# Patient Record
Sex: Female | Born: 1960 | Race: Black or African American | Hispanic: No | Marital: Single | Smoking: Former smoker
Health system: Southern US, Community
[De-identification: ages and names within clinical notes are randomized; demographics above are authoritative.]

## PROBLEM LIST (undated history)

## (undated) DIAGNOSIS — I10 Essential (primary) hypertension: Secondary | ICD-10-CM

## (undated) DIAGNOSIS — J42 Unspecified chronic bronchitis: Secondary | ICD-10-CM

## (undated) DIAGNOSIS — F32A Depression, unspecified: Secondary | ICD-10-CM

## (undated) DIAGNOSIS — E049 Nontoxic goiter, unspecified: Secondary | ICD-10-CM

## (undated) DIAGNOSIS — E039 Hypothyroidism, unspecified: Secondary | ICD-10-CM

## (undated) DIAGNOSIS — D251 Intramural leiomyoma of uterus: Secondary | ICD-10-CM

## (undated) DIAGNOSIS — Z8679 Personal history of other diseases of the circulatory system: Secondary | ICD-10-CM

## (undated) DIAGNOSIS — M8580 Other specified disorders of bone density and structure, unspecified site: Secondary | ICD-10-CM

## (undated) DIAGNOSIS — J189 Pneumonia, unspecified organism: Secondary | ICD-10-CM

## (undated) DIAGNOSIS — F419 Anxiety disorder, unspecified: Secondary | ICD-10-CM

## (undated) DIAGNOSIS — R0600 Dyspnea, unspecified: Secondary | ICD-10-CM

## (undated) DIAGNOSIS — E785 Hyperlipidemia, unspecified: Secondary | ICD-10-CM

## (undated) DIAGNOSIS — E079 Disorder of thyroid, unspecified: Secondary | ICD-10-CM

## (undated) HISTORY — DX: Unspecified chronic bronchitis: J42

## (undated) HISTORY — PX: ABDOMINAL HYSTERECTOMY: SHX81

## (undated) HISTORY — DX: Disorder of thyroid, unspecified: E07.9

## (undated) HISTORY — PX: REDUCTION MAMMAPLASTY: SUR839

## (undated) HISTORY — DX: Hyperlipidemia, unspecified: E78.5

## (undated) HISTORY — DX: Intramural leiomyoma of uterus: D25.1

## (undated) HISTORY — DX: Other specified disorders of bone density and structure, unspecified site: M85.80

## (undated) HISTORY — PX: TUBAL LIGATION: SHX77

## (undated) HISTORY — DX: Personal history of other diseases of the circulatory system: Z86.79

## (undated) HISTORY — DX: Nontoxic goiter, unspecified: E04.9

## (undated) HISTORY — PX: BUNIONECTOMY: SHX129

---
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
## 2008-10-21 HISTORY — PX: CARDIAC ELECTROPHYSIOLOGY STUDY AND ABLATION: SHX1294

## 2017-01-20 HISTORY — PX: LAPAROSCOPIC TOTAL HYSTERECTOMY: SUR800

## 2017-04-24 ENCOUNTER — Ambulatory Visit (INDEPENDENT_AMBULATORY_CARE_PROVIDER_SITE_OTHER): Payer: BLUE CROSS/BLUE SHIELD | Admitting: Endocrinology

## 2017-04-24 ENCOUNTER — Encounter: Payer: Self-pay | Admitting: Endocrinology

## 2017-04-24 DIAGNOSIS — Z0000 Encounter for general adult medical examination without abnormal findings: Secondary | ICD-10-CM | POA: Insufficient documentation

## 2017-04-24 DIAGNOSIS — E049 Nontoxic goiter, unspecified: Secondary | ICD-10-CM | POA: Insufficient documentation

## 2017-04-24 DIAGNOSIS — R1900 Intra-abdominal and pelvic swelling, mass and lump, unspecified site: Secondary | ICD-10-CM

## 2017-04-24 DIAGNOSIS — N390 Urinary tract infection, site not specified: Secondary | ICD-10-CM

## 2017-04-24 LAB — HEPATIC FUNCTION PANEL
ALBUMIN: 3.8 g/dL (ref 3.5–5.2)
ALT: 10 U/L (ref 0–35)
AST: 17 U/L (ref 0–37)
Alkaline Phosphatase: 92 U/L (ref 39–117)
BILIRUBIN TOTAL: 0.4 mg/dL (ref 0.2–1.2)
Bilirubin, Direct: 0 mg/dL (ref 0.0–0.3)
TOTAL PROTEIN: 6.9 g/dL (ref 6.0–8.3)

## 2017-04-24 LAB — CBC WITH DIFFERENTIAL/PLATELET
BASOS ABS: 0 10*3/uL (ref 0.0–0.1)
BASOS PCT: 0.7 % (ref 0.0–3.0)
EOS ABS: 0.2 10*3/uL (ref 0.0–0.7)
Eosinophils Relative: 4.5 % (ref 0.0–5.0)
HCT: 42 % (ref 36.0–46.0)
HEMOGLOBIN: 14.5 g/dL (ref 12.0–15.0)
LYMPHS PCT: 38.2 % (ref 12.0–46.0)
Lymphs Abs: 1.8 10*3/uL (ref 0.7–4.0)
MCHC: 34.5 g/dL (ref 30.0–36.0)
MCV: 96.9 fl (ref 78.0–100.0)
MONO ABS: 0.3 10*3/uL (ref 0.1–1.0)
Monocytes Relative: 7.2 % (ref 3.0–12.0)
Neutro Abs: 2.3 10*3/uL (ref 1.4–7.7)
Neutrophils Relative %: 49.4 % (ref 43.0–77.0)
Platelets: 177 10*3/uL (ref 150.0–400.0)
RBC: 4.33 Mil/uL (ref 3.87–5.11)
RDW: 12.8 % (ref 11.5–15.5)
WBC: 4.8 10*3/uL (ref 4.0–10.5)

## 2017-04-24 LAB — URINALYSIS, ROUTINE W REFLEX MICROSCOPIC
Nitrite: NEGATIVE
URINE GLUCOSE: NEGATIVE
UROBILINOGEN UA: 1 (ref 0.0–1.0)
pH: 6 (ref 5.0–8.0)

## 2017-04-24 LAB — BASIC METABOLIC PANEL
BUN: 16 mg/dL (ref 6–23)
CALCIUM: 9.6 mg/dL (ref 8.4–10.5)
CO2: 30 meq/L (ref 19–32)
CREATININE: 0.89 mg/dL (ref 0.40–1.20)
Chloride: 107 mEq/L (ref 96–112)
GFR: 84.91 mL/min (ref >60.00)
Glucose, Bld: 107 mg/dL — ABNORMAL HIGH (ref 70–99)
Potassium: 4.2 mEq/L (ref 3.5–5.1)
Sodium: 143 mEq/L (ref 135–145)

## 2017-04-24 LAB — LIPID PANEL
CHOL/HDL RATIO: 4
Cholesterol: 202 mg/dL — ABNORMAL HIGH (ref 0–200)
HDL: 54.2 mg/dL (ref >39.00)
LDL Cholesterol: 129 mg/dL — ABNORMAL HIGH (ref 0–99)
NONHDL: 147.84
Triglycerides: 93 mg/dL (ref 0.0–149.0)
VLDL: 18.6 mg/dL (ref 0.0–40.0)

## 2017-04-24 LAB — TSH: TSH: 0.82 u[IU]/mL (ref 0.35–4.50)

## 2017-04-24 NOTE — Patient Instructions (Addendum)
Let's check a CT scan of the abdomen, and an ultrasound of the thyroid.  you will receive a phone call, about a day and time for an appointment.   blood tests are requested for you today.  We'll let you know about the results.   please call [phone] [place] Comment [place] Ropes), to get an appointment with a new primary doctor.   Please sign a release of information, for the past treatment at [facility].

## 2017-04-24 NOTE — Progress Notes (Signed)
Subjective:    Patient ID: Ariel [name], female    DOB: 1960/12/08, 56 y.o.   MRN: [id number]  HPI Pt is self-referred, for goiter.  Pt says this was dx'ed in 2007, when pt lived in [place].  she has no h/o XRT or surgery to the neck.  She was rx'ed synthroid, but has not recently taken.  She has slight swelling at the ant neck, and assoc fatigue.   No past medical history on file.  No past surgical history on file.  Social History   Social History  . Marital status: Single    Spouse name: N/A  . Number of children: N/A  . Years of education: N/A   Occupational History  . Not on file.   Social History Main Topics  . Smoking status: Current Some Day Smoker  . Smokeless tobacco: Never Used  . Alcohol use Yes  . Drug use: Unknown  . Sexual activity: Not on file   Other Topics Concern  . Not on file   Social History Narrative  . No narrative on file    No current outpatient prescriptions on file prior to visit.   No current facility-administered medications on file prior to visit.     No Known Allergies  Family History  Problem Relation Age of Onset  . Thyroid disease Maternal Aunt     BP 132/86   Pulse 94   Ht 5' 3.75" (1.619 m)   Wt 147 lb (66.7 kg)   SpO2 96%   BMI 25.43 kg/m    Review of Systems Denies weight change, neck pain, diplopia, chest pain, sob, cough, diarrhea, itching, flushing, edepression, cold intolerance, numbness, and rhinorrhea.   She has intermitt headache, (solid=liquid) dysphagia, easy bruising, hoarseness, weight gain, and mild depression    Objective:   Physical Exam VS: see vs page GEN: no distress HEAD: head: no deformity eyes: no periorbital swelling, no proptosis external nose and ears are normal mouth: no lesion seen NECK: thyroid is 5-10 times normal size (L>R) CHEST WALL: no deformity LUNGS: clear to auscultation CV: reg rate and rhythm, no murmur ABD: large mass, nontender, occupying most of the abdomen.     MUSCULOSKELETAL: muscle bulk and strength are grossly normal.  no obvious joint swelling.  gait is normal and steady EXTEMITIES: no deformity.  no ulcer on the feet.  feet are of normal color and temp.  no edema PULSES: dorsalis pedis intact bilat.  no carotid bruit NEURO:  cn 2-12 grossly intact.   readily moves all 4's.  sensation is intact to touch on the feet SKIN:  Normal texture and temperature.  No rash or suspicious lesion is visible.   NODES:  None palpable at the neck PSYCH: alert, well-oriented.  Does not appear anxious nor depressed.   Lab Results  Component Value Date   TSH 0.82 04/24/2017   UA pos for UTI.  Lab Results  Component Value Date   CHOL 202 (H) 04/24/2017   HDL 54.20 04/24/2017   LDLCALC 129 (H) 04/24/2017   TRIG 93.0 04/24/2017   CHOLHDL 4 04/24/2017      Assessment & Plan:  abd mass, new, uncertain etiology.  severe risk of malignancy.   Goiter, new to me: euthyroid.  I told pt that she does not need to resume synthroid.   UTI: ? relationship to abd mass.   Dyslipidemia: diet is advised.   Patient Instructions  Let's check a CT scan of the abdomen, and an ultrasound  of the thyroid.  you will receive a phone call, about a day and time for an appointment.   blood tests are requested for you today.  We'll let you know about the results.   please call [phone] [place] Comment [place] Ropes), to get an appointment with a new primary doctor.   Please sign a release of information, for the past treatment at [facility].

## 2017-05-08 ENCOUNTER — Other Ambulatory Visit: Payer: Self-pay | Admitting: Endocrinology

## 2017-05-08 ENCOUNTER — Telehealth: Payer: Self-pay

## 2017-05-08 ENCOUNTER — Telehealth: Payer: Self-pay | Admitting: Endocrinology

## 2017-05-08 ENCOUNTER — Ambulatory Visit
Admission: RE | Admit: 2017-05-08 | Discharge: 2017-05-08 | Disposition: A | Discharge status: Home / Self Care | Payer: BLUE CROSS/BLUE SHIELD | Source: Ambulatory Visit | Attending: Endocrinology | Admitting: Endocrinology

## 2017-05-08 DIAGNOSIS — R1900 Intra-abdominal and pelvic swelling, mass and lump, unspecified site: Secondary | ICD-10-CM

## 2017-05-08 DIAGNOSIS — E049 Nontoxic goiter, unspecified: Secondary | ICD-10-CM

## 2017-05-08 MED ORDER — IOPAMIDOL (ISOVUE-300) INJECTION 61%
100.0000 mL | Freq: Once | INTRAVENOUS | Status: AC | PRN
  Administered 2017-05-08: 100 mL via INTRAVENOUS

## 2017-05-08 NOTE — Telephone Encounter (Signed)
please call patient: We got CT results.  The uterus is enlarged, but I can't tell for sure.  Please see a gynecology specialist.  you will receive a phone call, about a day and time for an appointment

## 2017-05-08 NOTE — Telephone Encounter (Signed)
Rad Management consultant reached and call report from CT abdomen and pelvis submitted to Dr. Loanne Drilling to review.

## 2017-05-08 NOTE — Telephone Encounter (Signed)
Judson Roch called in a report about the CT abdomen and pelvis. Results: Markedly enlarged uterus along superior and anterior aspects of the uterus. There is a mix attenuation mass measuring 12.2 x 13.8 x 9.5 cm. There is a more uniformly solid mass in the mid- uterus measuring 5.4 x 6.3 x 6.8 cm. The lesions most likely represent leiomyomas. All the more complex superior mass could have a degree of sarcomatous degeneration.

## 2017-05-08 NOTE — Telephone Encounter (Signed)
[place], rad Environmental consultant, needs a call back to go over imaging results.  Thank you,  -LL

## 2017-05-08 NOTE — Telephone Encounter (Signed)
Patient notified of message and voiced understanding.  

## 2017-05-28 NOTE — Progress Notes (Signed)
HPI:  Ariel [name] is here to establish care. Reports she has not had a primary care doctor in several years as has not had insurance.   Has the following chronic problems that require follow up and concerns today:  Hypertension and lower extremity edema: -Chronic -Reports was on hydrochlorothiazide for years, then ran out of insurance and has not taken this medicine in several years -Denies chest pain, shortness of breath, headaches or vision changes  Tobacco use: -Has smoked since she was 56 years old -4 cigarettes per day for many years -Is interested in quitting -Feels can quit -Likes to smoke when she is stressed -Denies shortness of breath, cough hemoptysis or recent lung issues  Stress/Cognitive fog: -for several months to a year or more -mild depressed mood, irritability, emotional lability, cog fog -enjoys her job but stressful -denies: SI, hallucinations, panic, manic symptoms  Thyroid goiter: -seeing Dr. Loanne Drilling for this  Mild Hyperlipidemia/mild hyperglycemia: -on labs with Dr. Loanne Drilling -Wants to work on lifestyle changes - admits a very poor diet and no regular exercise currently  Enlarged Uterus: -on CT done with Dr. 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 Chronic bronchitis ([place])   . Goiter   . History of hypertension   . Hyperlipidemia   . Thyroid disease     Past Surgical History:  Procedure Laterality Date  .  CARDIAC ELECTROPHYSIOLOGY STUDY AND ABLATION    . TONSILLECTOMY  1967  . TUBAL LIGATION     X2, REVERSAL     Family History  Problem Relation Age of Onset  . Thyroid disease Maternal Aunt   . Hypertension Mother   . Renal Disease Mother   . Diabetes Father   . Hypertension Father   . Renal Disease Father   . Stroke Maternal Grandmother   . Cancer Paternal Grandmother        ?    Social History   Social History  . Marital status: Single    Spouse name: N/A  . Number of children: N/A  . Years of education: N/A   Social History Main Topics  . Smoking status: Current Every Day Smoker  . Smokeless tobacco: Never Used     Comment: 4 CIGS A DAY  . Alcohol use Yes     Comment: OCC  . Drug use: Unknown  . Sexual activity: Yes    Partners: Male     Comment: 1ST INTERCOURSE- 27, PARTNERS- 52, CURRENT PARTNER- 3.5 YRS    Other Topics Concern  . 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 Christian, no church currenty      Lifestyle: "poor diet"; no exercise        Current Outpatient Prescriptions:  .  hydrochlorothiazide (HYDRODIURIL) 25 MG tablet, Take 1 tablet (25 mg total) by mouth daily., Disp: 90 tablet, Rfl: 3  EXAM:  Vitals:   05/29/17 1320  BP: (!) 142/92  Pulse: 85  Temp: 98.4 F (36.9 C)    Body mass index is 26 kg/m.  GENERAL: vitals reviewed and listed above, alert, oriented, appears well hydrated and in no acute distress  HEENT: atraumatic, conjunttiva clear, no obvious abnormalities on inspection of external nose and ears  NECK: no obvious masses on inspection  LUNGS: clear to auscultation bilaterally, no wheezes, rales or rhonchi, good air movement  CV: HRRR, no peripheral edema  MS: moves all extremities without noticeable abnormality  PSYCH: pleasant and cooperative, no obvious depression or anxiety  ASSESSMENT AND PLAN:  Discussed the  following assessment and plan:  Hypertension, unspecified type  Mood disorder ([place])  Hyperlipidemia, unspecified hyperlipidemia type  Hyperglycemia - Plan: Hemoglobin A1c  Tobacco use  Abdominal mass, unspecified abdominal location  Goiter  Aortic atherosclerosis ([facility]) -We reviewed the PMH, PSH, FH, SH, Meds and Allergies. -We provided refills for any medications we will prescribe as needed. -We addressed current concerns per orders and patient instructions.Opted to try cognitive behavioral therapy for the mood issues, restart hydrochlorothiazide for the blood pressure, work on smoking cessation, healthy diet and regular exercise. Follow-up in 2-4 weeks. -We have asked for records for pertinent exams, studies, vaccines and notes from previous providers. -We have advised patient to follow up per instructions below. -We will plan to check a hemoglobin A1c and BMP at her follow-up. Also will need to discuss a statin and aspirin given her history of aortic atherosclerosis - noted as incidental finding on recent CT scan. -We discussed that we will need to do a preventive visit and address her preventive care measures once we taking care of her acute issues. -Patient advised to return or notify a doctor immediately if symptoms worsen or persist or new concerns arise.  Patient Instructions  Before you leave: -follow up: 2-3 weeks   Call today to set up therapy for the emotional and cognitive concerns.  Start the Hydrochlorothiazide 25 mg every morning.  We will plan to recheck kidney function and diabetes lab and discuss other treatment options for cholesterol at your next visit.  Please quit smoking, eat a healthy diet and get regular exercise.   We recommend the following healthy lifestyle for LIFE: 1) Small portions.   Tip: eat off of a salad plate instead of a dinner plate.  Tip: It is ok to feel hungry after a meal - that likely means you ate an appropriate portion.  Tip: if  you need more or a snack choose fruits, veggies and/or a handful of nuts or seeds.  2) Eat a healthy clean diet.   TRY TO EAT: -at least 5-7 servings of low sugar vegetables per day (not corn, potatoes or bananas.) -berries are the best choice if you wish to eat fruit.   -lean meets (fish, chicken or [place] breasts) -vegan proteins for some meals - beans or tofu, whole grains, nuts and seeds -Replace bad fats with good fats - good fats include: fish, nuts and seeds, canola oil, olive oil -small amounts of low fat or non fat dairy -small amounts of100 % whole grains - check the lables  AVOID: -SUGAR, sweets, anything with added sugar, corn syrup or sweeteners -if you must have a sweetener, small amounts of stevia may be best -sweetened beverages -simple starches (rice, bread, potatoes, pasta, chips, etc - small amounts of 100% whole grains are ok) -red meat, pork, butter -fried foods, fast food, processed food, excessive dairy, eggs and coconut.  3)Get  at least 150 minutes of sweaty aerobic exercise per week.  4)Reduce stress - consider counseling, meditation and relaxation to balance other aspects of your life.      Colin Benton R.

## 2017-05-29 ENCOUNTER — Ambulatory Visit (INDEPENDENT_AMBULATORY_CARE_PROVIDER_SITE_OTHER): Payer: BLUE CROSS/BLUE SHIELD | Admitting: Family Medicine

## 2017-05-29 ENCOUNTER — Encounter: Payer: Self-pay | Admitting: Obstetrics & Gynecology

## 2017-05-29 ENCOUNTER — Encounter: Payer: Self-pay | Admitting: Family Medicine

## 2017-05-29 ENCOUNTER — Ambulatory Visit (INDEPENDENT_AMBULATORY_CARE_PROVIDER_SITE_OTHER): Payer: BLUE CROSS/BLUE SHIELD | Admitting: Obstetrics & Gynecology

## 2017-05-29 VITALS — Ht 63.25 in | Wt 148.0 lb

## 2017-05-29 VITALS — BP 142/92 | HR 85 | Temp 98.4°F | Ht 63.5 in | Wt 149.1 lb

## 2017-05-29 DIAGNOSIS — E049 Nontoxic goiter, unspecified: Secondary | ICD-10-CM | POA: Diagnosis not present

## 2017-05-29 DIAGNOSIS — Z113 Encounter for screening for infections with a predominantly sexual mode of transmission: Secondary | ICD-10-CM

## 2017-05-29 DIAGNOSIS — N898 Other specified noninflammatory disorders of vagina: Secondary | ICD-10-CM

## 2017-05-29 DIAGNOSIS — R1900 Intra-abdominal and pelvic swelling, mass and lump, unspecified site: Secondary | ICD-10-CM | POA: Diagnosis not present

## 2017-05-29 DIAGNOSIS — R1909 Other intra-abdominal and pelvic swelling, mass and lump: Secondary | ICD-10-CM | POA: Diagnosis not present

## 2017-05-29 DIAGNOSIS — Z720 Tobacco use: Secondary | ICD-10-CM

## 2017-05-29 DIAGNOSIS — I10 Essential (primary) hypertension: Secondary | ICD-10-CM | POA: Insufficient documentation

## 2017-05-29 DIAGNOSIS — Z780 Asymptomatic menopausal state: Secondary | ICD-10-CM

## 2017-05-29 DIAGNOSIS — R739 Hyperglycemia, unspecified: Secondary | ICD-10-CM | POA: Insufficient documentation

## 2017-05-29 DIAGNOSIS — N852 Hypertrophy of uterus: Secondary | ICD-10-CM | POA: Diagnosis not present

## 2017-05-29 DIAGNOSIS — E785 Hyperlipidemia, unspecified: Secondary | ICD-10-CM | POA: Insufficient documentation

## 2017-05-29 DIAGNOSIS — Z01411 Encounter for gynecological examination (general) (routine) with abnormal findings: Secondary | ICD-10-CM | POA: Diagnosis not present

## 2017-05-29 DIAGNOSIS — F39 Unspecified mood [affective] disorder: Secondary | ICD-10-CM | POA: Diagnosis not present

## 2017-05-29 DIAGNOSIS — N858 Other specified noninflammatory disorders of uterus: Secondary | ICD-10-CM

## 2017-05-29 DIAGNOSIS — Z1151 Encounter for screening for human papillomavirus (HPV): Secondary | ICD-10-CM

## 2017-05-29 DIAGNOSIS — I700 Atherosclerosis of aorta: Secondary | ICD-10-CM | POA: Diagnosis not present

## 2017-05-29 HISTORY — DX: Tobacco use: Z72.0

## 2017-05-29 MED ORDER — HYDROCHLOROTHIAZIDE 25 MG PO TABS: 25.0000 mg | ORAL_TABLET | Freq: Every day | ORAL | 3 refills | Status: DC

## 2017-05-29 MED ORDER — TINIDAZOLE 500 MG PO TABS: 2.0000 g | ORAL_TABLET | Freq: Every day | ORAL | 0 refills | Status: DC

## 2017-05-29 NOTE — Patient Instructions (Signed)
1. Encounter for gynecological examination with abnormal finding Gyn exam with very enlarged, irregular Uterus.  Pap/HPV, Gono-Chlam done.  Breasts wnl.  Will schedule screening Mammo.  2. Menopause present Symptomatic with severe hot flashes/night sweats.  No HRT.  No PMB.  Will address the large Uterine masses first, then consider treatment if no CI.  3. Uterine mass Large complex Uterine masses on CT scan.  The largest mass is more complex, max diameter 13.8 cm.  Degenerated necrotizing Myoma?  Very possibly a Sarcoma given the presentation, rapid growth per patient and CT scan findings.  Will complete with a Pelvic/Abdo [place] evaluation ASAP and decide on proceeding with TAH/BSO myself or referring to Gyneco-Oncology.  - US Transvaginal Non-OB; Future  4. Vaginal discharge Increased vaginal discharge with odor c/w Bacterial Vaginosis.  Tindamax treatment prescribed, usage discussed.  Ariel [name], it was a pleasure to meet you today!  I will inform you of your results as soon as available and see you back as soon as possible for the Pelvic US.

## 2017-05-29 NOTE — Progress Notes (Addendum)
Ariel [name] 1961-08-22 [id number]   History:    56 y.o. G2P2  Children in 20's and 30's.  S/P BT/S.  Boyfriend x 3 1/2 years.  In [place] x about 4 years.  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  RP:  New patient presenting for annual gyn exam and large uterine masses  HPI:  Abdominal mass felt on exam by Dr Loanne Drilling who ordered an Abdominopelvic CT done on 05/08/2017 showing 2 large complex Uterine masses.  In retrospective, patient says that she felt her abdomen grew bigger rapidly over the past year.  She felt some discomfort, but not complaining of severe abdominopelvic pain.  No pain with IC.  No PMB.  She had her last menstrual period 01/2015.  No HRT.  Severe hot flashes/night sweats currently.  Mictions normal, but patient feels that she needs to drink more water, urinating smaller amounts of darker urine at times.  Recent labs Creat normal.  BMs wnl.  Patient has a large Goiter, [place] 05/08/2017, for which a Bx is planned.  Recent TSH wnl.  Labs done 04/24/2017.  CBC wnl.  CMP wnl.  Glucose 107.  FLP high LDL and Triglycerides.  Visit with Dr Maudie Mercury scheduled soon.  Past medical history,surgical history, family history and social history were all reviewed and documented in the EPIC chart.  Gynecologic History Patient's last menstrual period was 05/30/2015. Contraception: post menopausal status and tubal ligation Last Pap: Overdue. Results were: normal in the past per patient Last mammogram: 2002. Results were: normal per patient No Colonoscopy yet  Obstetric History OB History  Gravida Para Term Preterm AB Living  2 2       2   SAB TAB Ectopic Multiple Live Births               # Outcome Date GA Lbr Len/2nd Weight Sex Delivery Anes PTL Lv  2 Para           1 Para                ROS: A ROS was performed and pertinent positives and negatives are included in the history.  GENERAL: No fevers or chills. HEENT: No change in vision, no earache, sore throat or sinus congestion.  NECK: No pain or stiffness. CARDIOVASCULAR: No chest pain or pressure. No palpitations. PULMONARY: No shortness of breath, cough or wheeze. GASTROINTESTINAL: No abdominal pain, nausea, vomiting or diarrhea, melena or bright red blood per rectum. GENITOURINARY: No urinary frequency, urgency, hesitancy or dysuria. MUSCULOSKELETAL: No joint or muscle pain, no back pain, no recent trauma. DERMATOLOGIC: No rash, no itching, no lesions. ENDOCRINE: No polyuria, polydipsia, no heat or cold intolerance. No recent change in weight. HEMATOLOGICAL: No anemia or easy bruising or bleeding. NEUROLOGIC: No headache, seizures, numbness, tingling or weakness. PSYCHIATRIC: No depression, no loss of interest in normal activity or change in sleep pattern.     Exam:   Ht 5' 3.25" (1.607 m)   Wt 148 lb (67.1 kg)   LMP 05/30/2015   BMI 26.01 kg/m   Body mass index is 26.01 kg/m.  General appearance : Well developed well nourished female. No acute distress HEENT: Eyes: no retinal hemorrhage or exudates,  Neck supple, trachea midline, no carotid bruits, no thyroidmegaly Lungs: Clear to auscultation, no rhonchi or wheezes, or rib retractions  Heart: Regular rate and rhythm, no murmurs or gallops Breast:Examined in sitting and supine position were symmetrical in appearance, no palpable masses or tenderness,  no  skin retraction, no nipple inversion, no nipple discharge, no skin discoloration, no axillary or supraclavicular lymphadenopathy Abdomen: Large palpable mass originating from pelvis.  Mild tenderness, no rebound or guarding Extremities: no edema or skin discoloration or tenderness  Pelvic: Vulva normal  Bartholin, Urethra, Skene Glands: Within normal limits             Vagina: No gross lesions.  Increased vaginal discharge with odor typical of BV.  Cervix: No gross lesions.  Pap/HPV, Gono-Chlam done.  Uterus  Markedly enlarged and irregular, well above the umbilicus, a few inches below the Rt lower rib cage.   Very little mobility.  Mildly tender.  Adnexa  Without masses or tenderness  Anus and perineum  normal  Abdominopelvic CT 05/08/2017:   Reproductive: Uterus is anteverted. The uterus is markedly enlarged. There is a dominant complex mass arising from the anterior superior uterus measuring 12.2 x 13.8 x 9.5 cm. More inferiorly in the uterus, there is a second mass measuring 5.4 x 6.3 x 6.8 cm. These masses are felt to represent dominant leiomyomas within the uterus. No pelvic masses are seen apart from the uterus.  Assessment/Plan:  56 y.o. female for annual exam   1. Encounter for gynecological examination with abnormal finding Gyn exam with very enlarged, irregular Uterus.  Pap/HPV, Gono-Chlam done.  Breasts wnl.  Will schedule screening Mammo.  2. Menopause present Symptomatic with severe hot flashes/night sweats.  No HRT.  No PMB.  Will address the large Uterine masses first, then consider treatment if no CI.  3. Uterine mass Large complex Uterine masses on CT scan.  The largest mass is more complex, max diameter 13.8 cm.  Degenerated necrotizing Myoma?  Very possibly a Sarcoma given the presentation, rapid growth per patient and CT scan findings.  Will complete with a Pelvic/Abdo [place] evaluation ASAP and decide on proceeding with TAH/BSO myself or referring to Gyneco-Oncology.  - US Transvaginal Non-OB; Future  4. Vaginal discharge Increased vaginal discharge with odor c/w Bacterial Vaginosis.  Tindamax treatment prescribed, usage discussed.  Counseling on above issues >50% x 30 minutes.  Princess Bruins MD, 9:19 AM 05/29/2017

## 2017-05-29 NOTE — Patient Instructions (Signed)
Before you leave: -follow up: 2-3 weeks   Call today to set up therapy for the emotional and cognitive concerns.  Start the Hydrochlorothiazide 25 mg every morning.  We will plan to recheck kidney function and diabetes lab and discuss other treatment options for cholesterol at your next visit.  Please quit smoking, eat a healthy diet and get regular exercise.   We recommend the following healthy lifestyle for LIFE: 1) Small portions.   Tip: eat off of a salad plate instead of a dinner plate.  Tip: It is ok to feel hungry after a meal - that likely means you ate an appropriate portion.  Tip: if you need more or a snack choose fruits, veggies and/or a handful of nuts or seeds.  2) Eat a healthy clean diet.   TRY TO EAT: -at least 5-7 servings of low sugar vegetables per day (not corn, potatoes or bananas.) -berries are the best choice if you wish to eat fruit.   -lean meets (fish, chicken or [place] breasts) -vegan proteins for some meals - beans or tofu, whole grains, nuts and seeds -Replace bad fats with good fats - good fats include: fish, nuts and seeds, canola oil, olive oil -small amounts of low fat or non fat dairy -small amounts of100 % whole grains - check the lables  AVOID: -SUGAR, sweets, anything with added sugar, corn syrup or sweeteners -if you must have a sweetener, small amounts of stevia may be best -sweetened beverages -simple starches (rice, bread, potatoes, pasta, chips, etc - small amounts of 100% whole grains are ok) -red meat, pork, butter -fried foods, fast food, processed food, excessive dairy, eggs and coconut.  3)Get at least 150 minutes of sweaty aerobic exercise per week.  4)Reduce stress - consider counseling, meditation and relaxation to balance other aspects of your life.

## 2017-05-29 NOTE — Addendum Note (Signed)
Addended by: Thurnell Garbe A on: 05/29/2017 11:52 AM   Modules accepted: Orders

## 2017-06-03 ENCOUNTER — Other Ambulatory Visit: Payer: Self-pay | Admitting: Obstetrics & Gynecology

## 2017-06-03 DIAGNOSIS — R1900 Intra-abdominal and pelvic swelling, mass and lump, unspecified site: Secondary | ICD-10-CM

## 2017-06-04 ENCOUNTER — Ambulatory Visit (INDEPENDENT_AMBULATORY_CARE_PROVIDER_SITE_OTHER): Payer: BLUE CROSS/BLUE SHIELD | Admitting: Obstetrics & Gynecology

## 2017-06-04 ENCOUNTER — Telehealth: Payer: Self-pay | Admitting: *Deleted

## 2017-06-04 ENCOUNTER — Ambulatory Visit (INDEPENDENT_AMBULATORY_CARE_PROVIDER_SITE_OTHER): Payer: BLUE CROSS/BLUE SHIELD

## 2017-06-04 ENCOUNTER — Encounter: Payer: Self-pay | Admitting: Obstetrics & Gynecology

## 2017-06-04 ENCOUNTER — Other Ambulatory Visit: Payer: Self-pay | Admitting: Obstetrics & Gynecology

## 2017-06-04 VITALS — BP 134/90

## 2017-06-04 DIAGNOSIS — N859 Noninflammatory disorder of uterus, unspecified: Secondary | ICD-10-CM

## 2017-06-04 DIAGNOSIS — D252 Subserosal leiomyoma of uterus: Secondary | ICD-10-CM

## 2017-06-04 DIAGNOSIS — R1900 Intra-abdominal and pelvic swelling, mass and lump, unspecified site: Secondary | ICD-10-CM | POA: Diagnosis not present

## 2017-06-04 DIAGNOSIS — D250 Submucous leiomyoma of uterus: Secondary | ICD-10-CM | POA: Diagnosis not present

## 2017-06-04 DIAGNOSIS — D251 Intramural leiomyoma of uterus: Secondary | ICD-10-CM | POA: Diagnosis not present

## 2017-06-04 DIAGNOSIS — N858 Other specified noninflammatory disorders of uterus: Secondary | ICD-10-CM

## 2017-06-04 LAB — PAP IG, CT-NG NAA, HPV HIGH-RISK
CHLAMYDIA PROBE AMP: NOT DETECTED
GC Probe Amp: NOT DETECTED
HPV DNA High Risk: NOT DETECTED

## 2017-06-04 NOTE — Telephone Encounter (Signed)
-----   Message from Princess Bruins, MD sent at 06/04/2017 10:48 AM EDT ----- Regarding: Refer to Gyneco-Onco Refer to [facility] as soon as possible.  Very large rapidly growing uterine mass.  High risk of Sarcoma.  Best time to reach her is between 4 and 5 pm.  Can go to [facility] if faster.

## 2017-06-04 NOTE — Progress Notes (Signed)
    Margaret Cockerill Mar 11, 1961 [id number]        56 y.o.  G2P2   RP:  Pelvic US  HPI:   No change x last visit.  As per last visit 05/29/2017:  Abdominal mass felt on exam by Dr Loanne Drilling who ordered an Abdominopelvic CT done on 05/08/2017 showing 2 large complex Uterine masses.  In retrospective, patient says that she felt her abdomen grew bigger rapidly over the past year.  She felt some discomfort, but not complaining of severe abdominopelvic pain.  No pain with IC.  No PMB.  She had her last menstrual period 01/2015.  No HRT.  Severe hot flashes/night sweats currently.    Past medical history,surgical history, problem list, medications, allergies, family history and social history were all reviewed and documented in the EPIC chart.  Directed ROS with pertinent positives and negatives documented in the history of present illness/assessment and plan.  Exam:  Vitals:   06/04/17 1029  BP: 134/90   General appearance:  Normal  On Abdo/Gyn exam last visit: Uterus  Markedly enlarged and irregular, well above the umbilicus, a few inches below the Rt lower rib cage.  Very little mobility.  Mildly tender.  Adnexa  Without masses or tenderness.  CT scan 05/08/2017:  Reproductive: Uterus is anteverted. The uterus is markedly enlarged.  There is a dominant complex mass arising from the anterior superior uterus measuring 12.2 x 13.8 x 9.5 cm. More inferiorly in the uterus, there is a second mass measuring 5.4 x 6.3 x 6.8 cm. These masses are felt to represent dominant leiomyomas within the uterus.  No pelvic masses are seen apart from the uterus.  No adenopathy is appreciable abdomen or pelvis.  Pelvic US today:  T/V and T/A Enlarged Anteverted Uterus 14.3 x 10.9 x 8.75 cm with Intramural Fibroids 1.4 x 0.9 cm and 8.1 x 4.8 x 7.5 cm with calcification and partly Submucosal 3.2 x 1.9 cm.  Large solid mass arising from the fundus of the Uterus measuring 16 x 10 x 15.5 cm with areas of positive CFD, arterial  blood flow doppler.  Right Ovary not seen.  Left Ovary normal.  No FF in CDS.   Assessment/Plan:  56 y.o. G2P2   1. Uterine mass Probably rapidly growing heterogeneous large vascular uterine mass in a menopausal woman (No HRT).  Suspicious for Sarcoma.  All findings reviewed with patient.  Decision to refer to Gyneco-Oncology because of high risk of malignancy.  Patient explained that if the Uterine mass is not just a Fibroid, but is a cancer, a surgery with Gyneco-Onco would be preferable as LN sampling could be done.  Patient voiced understanding of plan and agreed.  2. Intramural, submucous, and subserous leiomyoma of uterus Fibroid Uterus.   Princess Bruins MD, 10:46 AM 06/04/2017

## 2017-06-04 NOTE — Telephone Encounter (Signed)
Left message for Gyn-oncology to call regarding scheduling new patient appointment.

## 2017-06-04 NOTE — Patient Instructions (Signed)
1. Uterine mass Probably rapidly growing heterogeneous large vascular uterine mass in a menopausal woman (No HRT).  Suspicious for Sarcoma.  All findings reviewed with patient.  Decision to refer to Gyneco-Oncology because of high risk of malignancy.  Patient explained that if the Uterine mass is not just a Fibroid, but is a cancer, a surgery with Gyneco-Onco would be preferable as LN sampling could be done.  Patient voiced understanding of plan and agreed.  2. Intramural, submucous, and subserous leiomyoma of uterus Fibroid Uterus.  Ariel [name], it was good to see you today!  I sent the message already to set the appointment to Gyneco-Oncology as soon as possible.  You will receive a phone call shortly.

## 2017-06-05 NOTE — Telephone Encounter (Signed)
Pt scheduled 06/11/17 @ 2:00pm with Dr.Gehrig at [facility] center, pt informed.

## 2017-06-10 NOTE — Progress Notes (Signed)
New Patient Consult Note: Gyn-Onc  Ariel [name] 56 y.o. female  CC:  Chief Complaint  Patient presents with  . Uterine mass    HPI: Patient is seen today in consultation at the request of Dr. Lavoie.  Patient is a very pleasant 56-year-old gravida 2 para 2 who thought that she was gaining weight over the past year but felt that was upper portion, she was eating. She was without insurance her. Time and did not seek medical care or attention. Ultimately went she received insurance she went to get her goiter checked. She had previously been on thyroid medication but was off of it when she lost her insurance. She was doing home iodine therapy which helped with her TSH but it did not help the size of the goiter. During that process her primary care physician Dr. Hannah Kim performed and abdominal examination mass was noted. She will return to CT scan that revealed:  CT scan 05/08/2017:   IMPRESSION: 1. Markedly enlarged uterus. Along the superior anterior aspect of the uterus, there is a mixed attenuation mass measuring 12.2 x 13.8x 9.5 cm. There is a more uniformly solid mass in the mid uterus measuring 5.4 x 6.3 x 6.8 cm. These lesions most likely represent leiomyomas, although the more complex superior mass could have a degree of sarcomatous degeneration. No other pelvic mass evident.  2. Occasional sigmoid diverticula without diverticulitis. No bowel wall thickening or bowel obstruction. No abscess. Appendix appears normal.  3.  No renal or ureteral calculus.  No hydronephrosis.  4.  Aortoiliac atherosclerosis.  5.  Small ventral hernia containing only fat.  6.  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  Large solid mass arising from the fundus of the Uterus measuring 16 x  10 x 15.5 cm with areas of positive CFD, arterial blood flow doppler.  Right Ovary not seen.  Left Ovary normal.  No FF in CDS.   She was subsequently referred to Dr. Lavoie who referred her to us. Other than now being more cognizant of her abdomen getting larger she relieves remained asymptomatic. She's been menopausal for about 2 years and never took any hormone replacement therapy. Occasionally she'll have some burning in her abdomen which she thought was a ulcer but it is resolved spontaneously. Occasionally she'll have some spasms on her right side that similarly resolved spontaneously. She states that her bowel habits are not regular. She blames this on poor eating. Her weight is up and she weighs about 147 pounds. She states her baseline was usually 136. Her last mammogram was in 2002. She's never had a colonoscopy. Her last Pap smear was Dr. Lavoie about 2 weeks ago. The only cancer in her family. Paternal grandmother with postmenopausal breast cancer. She is scheduled for a biopsy of her thyroid next week.  Review of Systems: Constitutional: Denies fever. No nausea but very sensitive to odors and smells. She thinks that she's gained about 11 pounds. Skin: No rash Cardiovascular: No chest pain, shortness of breath, + trace edema bilateral ankles, better now that she is taking her diuretics again Pulmonary: No cough Gastro Intestinal:  No nausea, vomiting, constipation, or diarrhea reported. Her bowels aren't as regular as she would like them to be and she attributes this to   not eating very well. Genitourinary: No frequency, urgency, or dysuria.  Denies vaginal bleeding and discharge.  Psychology: No changes  Current Meds:  Outpatient Encounter Prescriptions as of 06/11/2017  Medication Sig  . hydrochlorothiazide (HYDRODIURIL) 25 MG tablet Take 1 tablet (25 mg total) by mouth daily.   No facility-administered encounter medications on file as of 06/11/2017.     Allergy: No Known  Allergies  Social Hx:   Social History   Social History  . Marital status: Single    Spouse name: N/A  . Number of children: N/A  . Years of education: N/A   Occupational History  . Not on file.   Social History Main Topics  . Smoking status: Current Every Day Smoker  . Smokeless tobacco: Never Used     Comment: 4 CIGS A DAY  . Alcohol use Yes     Comment: OCC  . Drug use: Unknown  . Sexual activity: Yes    Partners: Male     Comment: 1ST INTERCOURSE- 18, PARTNERS- 22, CURRENT PARTNER- 3.5 YRS    Other Topics Concern  . 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 Christian, no church currenty      Lifestyle: "poor diet"; no exercise       Past Surgical Hx:  Past Surgical History:  Procedure Laterality Date  . CARDIAC ELECTROPHYSIOLOGY STUDY AND ABLATION    . TONSILLECTOMY  1967  . TUBAL LIGATION     X2, REVERSAL     Past Medical Hx:  Past Medical History:  Diagnosis Date  . Chronic bronchitis (HCC)   . Goiter   . History of hypertension   . Hyperlipidemia   . Thyroid disease     Oncology Hx:   No history exists.    Family Hx:  Family History  Problem Relation Age of Onset  . Thyroid disease Maternal Aunt   . Hypertension Mother   . Renal Disease Mother   . Diabetes Father   . Hypertension Father   . Renal Disease Father   . Stroke Maternal Grandmother   . Cancer Paternal Grandmother        ?    Vitals:  Blood pressure 137/86, pulse 90, temperature 98.3 F (36.8 C), temperature source Oral, resp. rate 16, height 5' 3.5" (1.613 m), weight 141 lb 14.4 oz (64.4 kg), last menstrual period 05/30/2015.  Physical Exam: Well-nourished well-developed female in no acute distress.  Neck: Supple, prominent goiter on her left side. Nontender.  Lungs: Clear to auscultation laterally.  Cardiac: Regular rate rhythm.  Abdomen: Soft, nontender,  nondistended. Palpable mass just above the level of the umbilicus which is firm and fills the midline. The patient was allowed to fill the mass today. There is no hepatomegaly. There is no fluid wave. There is no rebound or guarding.  Groins: No lymphadenopathy.  Extremities: No edema.  Pelvic: Actual genitalia within normal limits. The cervix is without lesions or discharge. There is no bleeding. Bimanual examination reveals a 16-18 week size abdominal pelvic mass that appears to be arising from the uterus. There is no nodularity. Rectal confirms. I cannot appreciate any adnexal masses.  Assessment/Plan: 56-year-old with an abdominal pelvic mass that appears to be arising from the uterus. She's had no bleeding. Therefore, in the absence of bleeding this could either be sarcoma or an otherwise degenerating myoma.   I discussed proceeding with total abdominal hysterectomy, bilateral salpingo-oophorectomy and frozen section with the patient. If this appears to be an epithelial type of malignancy that we would proceed with lymphadenectomy and appropriate staging. If is consistent with a sarcoma and there is no lymphadenopathy palpated, we would not necessarily proceed with lymphadenectomy as it is not indicated in uterine sarcoma.  She understands that any adjuvant therapy will depend on what the pathology reveals.   She was shown her CT images and found this to be very helpful. She wishes to proceed with surgery. Risks and benefits of surgery including but not limited to bleeding, infection, injury to surrounding organs, and thromboembolic disease were discussed with her. Her questions were elicited and answered to her satisfaction. She is tentatively scheduled for surgery for 06/26/2017 with Dr. Emma Rossi. She is aware that she'll have the opportunity to meet Dr. Rossi the morning of surgery. She knows that she can contact any of us with any questions prior to surgery.  She will contact her human  resources at work and will notify us if we need to complete any FMLA paperwork for her. She will most likely be going to [place] to recuperate after his surgery. That is where her children live.  We appreciate the opportunity to partner in the care of this very pleasant patient.  Dalena Plantz A., MD 06/11/2017, 2:48 PM 

## 2017-06-11 ENCOUNTER — Ambulatory Visit: Payer: BLUE CROSS/BLUE SHIELD | Attending: Gynecologic Oncology | Admitting: Gynecologic Oncology

## 2017-06-11 ENCOUNTER — Encounter: Payer: Self-pay | Admitting: Gynecologic Oncology

## 2017-06-11 VITALS — BP 137/86 | HR 90 | Temp 98.3°F | Resp 16 | Ht 63.5 in | Wt 141.9 lb

## 2017-06-11 DIAGNOSIS — E079 Disorder of thyroid, unspecified: Secondary | ICD-10-CM | POA: Insufficient documentation

## 2017-06-11 DIAGNOSIS — E785 Hyperlipidemia, unspecified: Secondary | ICD-10-CM | POA: Insufficient documentation

## 2017-06-11 DIAGNOSIS — Z841 Family history of disorders of kidney and ureter: Secondary | ICD-10-CM | POA: Diagnosis not present

## 2017-06-11 DIAGNOSIS — Z8249 Family history of ischemic heart disease and other diseases of the circulatory system: Secondary | ICD-10-CM | POA: Insufficient documentation

## 2017-06-11 DIAGNOSIS — R1900 Intra-abdominal and pelvic swelling, mass and lump, unspecified site: Secondary | ICD-10-CM | POA: Insufficient documentation

## 2017-06-11 DIAGNOSIS — N859 Noninflammatory disorder of uterus, unspecified: Secondary | ICD-10-CM | POA: Diagnosis present

## 2017-06-11 DIAGNOSIS — I10 Essential (primary) hypertension: Secondary | ICD-10-CM | POA: Diagnosis not present

## 2017-06-11 DIAGNOSIS — F17210 Nicotine dependence, cigarettes, uncomplicated: Secondary | ICD-10-CM | POA: Diagnosis not present

## 2017-06-11 DIAGNOSIS — Z833 Family history of diabetes mellitus: Secondary | ICD-10-CM | POA: Diagnosis not present

## 2017-06-11 DIAGNOSIS — N858 Other specified noninflammatory disorders of uterus: Secondary | ICD-10-CM

## 2017-06-11 NOTE — Patient Instructions (Addendum)
Preparing for your Surgery  Plan for surgery on June 26, 2017 with Dr. Everitt Amber at [facility] will be scheduled for an exploratory laparotomy, total abdominal hysterectomy, bilateral salpingo-oophorectomy, possible staging if cancer identified.  Pre-operative Testing -You will receive a phone call from presurgical testing at [facility] to arrange for a pre-operative testing appointment before your surgery.  This appointment normally occurs one to two weeks before your scheduled surgery.   -Bring your insurance card, copy of an advanced directive if applicable, medication list  -At that visit, you will be asked to sign a consent for a possible blood transfusion in case a transfusion becomes necessary during surgery.  The need for a blood transfusion is rare but having consent is a necessary part of your care.     -You should not be taking blood thinners or aspirin at least ten days prior to surgery unless instructed by your surgeon.  Day Before Surgery at [place] will be asked to take in a light diet the day before surgery.  Avoid carbonated beverages.  You will be advised to have nothing to eat or drink after midnight the evening before.     Eat a light diet the day before surgery.  Examples including soups, broths, toast, yogurt, mashed potatoes.  Things to avoid include carbonated beverages (fizzy beverages), raw fruits and raw vegetables, or beans.    If your bowels are filled with gas, your surgeon will have difficulty visualizing your pelvic organs which increases your surgical risks.  Your role in recovery Your role is to become active as soon as directed by your doctor, while still giving yourself time to heal.  Rest when you feel tired. You will be asked to do the following in order to speed your recovery:  - Cough and breathe deeply. This helps toclear and expand your lungs and can prevent pneumonia. You may be given a spirometer to  practice deep breathing. A staff member will show you how to use the spirometer. - Do mild physical activity. Walking or moving your legs help your circulation and body functions return to normal. A staff member will help you when you try to walk and will provide you with simple exercises. Do not try to get up or walk alone the first time. - Actively manage your pain. Managing your pain lets you move in comfort. We will ask you to rate your pain on a scale of zero to 10. It is your responsibility to tell your doctor or nurse where and how much you hurt so your pain can be treated.  Special Considerations -If you are diabetic, you may be placed on insulin after surgery to have closer control over your blood sugars to promote healing and recovery.  This does not mean that you will be discharged on insulin.  If applicable, your oral antidiabetics will be resumed when you are tolerating a solid diet.  -Your final pathology results from surgery should be available by the Friday after surgery and the results will be relayed to you when available.   Blood Transfusion Information WHAT IS A BLOOD TRANSFUSION? A transfusion is the replacement of blood or some of its parts. Blood is made up of multiple cells which provide different functions.  Red blood cells carry oxygen and are used for blood loss replacement.  White blood cells fight against infection.  Platelets control bleeding.  Plasma helps clot blood.  Other blood products are available for specialized needs, such  as hemophilia or other clotting disorders. BEFORE THE TRANSFUSION  Who gives blood for transfusions?   You may be able to donate blood to be used at a later date on yourself (autologous donation).  Relatives can be asked to donate blood. This is generally not any safer than if you have received blood from a stranger. The same precautions are taken to ensure safety when a relative's blood is donated.  Healthy volunteers who are  fully evaluated to make sure their blood is safe. This is blood bank blood. Transfusion therapy is the safest it has ever been in the practice of medicine. Before blood is taken from a donor, a complete history is taken to make sure that person has no history of diseases nor engages in risky social behavior (examples are intravenous drug use or sexual activity with multiple partners). The donor's travel history is screened to minimize risk of transmitting infections, such as malaria. The donated blood is tested for signs of infectious diseases, such as HIV and hepatitis. The blood is then tested to be sure it is compatible with you in order to minimize the chance of a transfusion reaction. If you or a relative donates blood, this is often done in anticipation of surgery and is not appropriate for emergency situations. It takes many days to process the donated blood. RISKS AND COMPLICATIONS Although transfusion therapy is very safe and saves many lives, the main dangers of transfusion include:   Getting an infectious disease.  Developing a transfusion reaction. This is an allergic reaction to something in the blood you were given. Every precaution is taken to prevent this. The decision to have a blood transfusion has been considered carefully by your caregiver before blood is given. Blood is not given unless the benefits outweigh the risks.   Abdominal Hysterectomy Abdominal hysterectomy is a surgery to remove your womb (uterus). The womb is the part of your body that holds a growing baby. You may need this procedure if:  You have cancer.  You have growths (tumors or fibroids) in your uterus.  You have long-term (chronic) pain.  You are bleeding.  Your womb has slipped down into your vagina.  You have a condition in which the tissue that lines the womb grows outside of its normal place.  You have an infection in your womb.  You have problems with your period.  You may also need other  reproductive parts removed. This will depend on why you need to have the surgery. What happens before the procedure? Staying hydrated Follow instructions from your doctor about hydration. This may include:  Up to 2 hours before the procedure - you may continue to drink clear liquids, such as: ? Water. ? Fruit juice. ? Black coffee. ? Plain tea.  Eating and drinking restrictions Follow instructions from your doctor about eating and drinking. These may include:  8 hours before the procedure - stop eating heavy meals or foods, such as: ? Meat. ? Fried foods. ? Fatty foods.  6 hours before the procedure - stop eating light meals or foods, such as: ? Toast. ? Cereal.  6 hours before the procedure - stop drinking: ? Milk ? Drinks that have milk in them.  2 hours before the procedure - stop drinking clear liquids.  Medicines  Ask your doctor about: ? Changing or stopping your normal medicines. This is important if you take diabetes medicines or blood thinners. ? Taking medicines such as aspirin and ibuprofen. These medicines can thin your  blood. Do not take these medicines before your procedure if your doctor tells you not to.  You may be given antibiotic medicine. This can help prevent infection.  You may be asked to take medicines that help you poop (laxatives). General instructions  Ask your doctor how your surgical site will be marked or identified.  You may be asked to shower with a germ-killing soap.  Plan to have someone take you home from the hospital.  Do not use any products that contain nicotine or tobacco, such as cigarettes and e-cigarettes. If you need help quitting, ask your doctor.  You may have an exam or tests done.  You may have a blood or urine sample taken.  You may need to have an enema to clean out your rectum and lower colon.  Talk to your doctor about the changes this procedure may cause. These can be physical or emotional. What happens during  the procedure?  To lower your risk of infection: ? Your health care team will wash or clean their hands. ? Your skin will be washed with soap. ? Hair may be removed from the surgical area.  An IV tube will be put into one of your veins.  You will be given one or more of the following: ? A medicine to help you relax (sedative). ? A medicine to make you fall asleep (general anesthetic).  Tight-fitting (compression) stockings will be placed on your legs to help with circulation.  A thin, flexible tube (catheter) will be inserted to help drain your urine.  The doctor will make a cut (incision) through the skin in your lower belly. It may go side-to-side or up-and-down.  The doctor will move the body tissues that cover your womb.  The doctor will remove your womb.  The doctor may take out any other parts that need to be removed.  The doctor will control the bleeding.  The doctor will close your cut with stitches (sutures), skin glue, or adhesive strips.  A bandage (dressing) will be placed over the cut. The procedure may vary among doctors and hospitals. What happens after the procedure?  You will be given pain medicine if you need it.  Your blood pressure, heart rate, breathing rate, and blood oxygen level will be watched until the medicines you were given have worn off.  You will need to stay in the hospital to recover. Ask your doctor how long you will need to stay in the hospital after your procedure.  You may have a liquid diet at first. You will most likely return to your usual diet the day after surgery.  You will still have the urinary catheter in place. It will likely be removed the day after surgery.  You may have to wear compression stockings. These stockings help to prevent blood clots and reduce swelling in your legs.  You will be encouraged to walk as soon as possible. You will also use a device or do breathing exercises to keep your lungs clear.  You may need  to use a sanitary pad for vaginal discharge. Summary  Abdominal hysterectomy is a surgery to remove your womb (uterus). The womb is the part of your body that holds a growing baby.  Talk to your doctor about the changes this procedure may cause. These can be physical or emotional.  You will be given pain medicine if you need it.  You will need to stay in the hospital to recover for one to two days. Ask your doctor  how long you will need to stay in the hospital after your procedure. This information is not intended to replace advice given to you by your health care provider. Make sure you discuss any questions you have with your health care provider. Document Released: 10/12/2013 Document Revised: 09/25/2016 Document Reviewed: 09/25/2016 Elsevier Interactive Patient Education  2017 [employer].

## 2017-06-11 NOTE — Progress Notes (Signed)
HPI:  Follow up Hypertension: -started hctc 05/29/17 -reports is doing great -Swelling resolved in her legs and blood pressure doing great -No chest pain, shortness of breath, headache  She wants me to check her left ear, feels full, can't hear well out of this ear. Did over-the-counter ear wax drops,but this has not helped.no pain, redness, drainage or swelling.  Once to quit smoking. She has tried to do this on her own but has not been able. Smokes about 4-8 cigarettes per day. She has used patches in the past which really helped. Once to consider this again.  -due for labs, CPE - agrees to schedule after her hysterectomy, hep c with labsat physical, tetanus - once to get today, mammo - plans to do with her gynecologist, colonosocpy - once the check on [place], flu shot  ROS: See pertinent positives and negatives per HPI.  Past Medical History:  Diagnosis Date  . Chronic bronchitis ([place])   . Goiter   . History of hypertension   . Hyperlipidemia   . Thyroid disease     Past Surgical History:  Procedure Laterality Date  . CARDIAC ELECTROPHYSIOLOGY STUDY AND ABLATION    . TONSILLECTOMY  1967  . TUBAL LIGATION     X2, REVERSAL     Family History  Problem Relation Age of Onset  . Thyroid disease Maternal Aunt   . Hypertension Mother   . Renal Disease Mother   . Diabetes Father   . Hypertension Father   . Renal Disease Father   . Stroke Maternal Grandmother   . Cancer Paternal Grandmother        ?    Social History   Social History  . Marital status: Single    Spouse name: N/A  . Number of children: N/A  . Years of education: N/A   Social History Main Topics  . Smoking status: Current Every Day Smoker  . Smokeless tobacco: Never Used     Comment: 4 CIGS A DAY  . Alcohol use Yes     Comment: OCC  . Drug use: Unknown  . Sexual activity: Yes    Partners: Male     Comment: 1ST INTERCOURSE- 44, PARTNERS- 61, CURRENT PARTNER- 3.5 YRS    Other Topics  Concern  . 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 Christian, no church currenty      Lifestyle: "poor diet"; no exercise        Current Outpatient Prescriptions:  .  hydrochlorothiazide (HYDRODIURIL) 25 MG tablet, Take 1 tablet (25 mg total) by mouth daily., Disp: 90 tablet, Rfl: 3 .  nicotine ([employer] NICOTINE TRANSDERMAL SYS) 14 mg/24hr patch, Place 1 patch (14 mg total) onto the skin daily., Disp: 56 patch, Rfl: 0 .  nicotine ([employer] NICOTINE) 7 mg/24hr patch, Place 1 patch (7 mg total) onto the skin daily., Disp: 14 patch, Rfl: 0  EXAM:  Vitals:   06/12/17 0904  BP: 124/88  Pulse: 75  Temp: (!) 97.5 F (36.4 C)    Body mass index is 25.06 kg/m.  GENERAL: vitals reviewed and listed above, alert, oriented, appears well hydrated and in no acute distress  HEENT: atraumatic, conjunttiva clear, no obvious abnormalities on inspection of external nose and ears, CV has cerumen impaction of the left ear canal - after removal of the wax she has normal left ear  canal and TM  NECK: supple  LUNGS: clear to auscultation bilaterally, no wheezes, rales or rhonchi, good air movement  CV: HRRR, no peripheral edema  MS: moves all extremities without noticeable abnormality  PSYCH: pleasant and cooperative, no obvious depression or anxiety  ASSESSMENT AND PLAN:  Discussed the following assessment and plan:  Essential hypertension - Plan: Basic metabolic panel  Tobacco use  Hyperlipidemia, unspecified hyperlipidemia type  Hyperglycemia - Plan: Hemoglobin A1c  Aortic atherosclerosis (HCC)  Impacted cerumen of left ear  Need for diphtheria-tetanus-pertussis (Tdap) vaccine - Plan: Tdap vaccine greater than or equal to 7yo IM  -blood pressures improved and she decided to continue the current dose of the hydrochlorothiazide and quit smoking -for the ear issues she opted for removal of  the wax from the left ear canalwith a curet, she tolerated this well and felt much better after the wax was removed, reported she could hear again -discussed her tobacco use and smoking cessation at length for about 5 minutes, she opted to try nicotine patches to assist her in quitting as these worked in the past, prescriptions were sent, the also advised that she have a plan in place for physical cravings -Advised of preventive measures that are needed and she agrees to set up a mammogram with her gynecologist and check on the cost of Cologuard with her company -We will get her labs today -She wants to do a tetanus booster today -Physical exam in 3 months to follow up on preventative measures -Advised a healthy lifestyle smoking cessation and cardiovascular risk reduction, also want to consider a statin -Patient advised to return or notify a doctor immediately if symptoms worsen or persist or new concerns arise.  Patient Instructions  BEFORE YOU LEAVE: -tetanus vaccine -Flu shot if she wishes -follow up: 3 months for physical  Quit smoking. I sent the patches to your pharmacy.Good luck! You can do this! -use the 14 mg patches for 6 weeks, then use the 7 mg patches for 2 weeks -Quit smoking as soon as she apply the patch and have a plan for the physical cravings (carrots sticks, etc) -Discussed the use/discontinuation of your patches with your surgeon prior to your surgery  Call your insurance today to check on the cost of Cologuard for colon cancer screening. Please let my assistant know what form of colon cancer screening you would like to do.  Please get your mammogram.  Please put a few drops of the ear wax cleaning drops in your left ear daily for 1 week.      Colin Benton R., DO

## 2017-06-12 ENCOUNTER — Ambulatory Visit (INDEPENDENT_AMBULATORY_CARE_PROVIDER_SITE_OTHER): Payer: BLUE CROSS/BLUE SHIELD | Admitting: Family Medicine

## 2017-06-12 ENCOUNTER — Encounter: Payer: Self-pay | Admitting: Family Medicine

## 2017-06-12 VITALS — BP 124/88 | HR 75 | Temp 97.5°F | Ht 63.5 in | Wt 143.7 lb

## 2017-06-12 DIAGNOSIS — F17210 Nicotine dependence, cigarettes, uncomplicated: Secondary | ICD-10-CM

## 2017-06-12 DIAGNOSIS — E785 Hyperlipidemia, unspecified: Secondary | ICD-10-CM | POA: Diagnosis not present

## 2017-06-12 DIAGNOSIS — R739 Hyperglycemia, unspecified: Secondary | ICD-10-CM

## 2017-06-12 DIAGNOSIS — I10 Essential (primary) hypertension: Secondary | ICD-10-CM

## 2017-06-12 DIAGNOSIS — Z23 Encounter for immunization: Secondary | ICD-10-CM

## 2017-06-12 DIAGNOSIS — Z720 Tobacco use: Secondary | ICD-10-CM | POA: Diagnosis not present

## 2017-06-12 DIAGNOSIS — I700 Atherosclerosis of aorta: Secondary | ICD-10-CM

## 2017-06-12 DIAGNOSIS — H6122 Impacted cerumen, left ear: Secondary | ICD-10-CM | POA: Diagnosis not present

## 2017-06-12 LAB — HEMOGLOBIN A1C: Hgb A1c MFr Bld: 5.6 % (ref 4.6–6.5)

## 2017-06-12 LAB — BASIC METABOLIC PANEL
BUN: 19 mg/dL (ref 6–23)
CHLORIDE: 100 meq/L (ref 96–112)
CO2: 33 meq/L — AB (ref 19–32)
Calcium: 10 mg/dL (ref 8.4–10.5)
Creatinine, Ser: 0.81 mg/dL (ref 0.40–1.20)
GFR: 94.62 mL/min (ref >60.00)
Glucose, Bld: 90 mg/dL (ref 70–99)
Potassium: 3.8 mEq/L (ref 3.5–5.1)
SODIUM: 139 meq/L (ref 135–145)

## 2017-06-12 MED ORDER — NICOTINE 14 MG/24HR TD PT24: 14.0000 mg | MEDICATED_PATCH | Freq: Every day | TRANSDERMAL | 0 refills | Status: DC

## 2017-06-12 MED ORDER — NICOTINE 7 MG/24HR TD PT24: 7.0000 mg | MEDICATED_PATCH | Freq: Every day | TRANSDERMAL | 0 refills | Status: DC

## 2017-06-12 NOTE — Patient Instructions (Addendum)
BEFORE YOU LEAVE: -tetanus vaccine -Flu shot if she wishes -follow up: 3 months for physical  Quit smoking. I sent the patches to your pharmacy.Good luck! You can do this! -use the 14 mg patches for 6 weeks, then use the 7 mg patches for 2 weeks -Quit smoking as soon as she apply the patch and have a plan for the physical cravings (carrots sticks, etc) -Discussed the use/discontinuation of your patches with your surgeon prior to your surgery  Call your insurance today to check on the cost of Cologuard for colon cancer screening. Please let my assistant know what form of colon cancer screening you would like to do.  Please get your mammogram.  Please put a few drops of the ear wax cleaning drops in your left ear daily for 1 week.

## 2017-06-17 NOTE — Patient Instructions (Addendum)
Ariel [name]  06/17/2017   Your procedure is scheduled on: 06-26-17  Report to [facility] Main  Entrance Take [place]  elevators to 3rd floor to  [place] at 530AM.   Call this number if you have problems the morning of surgery [phone]    Remember: ONLY 1 PERSON MAY GO WITH YOU TO SHORT STAY TO GET  READY MORNING OF [place].    Eat a light diet the day before surgery on Wednesday 06-25-17.  Examples including soups, broths, toast, yogurt, mashed potatoes.  Things to avoid include  carbonated beverages (fizzy  beverages), raw fruits and raw vegetables, or beans.    If your bowels are filled with gas, your surgeon will have difficulty visualizing your pelvic organs which increases your surgical risks.   Do not eat food or drink liquids :After Midnight.     Take these medicines the morning of surgery with A SIP OF WATER: NONE                                You may not have any metal on your body including hair pins and              piercings  Do not wear jewelry, make-up, lotions, powders or perfumes, deodorant             Do not wear nail polish.  Do not shave  48 hours prior to surgery.     Do not bring valuables to the hospital. [place].  Contacts, dentures or bridgework may not be worn into surgery.  Leave suitcase in the car. After surgery it may be brought to your room.               Please read over the following fact sheets you were given: _____________________________________________________________________             [facility] - Preparing for Surgery Before surgery, you can play an important role.  Because skin is not sterile, your skin needs to be as free of germs as possible.  You can reduce the number of germs on your skin by washing with CHG (chlorahexidine gluconate) soap before surgery.  CHG is an antiseptic cleaner which kills germs and bonds with the skin to continue  killing germs even after washing. Please DO NOT use if you have an allergy to CHG or antibacterial soaps.  If your skin becomes reddened/irritated stop using the CHG and inform your nurse when you arrive at Short Stay. Do not shave (including legs and underarms) for at least 48 hours prior to the first CHG shower.  You may shave your face/neck. Please follow these instructions carefully:  1.  Shower with CHG Soap the night before surgery and the  morning of Surgery.  2.  If you choose to wash your hair, wash your hair first as usual with your  normal  shampoo.  3.  After you shampoo, rinse your hair and body thoroughly to remove the  shampoo.                           4.  Use CHG as you would any other liquid soap.  You  can apply chg directly  to the skin and wash                       Gently with a scrungie or clean washcloth.  5.  Apply the CHG Soap to your body ONLY FROM THE NECK DOWN.   Do not use on face/ open                           Wound or open sores. Avoid contact with eyes, ears mouth and genitals (private parts).                       Wash face,  Genitals (private parts) with your normal soap.             6.  Wash thoroughly, paying special attention to the area where your surgery  will be performed.  7.  Thoroughly rinse your body with warm water from the neck down.  8.  DO NOT shower/wash with your normal soap after using and rinsing off  the CHG Soap.                9.  Pat yourself dry with a clean towel.            10.  Wear clean pajamas.            11.  Place clean sheets on your bed the night of your first shower and do not  sleep with pets. Day of Surgery : Do not apply any lotions/deodorants the morning of surgery.  Please wear clean clothes to the hospital/surgery center.  FAILURE TO FOLLOW THESE INSTRUCTIONS MAY RESULT IN THE CANCELLATION OF YOUR SURGERY PATIENT SIGNATURE_________________________________  NURSE  SIGNATURE__________________________________  ________________________________________________________________________   Adam Phenix  An incentive spirometer is a tool that can help keep your lungs clear and active. This tool measures how well you are filling your lungs with each breath. Taking long deep breaths may help reverse or decrease the chance of developing breathing (pulmonary) problems (especially infection) following:  A long period of time when you are unable to move or be active. BEFORE THE PROCEDURE   If the spirometer includes an indicator to show your best effort, your nurse or respiratory therapist will set it to a desired goal.  If possible, sit up straight or lean slightly forward. Try not to slouch.  Hold the incentive spirometer in an upright position. INSTRUCTIONS FOR USE  1. Sit on the edge of your bed if possible, or sit up as far as you can in bed or on a chair. 2. Hold the incentive spirometer in an upright position. 3. Breathe out normally. 4. Place the mouthpiece in your mouth and seal your lips tightly around it. 5. Breathe in slowly and as deeply as possible, raising the piston or the ball toward the top of the column. 6. Hold your breath for 3-5 seconds or for as long as possible. Allow the piston or ball to fall to the bottom of the column. 7. Remove the mouthpiece from your mouth and breathe out normally. 8. Rest for a few seconds and repeat Steps 1 through 7 at least 10 times every 1-2 hours when you are awake. Take your time and take a few normal breaths between deep breaths. 9. The spirometer may include an indicator to show your best effort. Use the indicator as a goal to work toward during  each repetition. 10. After each set of 10 deep breaths, practice coughing to be sure your lungs are clear. If you have an incision (the cut made at the time of surgery), support your incision when coughing by placing a pillow or rolled up towels firmly  against it. Once you are able to get out of bed, walk around indoors and cough well. You may stop using the incentive spirometer when instructed by your caregiver.  RISKS AND COMPLICATIONS  Take your time so you do not get dizzy or light-headed.  If you are in pain, you may need to take or ask for pain medication before doing incentive spirometry. It is harder to take a deep breath if you are having pain. AFTER USE  Rest and breathe slowly and easily.  It can be helpful to keep track of a log of your progress. Your caregiver can provide you with a simple table to help with this. If you are using the spirometer at home, follow these instructions: [place] IF:   You are having difficultly using the spirometer.  You have trouble using the spirometer as often as instructed.  Your pain medication is not giving enough relief while using the spirometer.  You develop fever of 100.5 F (38.1 C) or higher. SEEK IMMEDIATE MEDICAL CARE IF:   You cough up bloody sputum that had not been present before.  You develop fever of 102 F (38.9 C) or greater.  You develop worsening pain at or near the incision site. MAKE SURE YOU:   Understand these instructions.  Will watch your condition.  Will get help right away if you are not doing well or get worse. Document Released: 02/17/2007 Document Revised: 12/30/2011 Document Reviewed: 04/20/2007 ExitCare Patient Information 2014 ExitCare, [place].   ________________________________________________________________________  WHAT IS A BLOOD TRANSFUSION? Blood Transfusion Information  A transfusion is the replacement of blood or some of its parts. Blood is made up of multiple cells which provide different functions.  Red blood cells carry oxygen and are used for blood loss replacement.  White blood cells fight against infection.  Platelets control bleeding.  Plasma helps clot blood.  Other blood products are available for  specialized needs, such as hemophilia or other clotting disorders. BEFORE THE TRANSFUSION  Who gives blood for transfusions?   Healthy volunteers who are fully evaluated to make sure their blood is safe. This is blood bank blood. Transfusion therapy is the safest it has ever been in the practice of medicine. Before blood is taken from a donor, a complete history is taken to make sure that person has no history of diseases nor engages in risky social behavior (examples are intravenous drug use or sexual activity with multiple partners). The donor's travel history is screened to minimize risk of transmitting infections, such as malaria. The donated blood is tested for signs of infectious diseases, such as HIV and hepatitis. The blood is then tested to be sure it is compatible with you in order to minimize the chance of a transfusion reaction. If you or a relative donates blood, this is often done in anticipation of surgery and is not appropriate for emergency situations. It takes many days to process the donated blood. RISKS AND COMPLICATIONS Although transfusion therapy is very safe and saves many lives, the main dangers of transfusion include:   Getting an infectious disease.  Developing a transfusion reaction. This is an allergic reaction to something in the blood you were given. Every precaution is taken to prevent  this. The decision to have a blood transfusion has been considered carefully by your caregiver before blood is given. Blood is not given unless the benefits outweigh the risks. AFTER THE TRANSFUSION  Right after receiving a blood transfusion, you will usually feel much better and more energetic. This is especially true if your red blood cells have gotten low (anemic). The transfusion raises the level of the red blood cells which carry oxygen, and this usually causes an energy increase.  The nurse administering the transfusion will monitor you carefully for complications. HOME CARE  INSTRUCTIONS  No special instructions are needed after a transfusion. You may find your energy is better. Speak with your caregiver about any limitations on activity for underlying diseases you may have. SEEK MEDICAL CARE IF:   Your condition is not improving after your transfusion.  You develop redness or irritation at the intravenous (IV) site. SEEK IMMEDIATE MEDICAL CARE IF:  Any of the following symptoms occur over the next 12 hours:  Shaking chills.  You have a temperature by mouth above 102 F (38.9 C), not controlled by medicine.  Chest, back, or muscle pain.  People around you feel you are not acting correctly or are confused.  Shortness of breath or difficulty breathing.  Dizziness and fainting.  You get a rash or develop hives.  You have a decrease in urine output.  Your urine turns a dark color or changes to pink, red, or brown. Any of the following symptoms occur over the next 10 days:  You have a temperature by mouth above 102 F (38.9 C), not controlled by medicine.  Shortness of breath.  Weakness after normal activity.  The white part of the eye turns yellow (jaundice).  You have a decrease in the amount of urine or are urinating less often.  Your urine turns a dark color or changes to pink, red, or brown. Document Released: 10/04/2000 Document Revised: 12/30/2011 Document Reviewed: 05/23/2008 [facility] Patient Information 2014 [place], [place].  _______________________________________________________________________

## 2017-06-17 NOTE — Progress Notes (Signed)
HgA1C 06-12-17 epic BMP 06-12-17 epic

## 2017-06-18 ENCOUNTER — Encounter (HOSPITAL_COMMUNITY): Payer: Self-pay

## 2017-06-18 ENCOUNTER — Encounter (HOSPITAL_COMMUNITY)
Admission: RE | Admit: 2017-06-18 | Discharge: 2017-06-18 | Disposition: A | Discharge status: Home / Self Care | Payer: BLUE CROSS/BLUE SHIELD | Source: Ambulatory Visit | Attending: Gynecologic Oncology | Admitting: Gynecologic Oncology

## 2017-06-18 DIAGNOSIS — Z01812 Encounter for preprocedural laboratory examination: Secondary | ICD-10-CM | POA: Diagnosis present

## 2017-06-18 DIAGNOSIS — Z01810 Encounter for preprocedural cardiovascular examination: Secondary | ICD-10-CM | POA: Insufficient documentation

## 2017-06-18 DIAGNOSIS — I10 Essential (primary) hypertension: Secondary | ICD-10-CM | POA: Insufficient documentation

## 2017-06-18 LAB — COMPREHENSIVE METABOLIC PANEL
ALBUMIN: 3.7 g/dL (ref 3.5–5.0)
ALK PHOS: 91 U/L (ref 38–126)
ALT: 13 U/L — AB (ref 14–54)
ANION GAP: 6 (ref 5–15)
AST: 22 U/L (ref 15–41)
BILIRUBIN TOTAL: 0.5 mg/dL (ref 0.3–1.2)
BUN: 19 mg/dL (ref 6–20)
CALCIUM: 9.5 mg/dL (ref 8.9–10.3)
CO2: 29 mmol/L (ref 22–32)
CREATININE: 0.82 mg/dL (ref 0.44–1.00)
Chloride: 104 mmol/L (ref 101–111)
GFR calc Af Amer: >60 mL/min (ref >60)
GFR calc non Af Amer: >60 mL/min (ref >60)
GLUCOSE: 79 mg/dL (ref 65–99)
Potassium: 3.8 mmol/L (ref 3.5–5.1)
Sodium: 139 mmol/L (ref 135–145)
TOTAL PROTEIN: 7.2 g/dL (ref 6.5–8.1)

## 2017-06-18 LAB — URINALYSIS, ROUTINE W REFLEX MICROSCOPIC
BACTERIA UA: NONE SEEN
BILIRUBIN URINE: NEGATIVE
Glucose, UA: NEGATIVE mg/dL
KETONES UR: NEGATIVE mg/dL
NITRITE: NEGATIVE
Protein, ur: NEGATIVE mg/dL
Specific Gravity, Urine: 1.019 (ref 1.005–1.030)
pH: 5 (ref 5.0–8.0)

## 2017-06-18 LAB — CBC
HEMATOCRIT: 41.7 % (ref 36.0–46.0)
HEMOGLOBIN: 14.2 g/dL (ref 12.0–15.0)
MCH: 32.6 pg (ref 26.0–34.0)
MCHC: 34.1 g/dL (ref 30.0–36.0)
MCV: 95.6 fL (ref 78.0–100.0)
Platelets: 195 10*3/uL (ref 150–400)
RBC: 4.36 MIL/uL (ref 3.87–5.11)
RDW: 12.1 % (ref 11.5–15.5)
WBC: 4.6 10*3/uL (ref 4.0–10.5)

## 2017-06-18 LAB — PREGNANCY, URINE: PREG TEST UR: NEGATIVE

## 2017-06-18 LAB — ABO/RH: ABO/RH(D): A POS

## 2017-06-18 NOTE — Progress Notes (Signed)
UA result routed via epic to Dr Everitt Amber

## 2017-06-19 ENCOUNTER — Other Ambulatory Visit (HOSPITAL_COMMUNITY)
Admission: RE | Admit: 2017-06-19 | Discharge: 2017-06-19 | Disposition: A | Discharge status: Home / Self Care | Payer: BLUE CROSS/BLUE SHIELD | Source: Ambulatory Visit | Attending: Endocrinology | Admitting: Endocrinology

## 2017-06-19 ENCOUNTER — Ambulatory Visit (INDEPENDENT_AMBULATORY_CARE_PROVIDER_SITE_OTHER): Payer: BLUE CROSS/BLUE SHIELD | Admitting: Endocrinology

## 2017-06-19 ENCOUNTER — Encounter: Payer: Self-pay | Admitting: Endocrinology

## 2017-06-19 VITALS — BP 122/84 | HR 64 | Wt 147.0 lb

## 2017-06-19 DIAGNOSIS — E049 Nontoxic goiter, unspecified: Secondary | ICD-10-CM | POA: Diagnosis not present

## 2017-06-19 NOTE — Progress Notes (Signed)
   Subjective:    Patient ID: Ariel [name], female    DOB: 21-Aug-1961, 56 y.o.   MRN: [id number]  HPI  bx only   Review of Systems     Objective:   Physical Exam   thyroid needle bx: consent obtained, signed form on chart The area is first sprayed with cooling agent local: xylocaine 2%, with epinephrine prep: alcohol pad 4 bxs are done with 25 and 84K needles no complications        Assessment & Plan:

## 2017-06-19 NOTE — Patient Instructions (Signed)
blood tests are requested for you today.  We'll let you know about the results. If no cancer is found, Please come back for a follow-up appointment in 6-12 months.

## 2017-06-26 ENCOUNTER — Inpatient Hospital Stay (HOSPITAL_COMMUNITY): Payer: BLUE CROSS/BLUE SHIELD | Admitting: Anesthesiology

## 2017-06-26 ENCOUNTER — Encounter (HOSPITAL_COMMUNITY): Payer: Self-pay | Admitting: *Deleted

## 2017-06-26 ENCOUNTER — Inpatient Hospital Stay (HOSPITAL_COMMUNITY)
Admission: RE | Admit: 2017-06-26 | Discharge: 2017-06-29 | DRG: 743 | Disposition: A | Discharge status: Home / Self Care | Payer: BLUE CROSS/BLUE SHIELD | Source: Ambulatory Visit | Attending: Gynecologic Oncology | Admitting: Gynecologic Oncology

## 2017-06-26 ENCOUNTER — Encounter (HOSPITAL_COMMUNITY)
Admission: RE | Disposition: A | Discharge status: Home / Self Care | Payer: Self-pay | Source: Ambulatory Visit | Attending: Gynecologic Oncology

## 2017-06-26 DIAGNOSIS — Z803 Family history of malignant neoplasm of breast: Secondary | ICD-10-CM

## 2017-06-26 DIAGNOSIS — F17210 Nicotine dependence, cigarettes, uncomplicated: Secondary | ICD-10-CM | POA: Diagnosis present

## 2017-06-26 DIAGNOSIS — I10 Essential (primary) hypertension: Secondary | ICD-10-CM | POA: Diagnosis present

## 2017-06-26 DIAGNOSIS — D251 Intramural leiomyoma of uterus: Secondary | ICD-10-CM | POA: Diagnosis present

## 2017-06-26 DIAGNOSIS — E039 Hypothyroidism, unspecified: Secondary | ICD-10-CM | POA: Diagnosis present

## 2017-06-26 DIAGNOSIS — E785 Hyperlipidemia, unspecified: Secondary | ICD-10-CM | POA: Diagnosis present

## 2017-06-26 DIAGNOSIS — E049 Nontoxic goiter, unspecified: Secondary | ICD-10-CM | POA: Diagnosis present

## 2017-06-26 DIAGNOSIS — Z8249 Family history of ischemic heart disease and other diseases of the circulatory system: Secondary | ICD-10-CM | POA: Diagnosis not present

## 2017-06-26 DIAGNOSIS — D259 Leiomyoma of uterus, unspecified: Secondary | ICD-10-CM

## 2017-06-26 DIAGNOSIS — R1900 Intra-abdominal and pelvic swelling, mass and lump, unspecified site: Secondary | ICD-10-CM

## 2017-06-26 DIAGNOSIS — I739 Peripheral vascular disease, unspecified: Secondary | ICD-10-CM | POA: Diagnosis present

## 2017-06-26 HISTORY — PX: LAPAROTOMY: SHX154

## 2017-06-26 LAB — TYPE AND SCREEN
ABO/RH(D): A POS
Antibody Screen: NEGATIVE

## 2017-06-26 SURGERY — LAPAROTOMY, EXPLORATORY
Anesthesia: General

## 2017-06-26 MED ORDER — DEXAMETHASONE SODIUM PHOSPHATE 10 MG/ML IJ SOLN
INTRAMUSCULAR | Status: AC
  Filled 2017-06-26: qty 1

## 2017-06-26 MED ORDER — HYDROMORPHONE HCL-NACL 0.5-0.9 MG/ML-% IV SOSY
0.5000 mg | PREFILLED_SYRINGE | INTRAVENOUS | Status: DC | PRN
  Administered 2017-06-26 – 2017-06-28 (×5): 0.5 mg via INTRAVENOUS
  Filled 2017-06-26 (×5): qty 1

## 2017-06-26 MED ORDER — OXYCODONE HCL 5 MG PO TABS
5.0000 mg | ORAL_TABLET | ORAL | Status: DC | PRN
  Administered 2017-06-27 – 2017-06-29 (×3): 5 mg via ORAL
  Filled 2017-06-26 (×3): qty 1

## 2017-06-26 MED ORDER — PREGABALIN 75 MG PO CAPS
75.0000 mg | ORAL_CAPSULE | Freq: Two times a day (BID) | ORAL | Status: DC
  Administered 2017-06-27 – 2017-06-29 (×5): 75 mg via ORAL
  Filled 2017-06-26 (×5): qty 1

## 2017-06-26 MED ORDER — EPHEDRINE SULFATE-NACL 50-0.9 MG/10ML-% IV SOSY
PREFILLED_SYRINGE | INTRAVENOUS | Status: DC | PRN
  Administered 2017-06-26: 5 mg via INTRAVENOUS

## 2017-06-26 MED ORDER — MIDAZOLAM HCL 5 MG/5ML IJ SOLN
INTRAMUSCULAR | Status: DC | PRN
  Administered 2017-06-26: 2 mg via INTRAVENOUS

## 2017-06-26 MED ORDER — ENSURE ENLIVE PO LIQD
237.0000 mL | Freq: Two times a day (BID) | ORAL | Status: DC
  Administered 2017-06-27: 237 mL via ORAL

## 2017-06-26 MED ORDER — LABETALOL HCL 5 MG/ML IV SOLN
20.0000 mg | Freq: Once | INTRAVENOUS | Status: AC
  Administered 2017-06-26: 20 mg via INTRAVENOUS
  Filled 2017-06-26: qty 4

## 2017-06-26 MED ORDER — SUGAMMADEX SODIUM 200 MG/2ML IV SOLN
INTRAVENOUS | Status: DC | PRN
  Administered 2017-06-26: 140 mg via INTRAVENOUS

## 2017-06-26 MED ORDER — ONDANSETRON HCL 4 MG/2ML IJ SOLN
4.0000 mg | Freq: Four times a day (QID) | INTRAMUSCULAR | Status: DC | PRN
  Administered 2017-06-26 – 2017-06-27 (×4): 4 mg via INTRAVENOUS
  Filled 2017-06-26 (×4): qty 2

## 2017-06-26 MED ORDER — HYDROMORPHONE HCL-NACL 0.5-0.9 MG/ML-% IV SOSY
PREFILLED_SYRINGE | INTRAVENOUS | Status: AC
  Administered 2017-06-26: 10:00:00
  Filled 2017-06-26: qty 3

## 2017-06-26 MED ORDER — SUGAMMADEX SODIUM 200 MG/2ML IV SOLN
INTRAVENOUS | Status: AC
  Filled 2017-06-26: qty 2

## 2017-06-26 MED ORDER — PROPOFOL 10 MG/ML IV BOLUS
INTRAVENOUS | Status: DC | PRN
  Administered 2017-06-26: 120 mg via INTRAVENOUS

## 2017-06-26 MED ORDER — NICOTINE 7 MG/24HR TD PT24: 7.0000 mg | MEDICATED_PATCH | Freq: Every day | TRANSDERMAL | Status: DC

## 2017-06-26 MED ORDER — KCL IN DEXTROSE-NACL 20-5-0.45 MEQ/L-%-% IV SOLN
INTRAVENOUS | Status: DC
  Administered 2017-06-26 – 2017-06-27 (×2): via INTRAVENOUS
  Filled 2017-06-26 (×3): qty 1000

## 2017-06-26 MED ORDER — ONDANSETRON HCL 4 MG/2ML IJ SOLN
INTRAMUSCULAR | Status: AC
  Filled 2017-06-26: qty 2

## 2017-06-26 MED ORDER — SODIUM CHLORIDE 0.9 % IJ SOLN
INTRAMUSCULAR | Status: DC | PRN
  Administered 2017-06-26: 20 mL

## 2017-06-26 MED ORDER — PROPOFOL 10 MG/ML IV BOLUS
INTRAVENOUS | Status: AC
  Filled 2017-06-26: qty 20

## 2017-06-26 MED ORDER — CEFAZOLIN SODIUM-DEXTROSE 2-4 GM/100ML-% IV SOLN
2.0000 g | INTRAVENOUS | Status: AC
  Administered 2017-06-26: 2 g via INTRAVENOUS

## 2017-06-26 MED ORDER — BUPIVACAINE HCL (PF) 0.25 % IJ SOLN
INTRAMUSCULAR | Status: AC
  Filled 2017-06-26: qty 30

## 2017-06-26 MED ORDER — FENTANYL CITRATE (PF) 100 MCG/2ML IJ SOLN
INTRAMUSCULAR | Status: DC | PRN
Start: 2017-06-26 — End: 2017-06-26
  Administered 2017-06-26 (×4): 50 ug via INTRAVENOUS

## 2017-06-26 MED ORDER — ONDANSETRON HCL 4 MG/2ML IJ SOLN
INTRAMUSCULAR | Status: DC | PRN
  Administered 2017-06-26: 4 mg via INTRAVENOUS

## 2017-06-26 MED ORDER — ROCURONIUM BROMIDE 100 MG/10ML IV SOLN
INTRAVENOUS | Status: DC | PRN
  Administered 2017-06-26: 50 mg via INTRAVENOUS
  Administered 2017-06-26: 20 mg via INTRAVENOUS

## 2017-06-26 MED ORDER — BUPIVACAINE HCL 0.25 % IJ SOLN
INTRAMUSCULAR | Status: DC | PRN
  Administered 2017-06-26: 20 mL

## 2017-06-26 MED ORDER — ENOXAPARIN SODIUM 40 MG/0.4ML ~~LOC~~ SOLN
40.0000 mg | SUBCUTANEOUS | Status: AC
  Administered 2017-06-26: 40 mg via SUBCUTANEOUS
  Filled 2017-06-26: qty 0.4

## 2017-06-26 MED ORDER — ACETAMINOPHEN 500 MG PO TABS
1000.0000 mg | ORAL_TABLET | Freq: Four times a day (QID) | ORAL | Status: DC
  Administered 2017-06-26 – 2017-06-29 (×10): 1000 mg via ORAL
  Filled 2017-06-26 (×12): qty 2

## 2017-06-26 MED ORDER — LACTATED RINGERS IV SOLN
INTRAVENOUS | Status: DC | PRN
  Administered 2017-06-26: 1000 mL
  Administered 2017-06-26 (×2): via INTRAVENOUS

## 2017-06-26 MED ORDER — ONDANSETRON HCL 4 MG PO TABS: 4.0000 mg | ORAL_TABLET | Freq: Four times a day (QID) | ORAL | Status: DC | PRN

## 2017-06-26 MED ORDER — 0.9 % SODIUM CHLORIDE (POUR BTL) OPTIME
TOPICAL | Status: DC | PRN
  Administered 2017-06-26: 4000 mL

## 2017-06-26 MED ORDER — PROMETHAZINE HCL 25 MG/ML IJ SOLN
INTRAMUSCULAR | Status: AC
  Administered 2017-06-26: 10:00:00
  Filled 2017-06-26: qty 1

## 2017-06-26 MED ORDER — KETOROLAC TROMETHAMINE 30 MG/ML IJ SOLN: 15.0000 mg | Freq: Four times a day (QID) | INTRAMUSCULAR | Status: AC

## 2017-06-26 MED ORDER — LIDOCAINE HCL (CARDIAC) 20 MG/ML IV SOLN
INTRAVENOUS | Status: DC | PRN
  Administered 2017-06-26: 60 mg via INTRAVENOUS

## 2017-06-26 MED ORDER — KETOROLAC TROMETHAMINE 30 MG/ML IJ SOLN
15.0000 mg | Freq: Four times a day (QID) | INTRAMUSCULAR | Status: AC
  Administered 2017-06-26 – 2017-06-27 (×4): 15 mg via INTRAVENOUS
  Filled 2017-06-26 (×5): qty 1

## 2017-06-26 MED ORDER — BUPIVACAINE LIPOSOME 1.3 % IJ SUSP
20.0000 mL | Freq: Once | INTRAMUSCULAR | Status: AC
  Administered 2017-06-26: 20 mL
  Filled 2017-06-26: qty 20

## 2017-06-26 MED ORDER — MEPERIDINE HCL 50 MG/ML IJ SOLN: 6.2500 mg | INTRAMUSCULAR | Status: DC | PRN

## 2017-06-26 MED ORDER — SODIUM CHLORIDE 0.9 % IJ SOLN
INTRAMUSCULAR | Status: AC
  Filled 2017-06-26: qty 50

## 2017-06-26 MED ORDER — ROCURONIUM BROMIDE 50 MG/5ML IV SOSY
PREFILLED_SYRINGE | INTRAVENOUS | Status: AC
  Filled 2017-06-26: qty 5

## 2017-06-26 MED ORDER — SENNOSIDES-DOCUSATE SODIUM 8.6-50 MG PO TABS
2.0000 | ORAL_TABLET | Freq: Every day | ORAL | Status: DC
  Administered 2017-06-26 – 2017-06-28 (×3): 2 via ORAL
  Filled 2017-06-26 (×3): qty 2

## 2017-06-26 MED ORDER — PROMETHAZINE HCL 25 MG/ML IJ SOLN
6.2500 mg | INTRAMUSCULAR | Status: AC | PRN
  Administered 2017-06-26 (×2): 6.25 mg via INTRAVENOUS

## 2017-06-26 MED ORDER — MIDAZOLAM HCL 2 MG/2ML IJ SOLN
INTRAMUSCULAR | Status: AC
  Filled 2017-06-26: qty 2

## 2017-06-26 MED ORDER — FENTANYL CITRATE (PF) 250 MCG/5ML IJ SOLN
INTRAMUSCULAR | Status: AC
  Filled 2017-06-26: qty 5

## 2017-06-26 MED ORDER — LIP MEDEX EX OINT
TOPICAL_OINTMENT | CUTANEOUS | Status: AC
  Administered 2017-06-26: 16:00:00
  Filled 2017-06-26: qty 7

## 2017-06-26 MED ORDER — HYDROMORPHONE HCL-NACL 0.5-0.9 MG/ML-% IV SOSY
0.2500 mg | PREFILLED_SYRINGE | INTRAVENOUS | Status: DC | PRN
  Administered 2017-06-26 (×3): 0.5 mg via INTRAVENOUS

## 2017-06-26 MED ORDER — IBUPROFEN 800 MG PO TABS
800.0000 mg | ORAL_TABLET | Freq: Four times a day (QID) | ORAL | Status: DC
  Administered 2017-06-27 – 2017-06-29 (×9): 800 mg via ORAL
  Filled 2017-06-26 (×9): qty 1

## 2017-06-26 MED ORDER — DEXAMETHASONE SODIUM PHOSPHATE 10 MG/ML IJ SOLN
INTRAMUSCULAR | Status: DC | PRN
  Administered 2017-06-26: 10 mg via INTRAVENOUS

## 2017-06-26 MED ORDER — LACTATED RINGERS IV SOLN: INTRAVENOUS | Status: DC

## 2017-06-26 MED ORDER — NICOTINE 14 MG/24HR TD PT24
14.0000 mg | MEDICATED_PATCH | Freq: Every day | TRANSDERMAL | Status: DC
  Administered 2017-06-26 – 2017-06-29 (×4): 14 mg via TRANSDERMAL
  Filled 2017-06-26 (×4): qty 1

## 2017-06-26 SURGICAL SUPPLY — 53 items
ATTRACTOMAT 16X20 MAGNETIC DRP (DRAPES) ×3 IMPLANT
BLADE EXTENDED COATED 6.5IN (ELECTRODE) ×3 IMPLANT
CELLS DAT CNTRL 66122 CELL SVR (MISCELLANEOUS) IMPLANT
CHLORAPREP W/TINT 26ML (MISCELLANEOUS) ×3 IMPLANT
CLIP VESOCCLUDE LG 6/CT (CLIP) ×3 IMPLANT
CLIP VESOCCLUDE MED 6/CT (CLIP) ×3 IMPLANT
CLIP VESOCCLUDE MED LG 6/CT (CLIP) ×3 IMPLANT
CONT SPEC 4OZ CLIKSEAL STRL BL (MISCELLANEOUS) ×3 IMPLANT
DERMABOND ADVANCED (GAUZE/BANDAGES/DRESSINGS)
DERMABOND ADVANCED .7 DNX12 (GAUZE/BANDAGES/DRESSINGS) IMPLANT
DRAPE INCISE IOBAN 66X45 STRL (DRAPES) IMPLANT
DRAPE WARM FLUID 44X44 (DRAPE) ×3 IMPLANT
DRSG OPSITE POSTOP 4X8 (GAUZE/BANDAGES/DRESSINGS) ×3 IMPLANT
ELECT REM PT RETURN 15FT ADLT (MISCELLANEOUS) ×3 IMPLANT
GAUZE SPONGE 4X4 16PLY XRAY LF (GAUZE/BANDAGES/DRESSINGS) IMPLANT
GLOVE BIO SURGEON STRL SZ 6 (GLOVE) ×6 IMPLANT
GLOVE BIO SURGEON STRL SZ 6.5 (GLOVE) ×6 IMPLANT
GOWN STRL REUS W/ TWL LRG LVL3 (GOWN DISPOSABLE) ×4 IMPLANT
GOWN STRL REUS W/TWL LRG LVL3 (GOWN DISPOSABLE) ×2
HEMOSTAT ARISTA ABSORB 3G PWDR (MISCELLANEOUS) IMPLANT
KIT BASIN OR (CUSTOM PROCEDURE TRAY) ×3 IMPLANT
LIGASURE IMPACT 36 18CM CVD LR (INSTRUMENTS) IMPLANT
LOOP VESSEL MAXI BLUE (MISCELLANEOUS) IMPLANT
NEEDLE HYPO 22GX1.5 SAFETY (NEEDLE) ×6 IMPLANT
NS IRRIG 1000ML POUR BTL (IV SOLUTION) ×6 IMPLANT
PACK GENERAL/GYN (CUSTOM PROCEDURE TRAY) ×3 IMPLANT
RELOAD PROXIMATE 75MM BLUE (ENDOMECHANICALS) IMPLANT
RELOAD PROXIMATE TA60MM BLUE (ENDOMECHANICALS) IMPLANT
RETRACTOR WND ALEXIS 25 LRG (MISCELLANEOUS) IMPLANT
RTRCTR WOUND ALEXIS 18CM MED (MISCELLANEOUS)
RTRCTR WOUND ALEXIS 25CM LRG (MISCELLANEOUS)
SHEET LAVH (DRAPES) ×3 IMPLANT
SPOGE SURGIFLO 8M (HEMOSTASIS)
SPONGE LAP 18X18 X RAY DECT (DISPOSABLE) ×12 IMPLANT
SPONGE SURGIFLO 8M (HEMOSTASIS) IMPLANT
STAPLER GUN LINEAR PROX 60 (STAPLE) IMPLANT
STAPLER PROXIMATE 75MM BLUE (STAPLE) IMPLANT
STAPLER VISISTAT 35W (STAPLE) IMPLANT
SUT MNCRL AB 4-0 PS2 18 (SUTURE) ×9 IMPLANT
SUT PDS AB 1 TP1 96 (SUTURE) ×6 IMPLANT
SUT SILK 3 0 SH CR/8 (SUTURE) ×3 IMPLANT
SUT VIC AB 0 CT1 36 (SUTURE) ×21 IMPLANT
SUT VIC AB 2-0 CT1 36 (SUTURE) ×15 IMPLANT
SUT VIC AB 2-0 CT2 27 (SUTURE) ×30 IMPLANT
SUT VIC AB 3-0 CTX 36 (SUTURE) IMPLANT
SUT VIC AB 3-0 SH 18 (SUTURE) IMPLANT
SUT VIC AB 3-0 SH 27 (SUTURE) ×2
SUT VIC AB 3-0 SH 27X BRD (SUTURE) ×4 IMPLANT
SYR 30ML LL (SYRINGE) ×6 IMPLANT
TOWEL OR 17X26 10 PK STRL BLUE (TOWEL DISPOSABLE) ×3 IMPLANT
TOWEL OR NON WOVEN STRL DISP B (DISPOSABLE) ×3 IMPLANT
TRAY FOLEY W/METER SILVER 16FR (SET/KITS/TRAYS/PACK) ×3 IMPLANT
UNDERPAD 30X30 (UNDERPADS AND DIAPERS) ×3 IMPLANT

## 2017-06-26 NOTE — Transfer of Care (Signed)
Immediate Anesthesia Transfer of Care Note  Patient: Ariel [name]  Procedure(s) Performed: Procedure(s): EXPLORATORY LAPAROTOMY (N/A) HYSTERECTOMY ABDOMINAL WITH BILATERAL  SALPINGO-OOPHORECTOMY (Bilateral)  Patient Location: PACU  Anesthesia Type:General  Level of Consciousness: awake, alert , oriented and patient cooperative  Airway & Oxygen Therapy: Patient Spontanous Breathing and Patient connected to face mask oxygen  Post-op Assessment: Report given to RN and Post -op Vital signs reviewed and stable  Post vital signs: Reviewed and stable  Last Vitals:  Vitals:   06/26/17 0515  BP: (!) 119/92  Pulse: 92  Resp: 16  Temp: 36.8 C  SpO2: 99%    Last Pain:  Vitals:   06/26/17 0515  TempSrc: Oral      Patients Stated Pain Goal: 4 (24/09/73 5329)  Complications: No apparent anesthesia complications

## 2017-06-26 NOTE — Interval H&P Note (Signed)
History and Physical Interval Note:  06/26/2017 7:11 AM  Ariel [name]  has presented today for surgery, with the diagnosis of PELVIC MASS  The various methods of treatment have been discussed with the patient and family. After consideration of risks, benefits and other options for treatment, the patient has consented to  Procedure(s): EXPLORATORY LAPAROTOMY (N/A) HYSTERECTOMY ABDOMINAL WITH BILATERAL  SALPINGO-OOPHORECTOMY (Bilateral) POSSIBLE  STAGING (N/A) as a surgical intervention .  The patient's history has been reviewed, patient examined, no change in status, stable for surgery.  I have reviewed the patient's chart and labs.  Questions were answered to the patient's satisfaction.     [name]

## 2017-06-26 NOTE — Discharge Instructions (Signed)
06/26/2017  Return to work: 6 weeks  Activity: 1. Be up and out of the bed during the day.  Take a nap if needed.  You may walk up steps but be careful and use the hand rail.  Stair climbing will tire you more than you think, you may need to stop part way and rest.   2. No lifting or straining for 6 weeks.  3. No driving for 2 weeks.  Do Not drive if you are taking narcotic pain medicine.  4. Shower daily.  Use soap and water on your incision and pat dry; don't rub.   5. No sexual activity and nothing in the vagina for 6 weeks.  Medications:  - Take ibuprofen and tylenol first line for pain control. Take these regularly (every 6 hours) to decrease the build up of pain.  - If necessary, for severe pain not relieved by ibuprofen, take percocet.  - While taking percocet you should take sennakot every night to reduce the likelihood of constipation. If this causes diarrhea, stop its use.  Diet: 1. Low sodium Heart Healthy Diet is recommended.  2. It is safe to use a laxative if you have difficulty moving your bowels.   Wound Care: 1. Keep clean and dry.  Shower daily.  Reasons to call the Doctor:   Fever - Oral temperature greater than 100.4 degrees Fahrenheit  Foul-smelling vaginal discharge  Difficulty urinating  Nausea and vomiting  Increased pain at the site of the incision that is unrelieved with pain medicine.  Difficulty breathing with or without chest pain  New calf pain especially if only on one side  Sudden, continuing increased vaginal bleeding with or without clots.   Follow-up: 1. See Everitt Amber in 4 weeks.  Contacts: For questions or concerns you should contact:  Dr. Everitt Amber at [phone] After hours and on week-ends call [phone] and ask to speak to the physician on call for Gynecologic Oncology

## 2017-06-26 NOTE — Anesthesia Preprocedure Evaluation (Addendum)
Anesthesia Evaluation  Patient identified by MRN, date of birth, ID band Patient awake    Reviewed: Allergy & Precautions, NPO status , Patient's Chart, lab work & pertinent test results  Airway Mallampati: II  TM Distance: >3 FB Neck ROM: Full    Dental  (+) Dental Advisory Given, Partial Upper   Pulmonary neg pulmonary ROS, Current Smoker,    Pulmonary exam normal        Cardiovascular hypertension, + Peripheral Vascular Disease   Rhythm:Regular Rate:Normal     Neuro/Psych negative neurological ROS     GI/Hepatic negative GI ROS, Neg liver ROS,   Endo/Other  Hypothyroidism   Renal/GU negative Renal ROS     Musculoskeletal negative musculoskeletal ROS (+)   Abdominal Normal abdominal exam  (+)   Peds  Hematology negative hematology ROS (+)   Anesthesia Other Findings - HLD  Reproductive/Obstetrics                         Lab Results  Component Value Date   WBC 4.6 06/18/2017   HGB 14.2 06/18/2017   HCT 41.7 06/18/2017   MCV 95.6 06/18/2017   PLT 195 06/18/2017   Lab Results  Component Value Date   CREATININE 0.82 06/18/2017   BUN 19 06/18/2017   NA 139 06/18/2017   K 3.8 06/18/2017   CL 104 06/18/2017   CO2 29 06/18/2017   No results found for: INR, PROTIME   EKG: normal sinus rhythm.  Anesthesia Physical Anesthesia Plan  ASA: II  Anesthesia Plan: General   Post-op Pain Management:    Induction: Intravenous  PONV Risk Score and Plan: 3 and Ondansetron, Dexamethasone and Midazolam  Airway Management Planned: Oral ETT  Additional Equipment:   Intra-op Plan:   Post-operative Plan: Extubation in OR  Informed Consent: I have reviewed the patients History and Physical, chart, labs and discussed the procedure including the risks, benefits and alternatives for the proposed anesthesia with the patient or authorized representative who has indicated his/her understanding  and acceptance.   Dental advisory given  Plan Discussed with: CRNA  Anesthesia Plan Comments:         Anesthesia Quick Evaluation

## 2017-06-26 NOTE — H&P (View-Only) (Signed)
New Patient Consult Note: Gyn-Onc  Ariel [name] 56 y.o. female  CC:  Chief Complaint  Patient presents with  . Uterine mass    HPI: Patient is seen today in consultation at the request of Dr. Dellis Filbert.  Patient is a very pleasant 56 year old gravida 2 para 2 who thought that she was gaining weight over the past year but felt that was upper portion, she was eating. She was without insurance her. Time and did not seek medical care or attention. Ultimately went she received insurance she went to get her goiter checked. She had previously been on thyroid medication but was off of it when she lost her insurance. She was doing home iodine therapy which helped with her TSH but it did not help the size of the goiter. During that process her primary care physician Dr. Colin Benton performed and abdominal examination mass was noted. She will return to CT scan that revealed:  CT scan 05/08/2017:   IMPRESSION: 1. Markedly enlarged uterus. Along the superior anterior aspect of the uterus, there is a mixed attenuation mass measuring 12.2 x 13.8x 9.5 cm. There is a more uniformly solid mass in the mid uterus measuring 5.4 x 6.3 x 6.8 cm. These lesions most likely represent leiomyomas, although the more complex superior mass could have a degree of sarcomatous degeneration. No other pelvic mass evident.  2. Occasional sigmoid diverticula without diverticulitis. No bowel wall thickening or bowel obstruction. No abscess. Appendix appears normal.  3.  No renal or ureteral calculus.  No hydronephrosis.  4.  Aortoiliac atherosclerosis.  5.  Small ventral hernia containing only fat.  6.  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  Large solid mass arising from the fundus of the Uterus measuring 16 x  10 x 15.5 cm with areas of positive CFD, arterial blood flow doppler.  Right Ovary not seen.  Left Ovary normal.  No FF in CDS.   She was subsequently referred to Dr. Dellis Filbert who referred her to [place]. Other than now being more cognizant of her abdomen getting larger she relieves remained asymptomatic. She's been menopausal for about 2 years and never took any hormone replacement therapy. Occasionally she'll have some burning in her abdomen which she thought was a ulcer but it is resolved spontaneously. Occasionally she'll have some spasms on her right side that similarly resolved spontaneously. She states that her bowel habits are not regular. She blames this on poor eating. Her weight is up and she weighs about 147 pounds. She states her baseline was usually 136. Her last mammogram was in 2002. She's never had a colonoscopy. Her last Pap smear was Dr. Dellis Filbert about 2 weeks ago. The only cancer in her family. Paternal grandmother with postmenopausal breast cancer. She is scheduled for a biopsy of her thyroid next week.  Review of Systems: Constitutional: Denies fever. No nausea but very sensitive to odors and smells. She thinks that she's gained about 11 pounds. Skin: No rash Cardiovascular: No chest pain, shortness of breath, + trace edema bilateral ankles, better now that she is taking her diuretics again Pulmonary: No cough Gastro Intestinal:  No nausea, vomiting, constipation, or diarrhea reported. Her bowels aren't as regular as she would like them to be and she attributes this to  not eating very well. Genitourinary: No frequency, urgency, or dysuria.  Denies vaginal bleeding and discharge.  Psychology: No changes  Current Meds:  Outpatient Encounter Prescriptions as of 06/11/2017  Medication Sig  . hydrochlorothiazide (HYDRODIURIL) 25 MG tablet Take 1 tablet (25 mg total) by mouth daily.   No facility-administered encounter medications on file as of 06/11/2017.     Allergy: No Known  Allergies  Social Hx:   Social History   Social History  . Marital status: Single    Spouse name: N/A  . Number of children: N/A  . Years of education: N/A   Occupational History  . Not on file.   Social History Main Topics  . Smoking status: Current Every Day Smoker  . Smokeless tobacco: Never Used     Comment: 4 CIGS A DAY  . Alcohol use Yes     Comment: OCC  . Drug use: Unknown  . Sexual activity: Yes    Partners: Male     Comment: 1ST INTERCOURSE- 4, PARTNERS- 78, CURRENT PARTNER- 3.5 YRS    Other Topics Concern  . 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 Christian, no church currenty      Lifestyle: "poor diet"; no exercise       Past Surgical Hx:  Past Surgical History:  Procedure Laterality Date  . CARDIAC ELECTROPHYSIOLOGY STUDY AND ABLATION    . TONSILLECTOMY  1967  . TUBAL LIGATION     X2, REVERSAL     Past Medical Hx:  Past Medical History:  Diagnosis Date  . Chronic bronchitis ([place])   . Goiter   . History of hypertension   . Hyperlipidemia   . Thyroid disease     Oncology Hx:   No history exists.    Family Hx:  Family History  Problem Relation Age of Onset  . Thyroid disease Maternal Aunt   . Hypertension Mother   . Renal Disease Mother   . Diabetes Father   . Hypertension Father   . Renal Disease Father   . Stroke Maternal Grandmother   . Cancer Paternal Grandmother        ?    Vitals:  Blood pressure 137/86, pulse 90, temperature 98.3 F (36.8 C), temperature source Oral, resp. rate 16, height 5' 3.5" (1.613 m), weight 141 lb 14.4 oz (64.4 kg), last menstrual period 05/30/2015.  Physical Exam: Well-nourished well-developed female in no acute distress.  Neck: Supple, prominent goiter on her left side. Nontender.  Lungs: Clear to auscultation laterally.  Cardiac: Regular rate rhythm.  Abdomen: Soft, nontender,  nondistended. Palpable mass just above the level of the umbilicus which is firm and fills the midline. The patient was allowed to fill the mass today. There is no hepatomegaly. There is no fluid wave. There is no rebound or guarding.  Groins: No lymphadenopathy.  Extremities: No edema.  Pelvic: Actual genitalia within normal limits. The cervix is without lesions or discharge. There is no bleeding. Bimanual examination reveals a 16-18 week size abdominal pelvic mass that appears to be arising from the uterus. There is no nodularity. Rectal confirms. I cannot appreciate any adnexal masses.  Assessment/Plan: 56 year old with an abdominal pelvic mass that appears to be arising from the uterus. She's had no bleeding. Therefore, in the absence of bleeding this could either be sarcoma or an otherwise degenerating myoma.  I discussed proceeding with total abdominal hysterectomy, bilateral salpingo-oophorectomy and frozen section with the patient. If this appears to be an epithelial type of malignancy that we would proceed with lymphadenectomy and appropriate staging. If is consistent with a sarcoma and there is no lymphadenopathy palpated, we would not necessarily proceed with lymphadenectomy as it is not indicated in uterine sarcoma.  She understands that any adjuvant therapy will depend on what the pathology reveals.   She was shown her CT images and found this to be very helpful. She wishes to proceed with surgery. Risks and benefits of surgery including but not limited to bleeding, infection, injury to surrounding organs, and thromboembolic disease were discussed with her. Her questions were elicited and answered to her satisfaction. She is tentatively scheduled for surgery for 06/26/2017 with Dr. Everitt Amber. She is aware that she'll have the opportunity to meet Dr. Denman George the morning of surgery. She knows that she can contact any of [place] with any questions prior to surgery.  She will contact her human  resources at work and will notify us if we need to complete any FMLA paperwork for her. She will most likely be going to [facility] to recuperate after his surgery. That is where her children live.  We appreciate the opportunity to partner in the care of this very pleasant patient.  Nancy Marus A., MD 06/11/2017, 2:48 PM

## 2017-06-26 NOTE — Anesthesia Procedure Notes (Signed)
Procedure Name: Intubation Date/Time: 06/26/2017 7:36 AM Performed by: Willeen Cass P Pre-anesthesia Checklist: Patient identified, Emergency Drugs available, Suction available and Patient being monitored Patient Re-evaluated:Patient Re-evaluated prior to induction Oxygen Delivery Method: Circle System Utilized Preoxygenation: Pre-oxygenation with 100% oxygen Induction Type: IV induction Ventilation: Mask ventilation without difficulty Laryngoscope Size: Mac and 3 Grade View: Grade I Tube type: Oral Tube size: 7.5 mm Number of attempts: 1 Airway Equipment and Method: Stylet Placement Confirmation: ETT inserted through vocal cords under direct vision,  positive ETCO2 and breath sounds checked- equal and bilateral Secured at: 21 cm Tube secured with: Tape Dental Injury: Teeth and Oropharynx as per pre-operative assessment

## 2017-06-26 NOTE — Anesthesia Postprocedure Evaluation (Signed)
Anesthesia Post Note  Patient: Ariel [name]  Procedure(s) Performed: Procedure(s) (LRB): EXPLORATORY LAPAROTOMY (N/A) HYSTERECTOMY ABDOMINAL WITH BILATERAL  SALPINGO-OOPHORECTOMY (Bilateral)     Patient location during evaluation: PACU Anesthesia Type: General Level of consciousness: awake and alert Pain management: pain level controlled Vital Signs Assessment: post-procedure vital signs reviewed and stable Respiratory status: spontaneous breathing, nonlabored ventilation, respiratory function stable and patient connected to nasal cannula oxygen Cardiovascular status: blood pressure returned to baseline and stable Postop Assessment: no signs of nausea or vomiting Anesthetic complications: no    Last Vitals:  Vitals:   06/26/17 1128 06/26/17 1228  BP: (!) 158/94 (!) 149/92  Pulse: 71 (!) 55  Resp: 15 14  Temp: 36.5 C 36.4 C  SpO2: 100% 100%    Last Pain:  Vitals:   06/26/17 1228  TempSrc: Oral  PainSc:                  [name]

## 2017-06-26 NOTE — Op Note (Signed)
PATIENT: Ariel [name] DATE: 06/26/17   Preop Diagnosis: uterine mass  Postoperative Diagnosis: uterine fibroids  Surgery: Total abdominal hysterectomy, bilateral salpingo-oophorectomy  Surgeons:  Everitt Amber, MD; Lahoma Crocker, MD (an MD assistant was necessary for tissue manipulation, retraction and positioning due to the complexity of the case and hospital policies).   Anesthesia: General   Estimated blood loss: 100 ml  IVF: 1000 ml   Urine output: 469 ml   Complications: None   Pathology: Uterus, cervix, bilateral tubes and ovaries  Operative findings: 20cm fibroid uterus, benign fibroid on frozen section. Normal appearing ovaries. Normal appendix and upper abdomen  Procedure: The patient was identified in the preoperative holding area. Informed consent was signed on the chart. Patient was seen history was reviewed and exam was performed.   The patient was then taken to the operating room and placed in the supine position with SCD hose on. General anesthesia was then induced without difficulty. She was then placed in the dorsolithotomy position. The abdomen was prepped with chlor prep sponges per protocol. Perineum was prepped with Betadine. The vagina was prepped with Betadine a Foley catheter was inserted into the bladder under sterile conditions.  The patient was then draped after the prep was dried. Timeout was performed the patient, procedure, antibiotic, allergy, and length of procedure. A vertical midline infraumbilical incision was and carried down to the underlying fascia using Bovie cautery. The fascia was scored in the fascial incision was extended superiorly and inferiorly using Bovie cautery. The rectus bellies were dissected off the overlying fascia. The peritoneum was tented and entered. The peritoneal incision was extended superiorly and inferiorly with visualization of the underlying peritoneal cavity. The Buchwalter self-retaining retractor was then  placed. At the initial placement as well as at several points during the case the lateral blades were checked to ensure no significant pressure on the psoas bellies.  The small and large bowel were packed out of the way of the surgical field with moist laparotomy sponges and malleable retractors were attached to the [place]. Abdominal pelvic washings were obtained. The round ligament on the patient's right side was transected with monopolar cautery the anterior posterior leaves the broad ligament were opened. A window was made between the infundibulopelvic vessels and the ureter. The vessels were clamped x2 transected and suture ligated. The bladder flap was created at this point and the uterine vessels on the right side were skeletonized. The uterine vessels were clamped with curved Masterson clamps transected and suture ligated. Our attention was turned to the left side were similar procedure was performed in that the round ligament was transected and the anterior and posterior leaves the broad ligament were opened. A window was made between the vessels and the ureter on the left and the vessels were clamped x2 transected and suture ligated. The uterine vessels were skeletonized clamped x2 transected and suture ligated. We continued down the cardinal ligaments with straight Masterson clamps the cervicovaginal junction was reached. We came across the vagina just under the cervix with curved Masterson clamps. The cervix was amputated from the vagina. The angle pedicles as well as vaginal cuff were closed using figure-of-eight sutures of 0 Vicryl.  At this point frozen section returned as benign fibroid. The pedicles were noted to be hemostatic. The abdomen pelvis were copiously irrigated. The retractor and laparotomy sponges were removed. The fascia was closed using running mass closure of #1 PDS. The subcutaneous tissues were irrigated and made hemostatic. 20 mL of Exparel within  20 mL of marcaine and 20 mL  of normal saline was injected for postoperative pain control. The skin was closed using subcuticular suture.  All instrument, suture, laparotomy, Ray-Tec, and needle counts were correct x2. The patient tolerated the procedure well and was taken recovery room in stable condition. This is Everitt Amber dictating an operative note on Carlette Palmatier.

## 2017-06-27 LAB — BASIC METABOLIC PANEL
ANION GAP: 8 (ref 5–15)
BUN: 11 mg/dL (ref 6–20)
CO2: 27 mmol/L (ref 22–32)
Calcium: 9.1 mg/dL (ref 8.9–10.3)
Chloride: 100 mmol/L — ABNORMAL LOW (ref 101–111)
Creatinine, Ser: 0.83 mg/dL (ref 0.44–1.00)
GFR calc Af Amer: >60 mL/min (ref >60)
GFR calc non Af Amer: >60 mL/min (ref >60)
GLUCOSE: 117 mg/dL — AB (ref 65–99)
POTASSIUM: 3.8 mmol/L (ref 3.5–5.1)
Sodium: 135 mmol/L (ref 135–145)

## 2017-06-27 LAB — CBC
HEMATOCRIT: 35.7 % — AB (ref 36.0–46.0)
Hemoglobin: 12.3 g/dL (ref 12.0–15.0)
MCH: 32.7 pg (ref 26.0–34.0)
MCHC: 34.5 g/dL (ref 30.0–36.0)
MCV: 94.9 fL (ref 78.0–100.0)
PLATELETS: 149 10*3/uL — AB (ref 150–400)
RBC: 3.76 MIL/uL — AB (ref 3.87–5.11)
RDW: 11.8 % (ref 11.5–15.5)
WBC: 8.4 10*3/uL (ref 4.0–10.5)

## 2017-06-27 MED ORDER — SIMETHICONE 80 MG PO CHEW
80.0000 mg | CHEWABLE_TABLET | Freq: Four times a day (QID) | ORAL | Status: DC | PRN
  Administered 2017-06-27: 80 mg via ORAL
  Filled 2017-06-27: qty 1

## 2017-06-27 MED ORDER — PROCHLORPERAZINE 25 MG RE SUPP
25.0000 mg | Freq: Two times a day (BID) | RECTAL | Status: DC | PRN
  Administered 2017-06-27: 25 mg via RECTAL
  Filled 2017-06-27: qty 1

## 2017-06-27 MED ORDER — SALINE SPRAY 0.65 % NA SOLN
1.0000 | NASAL | Status: DC | PRN
  Administered 2017-06-28: 1 via NASAL
  Filled 2017-06-27: qty 44

## 2017-06-27 NOTE — Progress Notes (Signed)
Patient ID: Ariel [name], female   DOB: 09/14/1961, 56 y.o.   MRN: [id number] 1 Day Post-Op Procedure(s) (LRB): EXPLORATORY LAPAROTOMY (N/A) HYSTERECTOMY ABDOMINAL WITH BILATERAL  SALPINGO-OOPHORECTOMY (Bilateral)  Subjective: Patient reports nausea and emesis last night. Now resolved. Pain well controlled. Voiding urine without issue, ambulating well.    Objective: Vital signs in last 24 hours: Temp:  [97.4 F (36.3 C)-99.2 F (37.3 C)] 98.2 F (36.8 C) (09/07 0523) Pulse Rate:  [55-80] 72 (09/07 0523) Resp:  [14-16] 16 (09/07 0523) BP: (124-189)/(73-96) 124/73 (09/07 0523) SpO2:  [100 %] 100 % (09/07 0523) Last BM Date: 06/27/17  Intake/Output from previous day: 09/06 0701 - 09/07 0700 In: 2680.8 [P.O.:600; I.V.:2080.8] Out: 5631 [Urine:3560; Blood:100]  Physical Examination: General: cooperative Resp: clear to auscultation bilaterally Cardio: regular rate and rhythm, S1, S2 normal, no murmur, click, rub or gallop GI: soft, non-tender; bowel sounds normal; no masses,  no organomegaly and incision: clean, dry, intact and with dressing Extremities: extremities normal, atraumatic, no cyanosis or edema  Labs: WBC/Hgb/Hct/Plts:  8.4/12.3/35.7/149 (09/07 0501) BUN/Cr/glu/ALT/AST/amyl/lip:  11/0.83/--/--/--/--/-- (09/07 0501)   Assessment:  56 y.o. s/p Procedure(s): EXPLORATORY LAPAROTOMY HYSTERECTOMY ABDOMINAL WITH BILATERAL  SALPINGO-OOPHORECTOMY: stable Pain:  Pain is well-controlled on oral medications.  Heme:appropriate postop Hb  CV: hemodynamically stable  GI:  Tolerating po: Yes Recommended to go gentle on diet as she may have some ileus  FEN: decrease IVF rate and hep lock later today if tolerating PO  Prophylaxis: pharmacologic prophylaxis (with any of the following: enoxaparin (Lovenox) 40mg  SQ 2 hours prior to surgery then every day).  Plan: Advance diet Encourage ambulation Advance to PO medication doing well Dispo:  Discharge plan to include  :consults: The patient is to be discharged to anticipate discharge tomorrow on POD2.   LOS: 1 day    [name] 06/27/2017, 11:47 AM

## 2017-06-28 MED ORDER — OXYCODONE-ACETAMINOPHEN 5-325 MG PO TABS: 1.0000 | ORAL_TABLET | Freq: Four times a day (QID) | ORAL | 0 refills | Status: DC | PRN

## 2017-06-28 NOTE — Progress Notes (Signed)
Patient ID: Ariel [name], female   DOB: 05-07-1961, 56 y.o.   MRN: [id number] 2 Days Post-Op Procedure(s) (LRB): EXPLORATORY LAPAROTOMY (N/A) HYSTERECTOMY ABDOMINAL WITH BILATERAL  SALPINGO-OOPHORECTOMY (Bilateral)  Subjective: No N/V/D.  +flatus/gas pain resolved.  Ambulating well however c/o incisional pain-->IV narcotic for breakthrough pain.    Objective: Vital signs in last 24 hours: Temp:  [97.8 F (36.6 C)-99.7 F (37.6 C)] 97.8 F (36.6 C) (09/08 0515) Pulse Rate:  [75-94] 75 (09/08 0515) Resp:  [16] 16 (09/08 0515) BP: (117-159)/(73-103) 117/73 (09/08 0515) SpO2:  [99 %-100 %] 100 % (09/08 0515) Last BM Date: 06/27/17  Intake/Output from previous day: 09/07 0701 - 09/08 0700 In: 480 [P.O.:480] Out: -   Physical Examination: General: cooperative Resp: clear to auscultation bilaterally Cardio: regular rate and rhythm, S1, S2 normal, no murmur, click, rub or gallop GI: soft, non-tender; bowel sounds normal; no masses,  no organomegaly and incision: clean, dry, intact and with dressing Extremities: extremities normal, atraumatic, no cyanosis or edema  Labs:       Assessment:  56 y.o. s/p Procedure(s): EXPLORATORY LAPAROTOMY HYSTERECTOMY ABDOMINAL WITH BILATERAL  SALPINGO-OOPHORECTOMY: stable Pain:  Pain is controlled on oral medications; dosed for incisional/breakthrough pain after ambulating .  GI:  Tolerating po: Yes  Prophylaxis: pharmacologic prophylaxis (with any of the following: enoxaparin (Lovenox) 40mg  SQ 2 hours prior to surgery then every day).  Plan:  doing well Encourage ambulation K-pad Anticipate discharge later today   LOS: 2 days    JACKSON-MOORE,Mayrani Khamis A 06/28/2017, 9:47 AM     Patient ID: Ariel [name], female   DOB: Jan 04, 1961, 56 y.o.   MRN: [id number]

## 2017-06-29 MED ORDER — HYDROCHLOROTHIAZIDE 25 MG PO TABS
25.0000 mg | ORAL_TABLET | Freq: Every day | ORAL | Status: DC
  Administered 2017-06-29: 25 mg via ORAL
  Filled 2017-06-29: qty 1

## 2017-06-29 NOTE — Discharge Summary (Signed)
Physician Discharge Summary  Patient ID: Ariel [name] MRN: [id number] DOB/AGE: 1961-08-31 56 y.o.  Admit date: 06/26/2017 Discharge date: 06/29/2017  Admission Diagnoses: Abdominal mass  Discharge Diagnoses:  Principal Problem:   Abdominal mass Active Problems:   Fibroids, intramural   Uterine fibroid   Discharged Condition: good  Hospital Course: On 06/26/2017, the patient underwent the following: Procedure(s): EXPLORATORY LAPAROTOMY HYSTERECTOMY ABDOMINAL WITH BILATERAL  SALPINGO-OOPHORECTOMY.   The postoperative course was uneventful.  She was discharged to home on postoperative day 3 tolerating a regular diet.  Consults: None  Significant Diagnostic Studies: None  Treatments: surgery: see above  Discharge Exam: Blood pressure 133/79, pulse 69, temperature 97.9 F (36.6 C), temperature source Oral, resp. rate 16, height 5' 3.25" (1.607 m), weight 147 lb (66.7 kg), last menstrual period 05/30/2015, SpO2 100 %. General appearance: alert Resp: clear to auscultation bilaterally Cardio: regular rate and rhythm, S1, S2 normal, no murmur, click, rub or gallop GI: soft, non-tender; bowel sounds normal; no masses,  no organomegaly Extremities: extremities normal, atraumatic, no cyanosis or edema Incision/Wound: C/D/I  Disposition: Final discharge disposition not confirmed  Discharge Instructions    Activity as tolerated - No restrictions    Complete by:  As directed    Call MD for:  extreme fatigue    Complete by:  As directed    Call MD for:  persistant dizziness or light-headedness    Complete by:  As directed    Call MD for:  persistant nausea and vomiting    Complete by:  As directed    Call MD for:  redness, tenderness, or signs of infection (pain, swelling, redness, odor or green/yellow discharge around incision site)    Complete by:  As directed    Call MD for:  severe uncontrolled pain    Complete by:  As directed    Call MD for:  temperature >100.4    Complete  by:  As directed    Diet - low sodium heart healthy    Complete by:  As directed    Discharge instructions    Complete by:  As directed    Planning for Recovery and Going Home Your Guide to Gynecologic Surgery    In-Hospital Recovery Plan Team Caring for You After Surgery In addition to the nursing staff on the unit, the gynecological surgery team will care for you. This team is led by your surgeon and includes medical students and a physician assistant or nurse practitioner. There will be a physician in the hospital 24 hours a day to tend to your needs. The students report directly to your surgeon, who is the one overseeing all of your care.  Pain Relief After Surgery Your pain will be assessed regularly on a scale from 0 to 10. Pain assessment is  necessary to guide your pain relief. It is essential that you are able to take deep breaths, cough and move. Prevention or early treatment of pain is far more effective than trying to treat severe pain. Therefore, we have devised a specialized regimen to stay ahead of your pain and use almost no narcotics, which can slow down your recovery process. If you have an epidural catheter, you will receive a  constant infusion of pain medication through your epidural. If you need additional pain relief, you will be able to push a button to increase the medication in your epidural. You will also be given acetaminophen and an ibuprofen-like medication to keep your pain under control.  You can always ask  for additional pain pills if you are not comfortable. In most cases an anesthesiologist with expertise in pain management will visit you every day and help design your pain management plan.  One Day After Surgery Focus on drinking and walking. You will start drinking clear liquids after surgery. The intravenous fluids will be stopped, and the catheter may be removed  from your bladder. We expect you to get out of bed, with the nurses' or  assistants' help, sit in a chair for six hours and start to move about in the hallways. You will also meet with a case manager to assess your discharge needs, including home nursing. Your physician may order home care to assist with your transition home.  Home nursing visits, which are intermittent, help you get readjusted to home by teaching treatments, monitoring medications, and performing clinical assessment and reporting back to your physician. Other services may include therapy and medical equipment; private duty services are also available. If you are going "home" to a different address upon discharge, please alert [place]. A Home Care Coordinator can visit with you while in the hospital to discuss your options. If you have questions please speak with your case manager. If you need rehabilitation at a facility, a social worker will assist with this. If you need rehabilitation at a facility, a social worker will assist with this. If your procedure was performed in a minimally invasive fashion, you will be discharged to home if your pain is well controlled and you are tolerating a regular diet.     Two Days After Surgery You will start eating a soft diet and change to a more solid diet as you feel up to it. The catheter from your bladder will be removed, if not already done so. If there is a dressing on your wound, it will be removed. The tubing will be disconnected from your IV. We expect you to be out of bed for the majority of the day and walking at least three times in the hallway, with assistance as needed.  You may be discharged at this point if it is felt you are ready.   Three Days After Surgery You continue to eat your low residue diet. You may be ready to go home if you are drinking enough to keep yourself hydrated, your pain is well controlled, you are not belching or nauseated, you are passing gas and you are able to get around on your own. However, we will not discharge you from  the hospital until we are sure you are ready.  Discharge Discharge time is at 10 a.m. You will need to make arrangements for someone to accompany you home. You will not be released without someone present. Please keep in mind that we strive to get patients discharged as quickly as possible, but there may be delays for a variety of reasons. Complications That May Delay Discharge: ? Nausea and vomiting: It is very common to feel sick after your surgery. We give you medication to reduce this. However, if you do feel sick, you should reduce the amount you are taking by mouth. Small, frequent meals or drinks are best in this  situation. As long as you can drink and keep yourself hydrated, the nausea will likely pass.  Ileus: Following surgery, the bowel can be sluggish, making it difficult for food and gas to pass through the intestines. This is called an ileus. We have designed our care program to do everything possible to reduce the likelihood of an  ileus. If you do develop an ileus, it usually only lasts two to three days. However, it may require a small tube down the nose to decompress the stomach. The best way to avoid an ileus is to reduce the amount of narcotic pain medications, get up as much as possible after your surgery, and stimulate the bowel early after surgery with small amounts of food and liquids.  Wound infection: If a wound infection develops, this usually happens three to ten days after surgery.    Urinary retention: This is if you are unable to urinate after the catheter from your bladder is removed. The catheter may need to be reinserted until you are able to urinate on your own. This can be caused by anesthesia, pain medication and decreased activity.    When you are preparing to go home, you will receive:  Detailed discharge instructions, with information about your operation and medications    All prescriptions for medications you need at home; prescriptions can  be filled while you are in the hospital if you would like    You may be prescribed Lovenox. Lovenox is used to reduce the risk of developing a blood clot after surgery. An appointment to see your surgeon or provider one to two weeks after you leave the hospital for follow-up   After Discharge Once you are discharged: Call us at any time if you are worried about your recovery or if you should have any questions. During regular office hours, (8:30 a.m.-4:30 p.m.), and after hours call [phone].  Call us immediately if:  You have a fever higher than 100.4 degrees.   Your wound is red, more painful or has drainage.    You are nauseated, vomiting or can't keep liquids down.    Your pain is worse and not able to be controlled with the regimen you were sent home with.    If you are bleeding heavily or have a lot of fluid coming from your vagina. If you are on narcotics, the goal is to wean you off of them. If you are running low on supply and need more, call the nurse a few days before you will run out.  It is generally easier to reach someone between 8:30 a.m. - 4:30 p.m., so call early if you think something is not right. A nurse or nurse practitioner is available every day to answer your questions. After hours and on the weekends, the calls go to the resident doctors in the hospital. It may take longer for your phone call to be returned during this time. If you have a true emergency, such as severe abdominal pain, chest pain, shortness of breath or any other acute issues, call 911 and go to the local emergency room. Have them contact our team once you are stable.  Concerns After Discharge Bowel Function Following Your Surgery Your bowels will take several weeks to settle down and may be unpredictable at first. Your bowel movements may become loose, or you may be constipated. For the vast number of patients, this will get back to normal with time. Make sure you eat nutritious  meals, drink plenty of fluids and take regular walks during the first two weeks after your operation. Your Guide to Gynecologic Surgery   Abdominal Pain It is not unusual to suffer gripping pains (colic) during the first week following removal of a portion of your bowel. This pain usually lasts for a few minutes but goes away between spasms. If you have severe  pain lasting more than one to two hours or have a fever and feel generally unwell, you should contact us at  the telephone contact numbers listed at the end of this packet. Hysterectomy: You should have pelvic rest for six (6) weeks or as specified by your doctor after surgery. You should have nothing in the vagina (no tampons, douching, intercourse, etc.,) during this time period. If you have some vaginal spotting, this is normal. If you have heavy bleeding or  a lot of fluid from your vagina, this is NOT normal and you should contact your doctor's office or, if after hours, contact the doctor on call.  Diarrhea: Fiber and Imodium (Loperamide) The first step to improving your frequent or loose stools is to bulk up the stool with fiber. Metamucil is the most common type of fiber that is available at any drug store. Start with 1 teaspoon mixed into food, like yogurt or oatmeal, in the morning and evening. Try not to drink any fluid for one hour after you take the fiber. This will allow the fiber to act like a sponge in your intestines, soaking up all the excess water. Continue this for three to five days. You may increase by 1 teaspoon every three to five days until the desired affect, or you are at 1 tablespoon (3 teaspoons) twice a day. If this doesn't work, you may try over-the-counter Loperamide, which is an antidiarrheal medication. You may take one tablet in the morning and evening or 30 minutes before you typically have diarrhea. You may take up to eight of these tablets daily. It is best to discuss this with [place] prior to  using this medication. If you have continuous diarrhea and abdominal cramping, call [phone].  Foley Catheter Your surgeon may recommend you be discharged home with a foley catheter (bladder catheter) for 1 to 2 weeks. Typically this recommendation will be made for patients undergoing surgery to the lower urinary tract. Before you leave the hospital, your nurse should outfit you with a clip on the inner thigh to secure the catheter to prevent pulling as well as a small bag that can be easily worn on the upper leg under loose fitting pants and skirts. Your nurse will teach you how to exchange the large bag that typically comes with the catheter for the small bag. You may find it convenient to attach the small bag when active during the day and then the large bag when sleeping at night. If there is ever a point when you notice the catheter is not draining urine and youbegin to develop pain behind/above the pubic bone, you should report to the clinic or emergency room immediately as the catheter may be kinked or clogged. Kinking or clogging of the catheter prevents urine from draining from your bladder. Urine will quickly build up in the bladder and can cause severe pain as well as seriously disrupt healing if you have undergone surgery on the lower urinary tract. Additionally, pulling on the catheter can result in displacement of the balloon at the end of the catheter from inside of to outside  of the bladder. This also results in severe pain and can cause bleeding. For this reason, secure the catheter to the clip on your inner thigh at all times as the clip prevents against pulling.  Wound Care For the first few weeks following surgery, your wound may be slightly red and uncomfortable. You may shower and let the soapy water wash over your incision. Avoid soaking  in the tub for one month following surgery or until the wound is well healed. It will take the wound several months to "soften." It  is common to have bumpy areas in the wound near the belly  button and at the ends of the incision.  If you have staples, these should be removed when you are seen by your surgeon at the follow-up appointment. You may have a glue-like material on your incision. Do not pick at this. It will come off over time. It is the surgical glue used in surgery to close your incision. You also have sutures inside of you that will dissolve over time  Post-Surgery Diet Attention to good nutrition after surgery is important to your recovery. If you had no dietary restrictions prior to the surgery, you will have no special dietary restrictions after the surgery. However, consuming enough protein, calories, vitamins and minerals is necessary to support healing. Some patients find their appetite is less than normal after surgery. In this case, frequent small meals throughout the day may help. It is not uncommon to lose 10 to 15 pounds after surgery. However, by the fourth to fifth week, your weight loss should stabilize. It is normal that certain foods taste different and certain smells may make you nauseas. Over time, the amount you can comfortably consume will gradually increase. You should try to eat a balanced diet, which includes:  Foods that are soft, moist, and easy to chew and swallow    Canned or soft-cooked fruits and vegetables   Plenty of soft breads, rice, pasta, potatoes and other starchy foods (lowerfiber  varieties may be tolerated better initially)    High-protein foods and beverages, such as meats, eggs, milk, cottage cheese  or a supplemental nutrition drink like Boost or Ensure    Drink plenty of fluids-at least 8 to 10 cups per day. This includes water,  fruit juice, Gatorade, teas/coffee and milk. Drinking plenty is especially important if you have loose stools (diarrhea).   Avoid drinking a lot of caffeine, since this may dehydrate you.    Avoid fried, greasy and highly  seasoned or spicy foods.    Avoid carbonated beverages in the first couple weeks.    Avoid raw fruits and vegetables.   Hobbies/Activities Walking is encouraged after your surgery. You should plan to undertake regular exercise several times a day and gradually increase this during the four weeks following your operation until you are back to your normal level of activity. You may climb stairs. Don't do any heavy lifting greater than 10 pounds or contact sports for the first month after your surgery. Generally, you can return to hobbies and activities soon after your surgery. This will help you recover. It can take up to two to three months to fully recover. It is not unusual to be fatigued and require an afternoon nap for up to six to eight weeks following surgery. Your body is using this energy to heal your wounds. Set small goals for yourself and try to do a little more each day.  Work It is normal to return to work three to six weeks following your operation. If your job involves heavy manual work, then you should wait six weeks. However, you should check with your employer regarding rules, which may be relevant to your return to work. If you need a return-to-work form for your employer or disability papers, bring them to your follow-up appointment or fax them to our office at [phone].  Driving  You may drive when you are off narcotics and pain-free enough to react quickly with your braking foot. For most patients, this occurs at one to four weeks following surgery.   Write down any questions you may have to ask your care team.  Important Contact Numbers: GYN Oncology Office: [phone]   Discharge wound care:    Complete by:  As directed    Keep clean and dry   Driving Restrictions    Complete by:  As directed    No driving for 1- 2 weeks   Increase activity slowly    Complete by:  As directed    Lifting restrictions    Complete by:  As directed    No lifting  > 5 lbs for 6 weeks   May shower / Bathe    Complete by:  As directed    No tub baths for 6 weeks   Sexual Activity Restrictions    Complete by:  As directed    No intercourse for 6 - 8 weeks     Allergies as of 06/29/2017   No Known Allergies     Medication List    TAKE these medications   hydrochlorothiazide 25 MG tablet Commonly known as:  HYDRODIURIL Take 1 tablet (25 mg total) by mouth daily.   nicotine 14 mg/24hr patch Commonly known as:  [employer] NICOTINE TRANSDERMAL SYS Place 1 patch (14 mg total) onto the skin daily.   nicotine 7 mg/24hr patch Commonly known as:  [employer] NICOTINE Place 1 patch (7 mg total) onto the skin daily.   oxyCODONE-acetaminophen 5-325 MG tablet Commonly known as:  PERCOCET/ROXICET Take 1-2 tablets by mouth every 6 (six) hours as needed for severe pain.            Discharge Care Instructions        Start     Ordered   06/29/17 0000  Activity as tolerated - No restrictions     06/29/17 1127   06/29/17 0000  Increase activity slowly     06/29/17 1127   06/29/17 0000  May shower / Bathe    Comments:  No tub baths for 6 weeks   06/29/17 1127   06/29/17 0000  Driving Restrictions    Comments:  No driving for 1- 2 weeks   06/29/17 1127   06/29/17 0000  Lifting restrictions    Comments:  No lifting > 5 lbs for 6 weeks   06/29/17 1127   06/29/17 0000  Sexual Activity Restrictions    Comments:  No intercourse for 6 - 8 weeks   06/29/17 1127   06/29/17 0000  Diet - low sodium heart healthy     06/29/17 1127   06/29/17 0000  Discharge wound care:    Comments:  Keep clean and dry   06/29/17 1127   06/29/17 0000  Call MD for:  temperature >100.4     06/29/17 1127   06/29/17 0000  Call MD for:  persistant nausea and vomiting     06/29/17 1127   06/29/17 0000  Call MD for:  severe uncontrolled pain     06/29/17 1127   06/29/17 0000  Call MD for:  redness, tenderness, or signs of infection (pain, swelling, redness, odor or green/yellow  discharge around incision site)     06/29/17 1127   06/29/17 0000  Call MD for:  persistant dizziness or light-headedness     06/29/17 1127   06/29/17 0000  Call MD for:  extreme fatigue  06/29/17 1127   06/29/17 0000  Discharge instructions    Comments:  Planning for Recovery and Going Home Your Guide to Gynecologic Surgery    In-Hospital Recovery Plan Team Caring for You After Surgery In addition to the nursing staff on the unit, the gynecological surgery team will care for you. This team is led by your surgeon and includes medical students and a physician assistant or nurse practitioner. There will be a physician in the hospital 24 hours a day to tend to your needs. The students report directly to your surgeon, who is the one overseeing all of your care.  Pain Relief After Surgery Your pain will be assessed regularly on a scale from 0 to 10. Pain assessment is  necessary to guide your pain relief. It is essential that you are able to take deep breaths, cough and move. Prevention or early treatment of pain is far more effective than trying to treat severe pain. Therefore, we have devised a specialized regimen to stay ahead of your pain and use almost no narcotics, which can slow down your recovery process. If you have an epidural catheter, you will receive a  constant infusion of pain medication through your epidural. If you need additional pain relief, you will be able to push a button to increase the medication in your epidural. You will also be given acetaminophen and an ibuprofen-like medication to keep your pain under control.  You can always ask for additional pain pills if you are not comfortable. In most cases an anesthesiologist with expertise in pain management will visit you every day and help design your pain management plan.  One Day After Surgery Focus on drinking and walking. You will start drinking clear liquids after surgery. The intravenous fluids will be  stopped, and the catheter may be removed  from your bladder. We expect you to get out of bed, with the nurses' or assistants' help, sit in a chair for six hours and start to move about in the hallways. You will also meet with a case manager to assess your discharge needs, including home nursing. Your physician may order home care to assist with your transition home.  Home nursing visits, which are intermittent, help you get readjusted to home by teaching treatments, monitoring medications, and performing clinical assessment and reporting back to your physician. Other services may include therapy and medical equipment; private duty services are also available. If you are going "home" to a different address upon discharge, please alert [place]. A Home Care Coordinator can visit with you while in the hospital to discuss your options. If you have questions please speak with your case manager. If you need rehabilitation at a facility, a social worker will assist with this. If you need rehabilitation at a facility, a social worker will assist with this. If your procedure was performed in a minimally invasive fashion, you will be discharged to home if your pain is well controlled and you are tolerating a regular diet.     Two Days After Surgery You will start eating a soft diet and change to a more solid diet as you feel up to it. The catheter from your bladder will be removed, if not already done so. If there is a dressing on your wound, it will be removed. The tubing will be disconnected from your IV. We expect you to be out of bed for the majority of the day and walking at least three times in the hallway, with assistance as needed.  You may be discharged at this point if it is felt you are ready.   Three Days After Surgery You continue to eat your low residue diet. You may be ready to go home if you are drinking enough to keep yourself hydrated, your pain is well controlled, you are not belching or  nauseated, you are passing gas and you are able to get around on your own. However, we will not discharge you from the hospital until we are sure you are ready.  Discharge Discharge time is at 10 a.m. You will need to make arrangements for someone to accompany you home. You will not be released without someone present. Please keep in mind that we strive to get patients discharged as quickly as possible, but there may be delays for a variety of reasons. Complications That May Delay Discharge: ? Nausea and vomiting: It is very common to feel sick after your surgery. We give you medication to reduce this. However, if you do feel sick, you should reduce the amount you are taking by mouth. Small, frequent meals or drinks are best in this  situation. As long as you can drink and keep yourself hydrated, the nausea will likely pass.  Ileus: Following surgery, the bowel can be sluggish, making it difficult for food and gas to pass through the intestines. This is called an ileus. We have designed our care program to do everything possible to reduce the likelihood of an ileus. If you do develop an ileus, it usually only lasts two to three days. However, it may require a small tube down the nose to decompress the stomach. The best way to avoid an ileus is to reduce the amount of narcotic pain medications, get up as much as possible after your surgery, and stimulate the bowel early after surgery with small amounts of food and liquids.  Wound infection: If a wound infection develops, this usually happens three to ten days after surgery.    Urinary retention: This is if you are unable to urinate after the catheter from your bladder is removed. The catheter may need to be reinserted until you are able to urinate on your own. This can be caused by anesthesia, pain medication and decreased activity.    When you are preparing to go home, you will receive:  Detailed discharge instructions, with  information about your operation and medications    All prescriptions for medications you need at home; prescriptions can be filled while you are in the hospital if you would like    You may be prescribed Lovenox. Lovenox is used to reduce the risk of developing a blood clot after surgery. An appointment to see your surgeon or provider one to two weeks after you leave the hospital for follow-up   After Discharge Once you are discharged: Call us at any time if you are worried about your recovery or if you should have any questions. During regular office hours, (8:30 a.m.-4:30 p.m.), and after hours call [phone].  Call us immediately if:  You have a fever higher than 100.4 degrees.   Your wound is red, more painful or has drainage.    You are nauseated, vomiting or can't keep liquids down.    Your pain is worse and not able to be controlled with the regimen you were sent home with.    If you are bleeding heavily or have a lot of fluid coming from your vagina. If you are on narcotics, the goal is to wean you  off of them. If you are running low on supply and need more, call the nurse a few days before you will run out.  It is generally easier to reach someone between 8:30 a.m. - 4:30 p.m., so call early if you think something is not right. A nurse or nurse practitioner is available every day to answer your questions. After hours and on the weekends, the calls go to the resident doctors in the hospital. It may take longer for your phone call to be returned during this time. If you have a true emergency, such as severe abdominal pain, chest pain, shortness of breath or any other acute issues, call 911 and go to the local emergency room. Have them contact our team once you are stable.  Concerns After Discharge Bowel Function Following Your Surgery Your bowels will take several weeks to settle down and may be unpredictable at first. Your bowel movements may become loose, or you  may be constipated. For the vast number of patients, this will get back to normal with time. Make sure you eat nutritious meals, drink plenty of fluids and take regular walks during the first two weeks after your operation. Your Guide to Gynecologic Surgery   Abdominal Pain It is not unusual to suffer gripping pains (colic) during the first week following removal of a portion of your bowel. This pain usually lasts for a few minutes but goes away between spasms. If you have severe pain lasting more than one to two hours or have a fever and feel generally unwell, you should contact us at  the telephone contact numbers listed at the end of this packet. Hysterectomy: You should have pelvic rest for six (6) weeks or as specified by your doctor after surgery. You should have nothing in the vagina (no tampons, douching, intercourse, etc.,) during this time period. If you have some vaginal spotting, this is normal. If you have heavy bleeding or  a lot of fluid from your vagina, this is NOT normal and you should contact your doctor's office or, if after hours, contact the doctor on call.  Diarrhea: Fiber and Imodium (Loperamide) The first step to improving your frequent or loose stools is to bulk up the stool with fiber. Metamucil is the most common type of fiber that is available at any drug store. Start with 1 teaspoon mixed into food, like yogurt or oatmeal, in the morning and evening. Try not to drink any fluid for one hour after you take the fiber. This will allow the fiber to act like a sponge in your intestines, soaking up all the excess water. Continue this for three to five days. You may increase by 1 teaspoon every three to five days until the desired affect, or you are at 1 tablespoon (3 teaspoons) twice a day. If this doesn't work, you may try over-the-counter Loperamide, which is an antidiarrheal medication. You may take one tablet in the morning and evening or 30 minutes before  you typically have diarrhea. You may take up to eight of these tablets daily. It is best to discuss this with [place] prior to using this medication. If you have continuous diarrhea and abdominal cramping, call [phone].  Foley Catheter Your surgeon may recommend you be discharged home with a foley catheter (bladder catheter) for 1 to 2 weeks. Typically this recommendation will be made for patients undergoing surgery to the lower urinary tract. Before you leave the hospital, your nurse should outfit you with a clip on.Marland KitchenMarland Kitchen  06/29/17 1127   06/28/17 0000  oxyCODONE-acetaminophen (PERCOCET/ROXICET) 5-325 MG tablet  Every 6 hours PRN     06/28/17 0955       Signed: Lahoma Crocker A 06/29/2017, 11:30 AM

## 2017-07-04 ENCOUNTER — Encounter: Payer: Self-pay | Admitting: Family Medicine

## 2017-07-10 ENCOUNTER — Encounter: Payer: Self-pay | Admitting: Family Medicine

## 2017-07-14 ENCOUNTER — Encounter: Payer: Self-pay | Admitting: Gynecologic Oncology

## 2017-07-15 ENCOUNTER — Telehealth: Payer: Self-pay | Admitting: *Deleted

## 2017-07-15 ENCOUNTER — Encounter: Payer: Self-pay | Admitting: Family Medicine

## 2017-07-15 NOTE — Telephone Encounter (Signed)
Moved the post op appt from October 9th to October 3rd. Patient given new date/time

## 2017-07-23 ENCOUNTER — Ambulatory Visit: Payer: BLUE CROSS/BLUE SHIELD | Attending: Gynecologic Oncology | Admitting: Gynecologic Oncology

## 2017-07-23 ENCOUNTER — Encounter: Payer: Self-pay | Admitting: Gynecologic Oncology

## 2017-07-23 ENCOUNTER — Other Ambulatory Visit: Payer: Self-pay | Admitting: Gynecologic Oncology

## 2017-07-23 VITALS — BP 138/86 | HR 82 | Temp 97.9°F | Resp 18 | Ht 63.25 in | Wt 141.7 lb

## 2017-07-23 DIAGNOSIS — Z90722 Acquired absence of ovaries, bilateral: Secondary | ICD-10-CM | POA: Diagnosis not present

## 2017-07-23 DIAGNOSIS — Z09 Encounter for follow-up examination after completed treatment for conditions other than malignant neoplasm: Secondary | ICD-10-CM | POA: Insufficient documentation

## 2017-07-23 DIAGNOSIS — N859 Noninflammatory disorder of uterus, unspecified: Secondary | ICD-10-CM | POA: Diagnosis not present

## 2017-07-23 DIAGNOSIS — Z90710 Acquired absence of both cervix and uterus: Secondary | ICD-10-CM | POA: Insufficient documentation

## 2017-07-23 DIAGNOSIS — F17210 Nicotine dependence, cigarettes, uncomplicated: Secondary | ICD-10-CM | POA: Diagnosis not present

## 2017-07-23 DIAGNOSIS — E079 Disorder of thyroid, unspecified: Secondary | ICD-10-CM | POA: Diagnosis not present

## 2017-07-23 DIAGNOSIS — I10 Essential (primary) hypertension: Secondary | ICD-10-CM | POA: Insufficient documentation

## 2017-07-23 DIAGNOSIS — N858 Other specified noninflammatory disorders of uterus: Secondary | ICD-10-CM

## 2017-07-23 MED ORDER — ESTRADIOL 1 MG PO TABS: 1.0000 mg | ORAL_TABLET | Freq: Every day | ORAL | 12 refills | Status: DC

## 2017-07-23 NOTE — Patient Instructions (Addendum)
Follow up with your OB/GYN physician to manage the estrogen replacement. No follow up needed with Dr. Alycia Rossetti.  Estradiol tablets What is this medicine? ESTRADIOL (es tra DYE ole) is an estrogen. It is mostly used as hormone replacement in menopausal women. It helps to treat hot flashes and prevent osteoporosis. It is also used to treat women with low estrogen levels or those who have had their ovaries removed. This medicine may be used for other purposes; ask your health care provider or pharmacist if you have questions. COMMON BRAND NAME(S): Estrace, Femtrace, Gynodiol What should I tell my health care provider before I take this medicine? They need to know if you have or ever had any of these conditions: -abnormal vaginal bleeding -blood vessel disease or blood clots -breast, cervical, endometrial, ovarian, liver, or uterine cancer -dementia -diabetes -gallbladder disease -heart disease or recent heart attack -high blood pressure -high cholesterol -high level of calcium in the blood -hysterectomy -kidney disease -liver disease -migraine headaches -protein C deficiency -protein S deficiency -stroke -systemic lupus erythematosus (SLE) -tobacco smoker -an unusual or allergic reaction to estrogens, other hormones, medicines, foods, dyes, or preservatives -pregnant or trying to get pregnant -breast-feeding How should I use this medicine? Take this medicine by mouth. To reduce nausea, this medicine may be taken with food. Follow the directions on the prescription label. Take this medicine at the same time each day and in the order directed on the package. Do not take your medicine more often than directed. Contact your pediatrician regarding the use of this medicine in children. Special care may be needed. A patient package insert for the product will be given with each prescription and refill. Read this sheet carefully each time. The sheet may change frequently. Overdosage: If you  think you have taken too much of this medicine contact a poison control center or emergency room at once. NOTE: This medicine is only for you. Do not share this medicine with others. What if I miss a dose? If you miss a dose, take it as soon as you can. If it is almost time for your next dose, take only that dose. Do not take double or extra doses. What may interact with this medicine? Do not take this medicine with any of the following medications: -aromatase inhibitors like aminoglutethimide, anastrozole, exemestane, letrozole, testolactone This medicine may also interact with the following medications: -carbamazepine -certain antibiotics used to treat infections -certain barbiturates or benzodiazepines used for inducing sleep or treating seizures -grapefruit juice -medicines for fungus infections like itraconazole and ketoconazole -raloxifene or tamoxifen -rifabutin, rifampin, or rifapentine -ritonavir -St. John's Wort -warfarin This list may not describe all possible interactions. Give your health care provider a list of all the medicines, herbs, non-prescription drugs, or dietary supplements you use. Also tell them if you smoke, drink alcohol, or use illegal drugs. Some items may interact with your medicine. What should I watch for while using this medicine? Visit your doctor or health care professional for regular checks on your progress. You will need a regular breast and pelvic exam and Pap smear while on this medicine. You should also discuss the need for regular mammograms with your health care professional, and follow his or her guidelines for these tests. This medicine can make your body retain fluid, making your fingers, hands, or ankles swell. Your blood pressure can go up. Contact your doctor or health care professional if you feel you are retaining fluid. If you have any reason to think you are pregnant,  stop taking this medicine right away and contact your doctor or health care  professional. Smoking increases the risk of getting a blood clot or having a stroke while you are taking this medicine, especially if you are more than 54 years old. You are strongly advised not to smoke. If you wear contact lenses and notice visual changes, or if the lenses begin to feel uncomfortable, consult your eye doctor or health care professional. This medicine can increase the risk of developing a condition (endometrial hyperplasia) that may lead to cancer of the lining of the uterus. Taking progestins, another hormone drug, with this medicine lowers the risk of developing this condition. Therefore, if your uterus has not been removed (by a hysterectomy), your doctor may prescribe a progestin for you to take together with your estrogen. You should know, however, that taking estrogens with progestins may have additional health risks. You should discuss the use of estrogens and progestins with your health care professional to determine the benefits and risks for you. If you are going to have surgery, you may need to stop taking this medicine. Consult your health care professional for advice before you schedule the surgery. What side effects may I notice from receiving this medicine? Side effects that you should report to your doctor or health care professional as soon as possible: -allergic reactions like skin rash, itching or hives, swelling of the face, lips, or tongue -breast tissue changes or discharge -changes in vision -chest pain -confusion, trouble speaking or understanding -dark urine -general ill feeling or flu-like symptoms -light-colored stools -nausea, vomiting -pain, swelling, warmth in the leg -right upper belly pain -severe headaches -shortness of breath -sudden numbness or weakness of the face, arm or leg -trouble walking, dizziness, loss of balance or coordination -unusual vaginal bleeding -yellowing of the eyes or skin Side effects that usually do not require medical  attention (report to your doctor or health care professional if they continue or are bothersome): -hair loss -increased hunger or thirst -increased urination -symptoms of vaginal infection like itching, irritation or unusual discharge -unusually weak or tired This list may not describe all possible side effects. Call your doctor for medical advice about side effects. You may report side effects to FDA at 1-800-FDA-1088. Where should I keep my medicine? Keep out of the reach of children. Store at room temperature between 20 and 25 degrees C (68 and 77 degrees F). Keep container tightly closed. Protect from light. Throw away any unused medicine after the expiration date. NOTE: This sheet is a summary. It may not cover all possible information. If you have questions about this medicine, talk to your doctor, pharmacist, or health care provider.  2018 Elsevier/Gold Standard (2011-01-09 26:33:35)

## 2017-07-23 NOTE — Progress Notes (Signed)
Patient Consult Note: Gyn-Onc Return Visit  Ariel [name] 56 y.o. female  CC:  Chief Complaint  Patient presents with  . Uterine mass N85.9]  . Routine Post Op    HPI: Patient was seen today in consultation at the request of Dr. Dellis Filbert.  Patient is a very pleasant 56 year old gravida 2 para 2 who thought that she was gaining weight over the past year but felt that was upper portion, she was eating. She was without insurance her. Time and did not seek medical care or attention. Ultimately went she received insurance she went to get her goiter checked. She had previously been on thyroid medication but was off of it when she lost her insurance. She was doing home iodine therapy which helped with her TSH but it did not help the size of the goiter. During that process her primary care physician Dr. Colin Benton performed and abdominal examination mass was noted. She will return to CT scan that revealed:  CT scan 05/08/2017:   IMPRESSION: 1. Markedly enlarged uterus. Along the superior anterior aspect of the uterus, there is a mixed attenuation mass measuring 12.2 x 13.8x 9.5 cm. There is a more uniformly solid mass in the mid uterus measuring 5.4 x 6.3 x 6.8 cm. These lesions most likely represent leiomyomas, although the more complex superior mass could have a degree of sarcomatous degeneration. No other pelvic mass evident.  2. Occasional sigmoid diverticula without diverticulitis. No bowel wall thickening or bowel obstruction. No abscess. Appendix appears normal.  3.  No renal or ureteral calculus.  No hydronephrosis.  4.  Aortoiliac atherosclerosis.  5.  Small ventral hernia containing only fat.  6.  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  Large solid mass arising from  the fundus of the Uterus measuring 16 x 10 x 15.5 cm with areas of positive CFD, arterial blood flow doppler.  Right Ovary not seen.  Left Ovary normal.  No FF in CDS.   ENCOUNTER DATE: 06/26/17 Surgery: Total abdominal hysterectomy, bilateral salpingo-oophorectomy  Pathology: Uterus, cervix, bilateral tubes and ovaries  Operative findings: 20cm fibroid uterus, benign fibroid on frozen section. Normal appearing ovaries. 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 UNREMARKABLE BILATERAL OVARIES AND FALLOPIAN TUBES  She comes in today for her post op check. She is overall doing quite well. She does complain of some occasional incisional pain and twinges. She is taking Tylenol during the day and occasionally will take Percocet at night. Occasional she'll experience some right-sided sharp pain. She has required magnesium citrate on a few occasions to have bowel movements. She has not been taking a stool softener. She denies any vaginal bleeding. She does endorse a few spots of blood on her undergarments at the waist band and she thinks is related to her incision. She does have hot flashes and the need to use a fan. She states that the vasomotor symptoms are bothersome to her. She is looking forward to getting back to the gym and working out.  Current Meds:  Outpatient Encounter Prescriptions as of 07/23/2017  Medication Sig  . acetaminophen (TYLENOL) 500 MG tablet Take 500-1,000 mg by mouth every 6 (six)  hours as needed.  . hydrochlorothiazide (HYDRODIURIL) 25 MG tablet Take 1 tablet (25 mg total) by mouth daily.  . nicotine ([employer] NICOTINE TRANSDERMAL SYS) 14 mg/24hr patch Place 1 patch (14 mg total) onto the skin daily.  Marland Kitchen oxyCODONE-acetaminophen (PERCOCET/ROXICET) 5-325 MG tablet Take 1-2 tablets by mouth every 6 (six) hours as needed for severe pain.  .  magnesium citrate solution Take 296 mLs by mouth as needed.  . [DISCONTINUED] nicotine ([employer] NICOTINE) 7 mg/24hr patch Place 1 patch (7 mg total) onto the skin daily.   No facility-administered encounter medications on file as of 07/23/2017.     Allergy: No Known Allergies  Social Hx:   Social History   Social History  . Marital status: Single    Spouse name: N/A  . Number of children: N/A  . Years of education: N/A   Occupational History  . Not on file.   Social History Main Topics  . Smoking status: Current Every Day Smoker    Types: Cigarettes  . Smokeless tobacco: Never Used     Comment: 4 CIGS A DAY  . Alcohol use Yes     Comment: OCC  . Drug use: No  . Sexual activity: Yes    Partners: Male     Comment: 1ST INTERCOURSE- 55, PARTNERS- 22, CURRENT PARTNER- 3.5 YRS    Other Topics Concern  . 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 Christian, no church currenty      Lifestyle: "poor diet"; no exercise       Past Surgical Hx:  Past Surgical History:  Procedure Laterality Date  . CARDIAC ELECTROPHYSIOLOGY STUDY AND ABLATION  2010   hx tachycardia   . LAPAROTOMY N/A 06/26/2017   Procedure: EXPLORATORY LAPAROTOMY;  Surgeon: Everitt Amber, MD;  Location: [facility];  Service: Gynecology;  Laterality: N/A;  . TONSILLECTOMY  1967  . TUBAL LIGATION     X2, REVERSAL     Past Medical Hx:  Past Medical History:  Diagnosis Date  . Chronic bronchitis ([place])   . Goiter   . History of hypertension   . Hyperlipidemia   . Thyroid disease     Oncology Hx:   No history exists.    Family Hx:  Family History  Problem Relation Age of Onset  . Thyroid disease Maternal Aunt   . Hypertension Mother   . Renal Disease Mother   . Diabetes Father   . Hypertension Father   . Renal Disease Father   . Stroke Maternal Grandmother   . Cancer Paternal Grandmother        ?     Vitals:  Blood pressure 138/86, pulse 82, temperature 97.9 F (36.6 C), temperature source Oral, resp. rate 18, height 5' 3.25" (1.607 m), weight 141 lb 11.2 oz (64.3 kg), last menstrual period 05/30/2015, SpO2 100 %.  Physical Exam: Well-nourished well-developed female in no acute distress.  Abdomen: Well-healing vertical midline incision with small proximal with 4 mm opening at the very bottom in the incision with the skin of her hands. There is no purulent discharge there is no warmth. There is no erythema. The remainder the incision is well-healed. Her abdomen is still slightly tender. There is no evidence of any incisional hernias.  Extremities: No edema.  Pelvic: Actual genitalia within normal limits. The vaginal cuff is visualized. There are no  visible lesions. There is no discharge there is no bleeding. Bimanual examination reveals normal postoperative changes. There is no fluctuance or tenderness.  Assessment/Plan: 56 year old with an abdominal pelvic mass that preoperatively was felt to be arising from the uterus. She underwent definitive surgery with a total abdominal hysterectomy bilateral salpingo-oophorectomy. Fortunately pathology was completely benign consistent with fibroids. She is doing very well postoperatively. She is having vasomotor symptoms and is symptomatic and would be interested in hormone replacement therapy. After discussion. Her prescription her for estradiol was sent to her pharmacy. She knows that she can work on weaning this down with Dr. Dellis Filbert over time.  She was asked to refrain from intercourse and strenuous exercise for an additional 2 weeks but that she can return to her usual activities.  We appreciate the opportunity to partner in the care of this very pleasant patient. Her questions today were elicited in answer to her satisfaction.  Taqwa Deem A., MD 07/23/2017, 9:34 AM

## 2017-07-29 ENCOUNTER — Ambulatory Visit: Payer: BLUE CROSS/BLUE SHIELD | Admitting: Gynecologic Oncology

## 2017-08-31 ENCOUNTER — Encounter: Payer: Self-pay | Admitting: Family Medicine

## 2017-09-15 ENCOUNTER — Encounter: Payer: BLUE CROSS/BLUE SHIELD | Admitting: Family Medicine

## 2017-09-23 ENCOUNTER — Encounter: Payer: BLUE CROSS/BLUE SHIELD | Admitting: Family Medicine

## 2017-10-06 ENCOUNTER — Encounter: Payer: Self-pay | Admitting: Obstetrics & Gynecology

## 2017-10-07 ENCOUNTER — Encounter: Payer: Self-pay | Admitting: Obstetrics & Gynecology

## 2017-10-30 ENCOUNTER — Encounter: Payer: Self-pay | Admitting: Family Medicine

## 2017-11-29 ENCOUNTER — Other Ambulatory Visit: Payer: Self-pay | Admitting: Obstetrics & Gynecology

## 2017-12-18 ENCOUNTER — Ambulatory Visit: Payer: BLUE CROSS/BLUE SHIELD | Admitting: Endocrinology

## 2018-01-02 ENCOUNTER — Encounter: Payer: Self-pay | Admitting: Obstetrics & Gynecology

## 2018-01-06 ENCOUNTER — Ambulatory Visit: Payer: BLUE CROSS/BLUE SHIELD | Admitting: Endocrinology

## 2018-01-11 IMAGING — US US SOFT TISSUE HEAD/NECK
1 series · 13 of 25 positions shown · non-contrast
Comparison: None.

CLINICAL DATA: Goiter.

EXAM:
THYROID ULTRASOUND
TECHNIQUE: Ultrasound examination of the thyroid gland and adjacent soft
tissues was performed.

[Series 1: us soft tissue head/neck · 0.11mm/px · 13 of 42 slices shown]
[im 1/42]
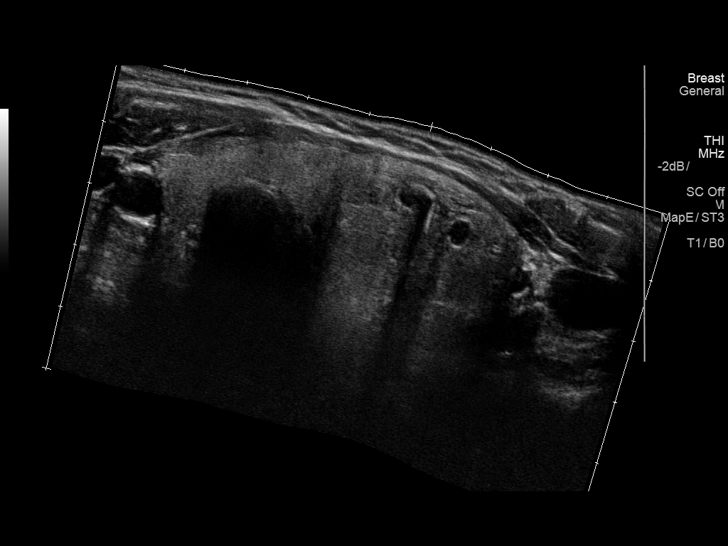
[im 4/42]
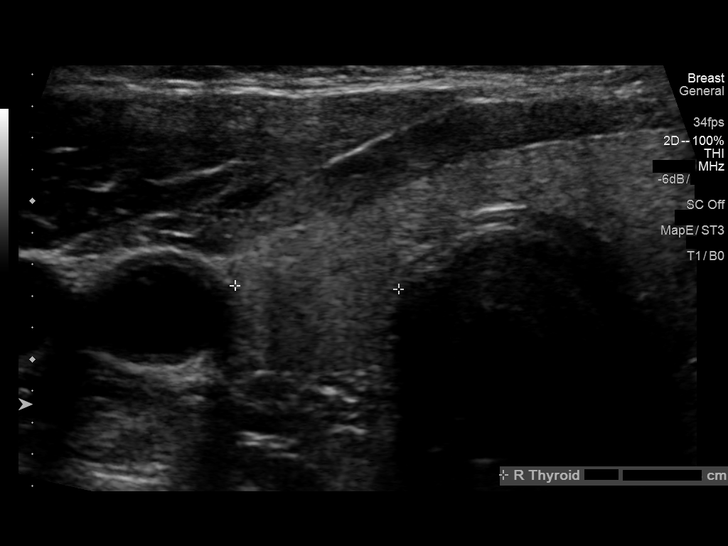
[im 7/42]
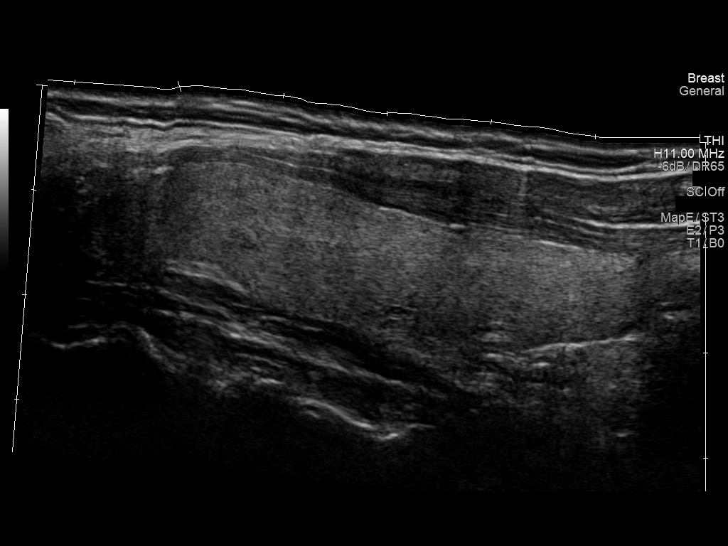
[im 11/42]
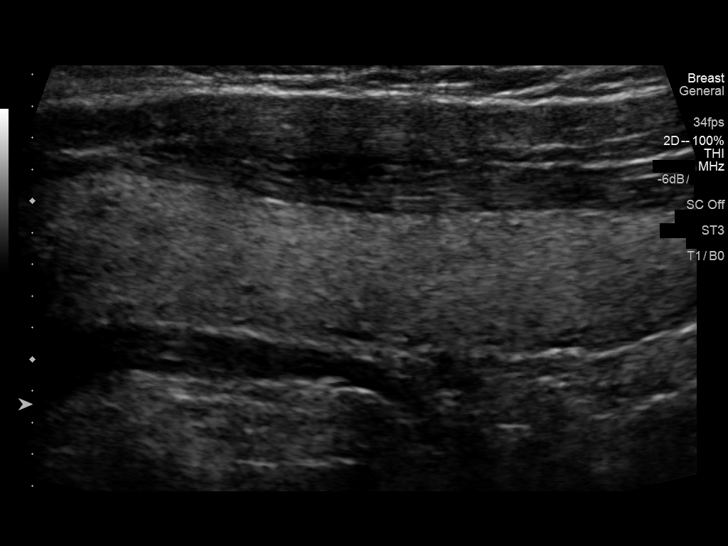
[im 14/42]
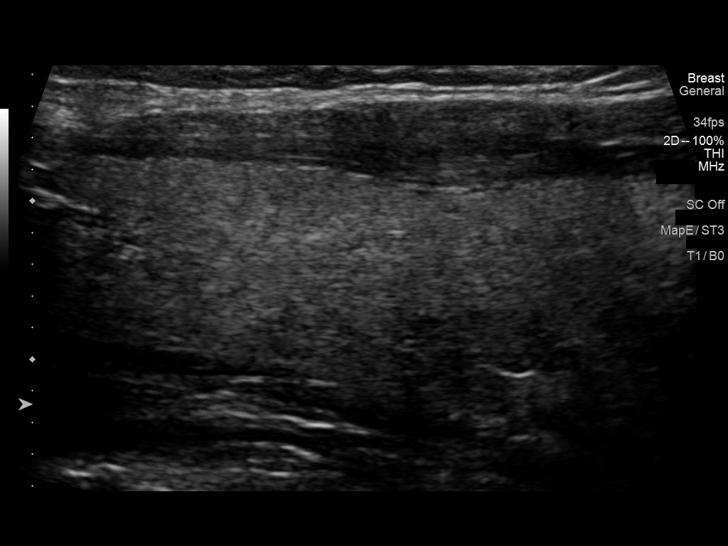
[im 18/42]
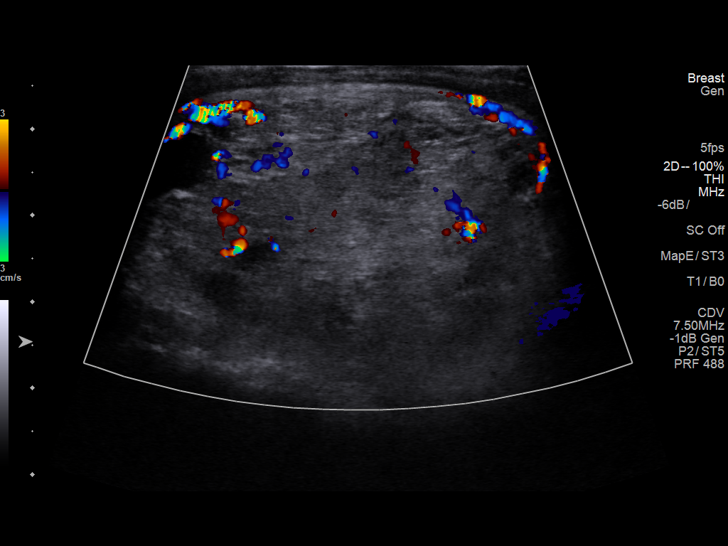
[im 21/42]
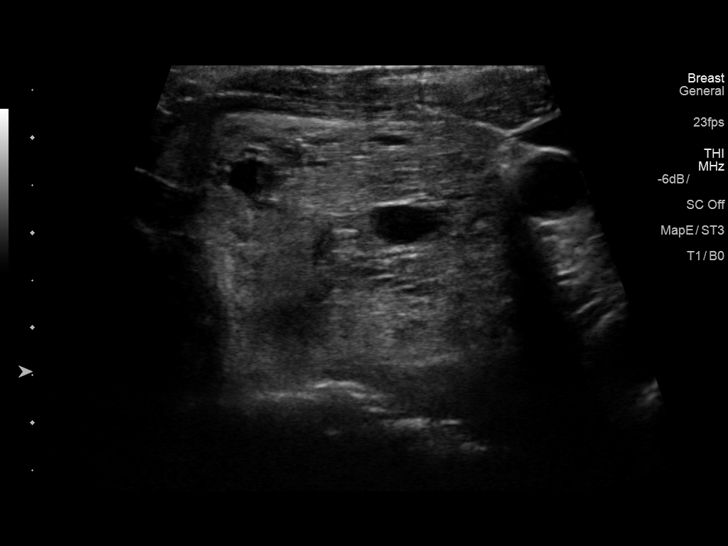
[im 24/42]
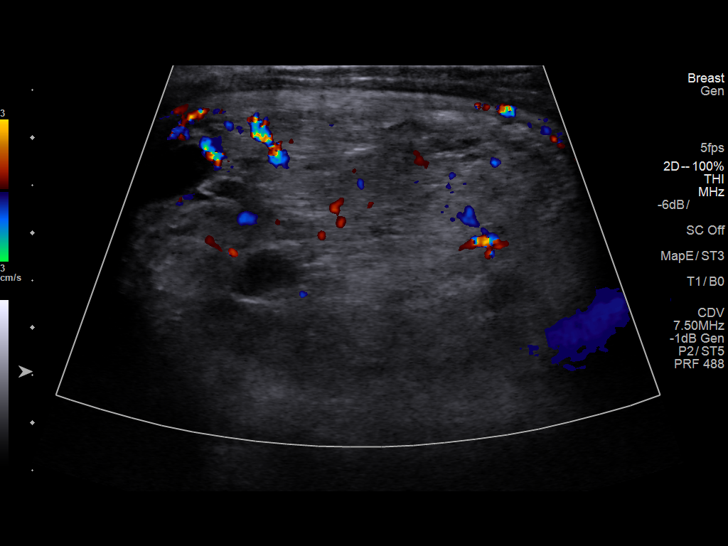
[im 28/42]
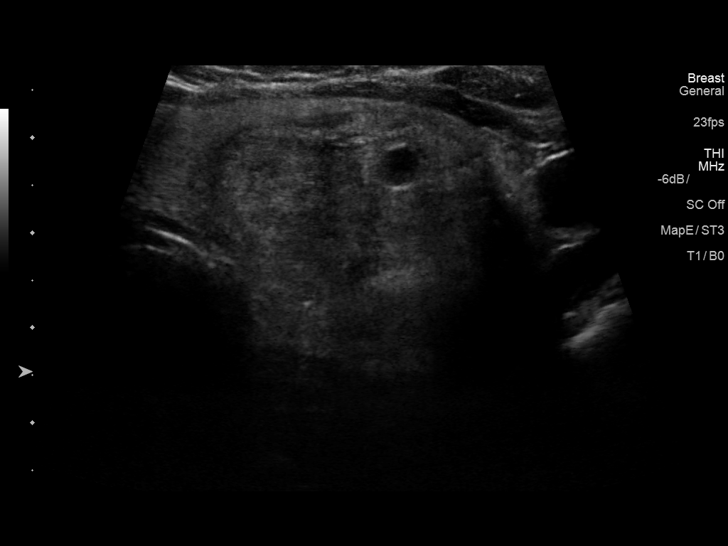
[im 31/42]
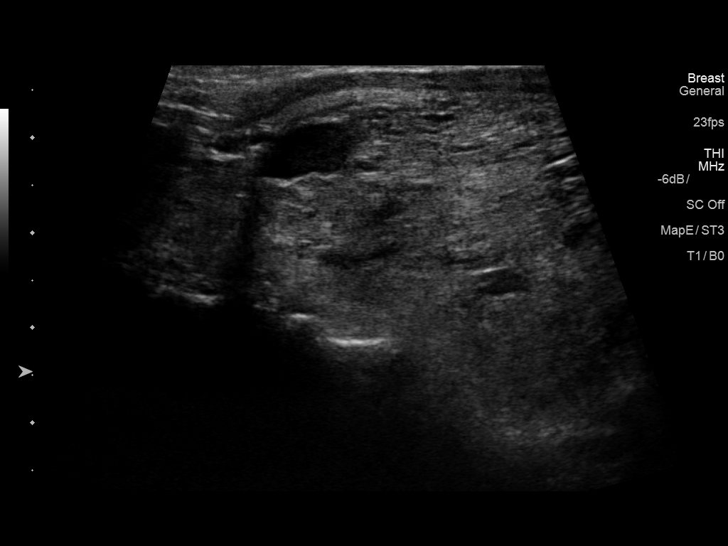
[im 35/42]
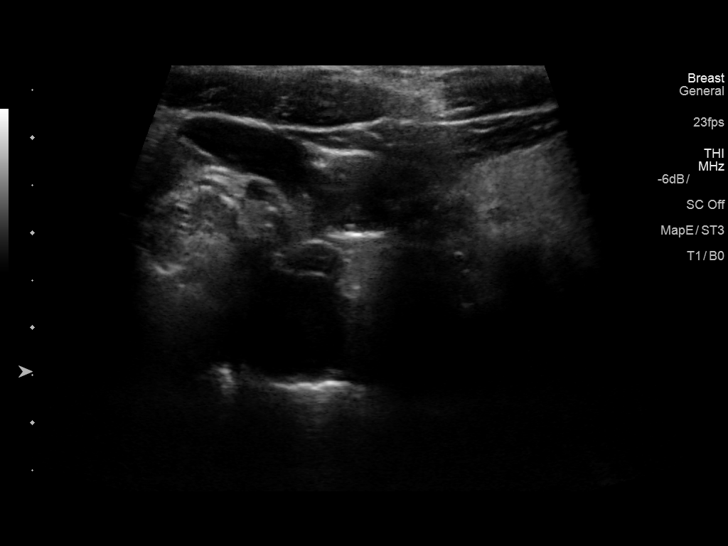
[im 38/42]
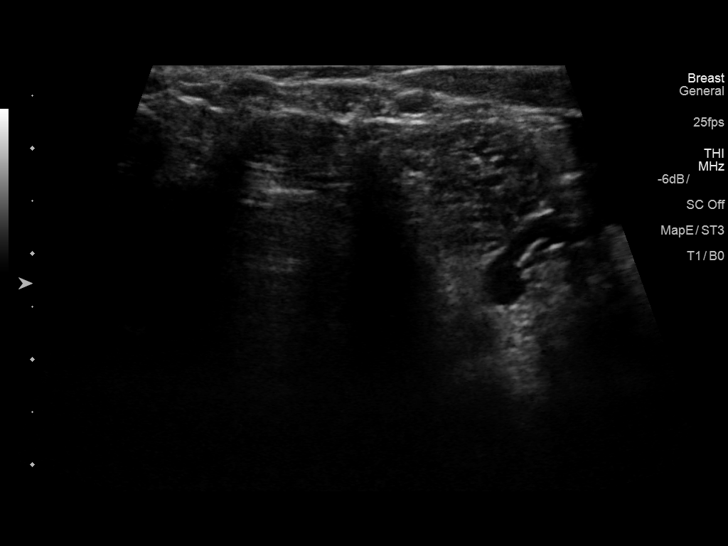
[im 42/42]
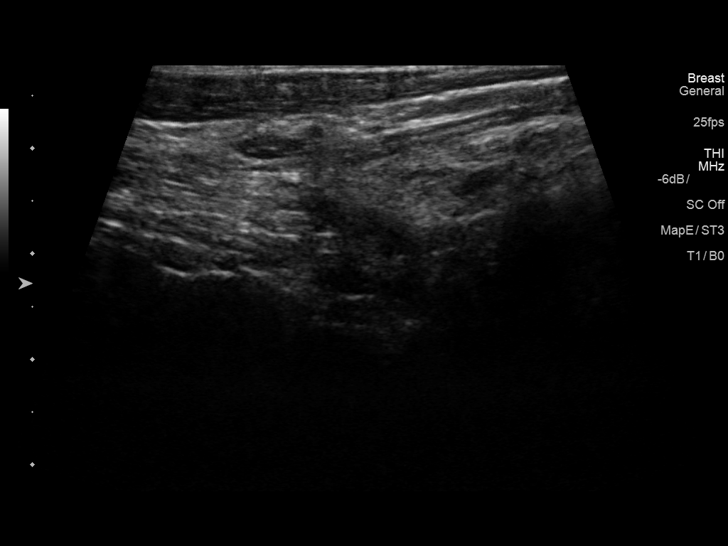

[13 of 25 positions shown; findings below may reference images not displayed]

FINDINGS: Parenchymal Echotexture: Normal

Isthmus: Normal in size measuring 0.5 cm in diameter

Right lobe: Normal in size measuring 5.1 x 1.2 x 1.0 cm

Left lobe: Enlarged measuring 5.6 x 3.5 x 4.0 cm

_________________________________________________________

Estimated total number of nodules >/= 1 cm: 1

Number of spongiform nodules >/=  2 cm not described below (TR1): 0

Number of mixed cystic and solid nodules >/= 1.5 cm not described
below (TR2): 0

_________________________________________________________

Nodule # 1:

Location: Left; Inferior

Maximum size: 4.8 cm; Other 2 dimensions: 3.8 x 3.6 cm

Composition: solid/almost completely solid (2)

Echogenicity: isoechoic (1)

Shape: not taller-than-wide (0)

Margins: ill-defined (0)

Echogenic foci: none (0)

ACR TI-RADS total points: 3.

ACR TI-RADS risk category: TR3 (3 points).

ACR TI-RADS recommendations:

**Given size (>/= 2.5 cm) and appearance, fine needle aspiration of
this mildly suspicious nodule should be considered based on TI-RADS
criteria.

_________________________________________________________
IMPRESSION: Solitary approximately 4.8 cm nodule / mass nearly replacing the mid
and inferior aspect the left lobe of the thyroid meets imaging
criteria to recommend percutaneous sampling as clinically indicated.

The above is in keeping with the ACR TI-RADS recommendations - [HOSPITAL] 6075;[DATE].

## 2018-01-19 ENCOUNTER — Encounter: Payer: Self-pay | Admitting: Endocrinology

## 2018-01-19 ENCOUNTER — Ambulatory Visit: Payer: BLUE CROSS/BLUE SHIELD | Admitting: Endocrinology

## 2018-01-19 VITALS — BP 132/82 | HR 84 | Wt 152.4 lb

## 2018-01-19 DIAGNOSIS — M8580 Other specified disorders of bone density and structure, unspecified site: Secondary | ICD-10-CM

## 2018-01-19 DIAGNOSIS — E049 Nontoxic goiter, unspecified: Secondary | ICD-10-CM | POA: Diagnosis not present

## 2018-01-19 HISTORY — DX: Other specified disorders of bone density and structure, unspecified site: M85.80

## 2018-01-19 NOTE — Patient Instructions (Addendum)
Let's recheck the ultrasound.  you will receive a phone call, about a day and time for an appointment. Please see Dr Maudie Mercury as scheduled, for your annual checkup.   If there is not much change, please come back for a follow-up appointment in 1-2 years.

## 2018-01-19 NOTE — Progress Notes (Signed)
Subjective:    Patient ID: Ariel [name], female    DOB: 06-18-1961, 57 y.o.   MRN: [id number]  HPI Pt return for f/u of multinodular goiter (dx'ed 2007, when pt lived in [place].  she has no h/o XRT or surgery to the neck; she was rx'ed synthroid, but has not recently taken; bx in 2018 showed BENIGN FOLLICULAR NODULE (BETHESDA CATEGORY II); f/u US in 2018 showed solitary approximately 4.8 cm nodule / mass nearly replacing the mid and inferior aspect the left lobe).  pt says there has been no change in the size of the thyroid mass.  She has difficulty with concentration.   Past Medical History:  Diagnosis Date  . Chronic bronchitis ([place])   . Goiter   . History of hypertension   . Hyperlipidemia   . Thyroid disease     Past Surgical History:  Procedure Laterality Date  . CARDIAC ELECTROPHYSIOLOGY STUDY AND ABLATION  2010   hx tachycardia   . LAPAROTOMY N/A 06/26/2017   Procedure: EXPLORATORY LAPAROTOMY;  Surgeon: Everitt Amber, MD;  Location: [facility];  Service: Gynecology;  Laterality: N/A;  . TONSILLECTOMY  1967  . TUBAL LIGATION     X2, REVERSAL     Social History   Socioeconomic History  . Marital status: Single    Spouse name: Not on file  . Number of children: Not on file  . Years of education: Not on file  . Highest education level: Not on file  Occupational History  . Not on file  Social Needs  . Financial resource strain: Not on file  . Food insecurity:    Worry: Not on file    Inability: Not on file  . Transportation needs:    Medical: Not on file    Non-medical: Not on file  Tobacco Use  . Smoking status: Current Every Day Smoker    Types: Cigarettes  . Smokeless tobacco: Never Used  . Tobacco comment: 4 CIGS A DAY  Substance and Sexual Activity  . Alcohol use: Yes    Comment: OCC  . Drug use: No  . Sexual activity: Yes    Partners: Male    Comment: 1ST INTERCOURSE- 6, PARTNERS- 22, CURRENT PARTNER- 3.5 YRS   Lifestyle  . Physical activity:   Days per week: Not on file    Minutes per session: Not on file  . Stress: Not on file  Relationships  . Social connections:    Talks on phone: Not on file    Gets together: Not on file    Attends religious service: Not on file    Active member of club or organization: Not on file    Attends meetings of clubs or organizations: Not on file    Relationship status: Not on file  . Intimate partner violence:    Fear of current or ex partner: Not on file    Emotionally abused: Not on file    Physically abused: Not on file    Forced sexual activity: Not on file  Other Topics Concern  . 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 Christian, no church currenty      Lifestyle: "poor diet"; no exercise    Current Outpatient Medications on File Prior to Visit  Medication Sig Dispense Refill  . hydrochlorothiazide (HYDRODIURIL) 25 MG tablet Take 1 tablet (25 mg  total) by mouth daily. 90 tablet 3  . magnesium citrate solution Take 296 mLs by mouth as needed.    Marland Kitchen acetaminophen (TYLENOL) 500 MG tablet Take 500-1,000 mg by mouth every 6 (six) hours as needed.    Marland Kitchen estradiol (ESTRACE) 1 MG tablet Take 1 tablet (1 mg total) by mouth daily. (Patient not taking: Reported on 01/19/2018) 30 tablet 12  . nicotine ([employer] NICOTINE TRANSDERMAL SYS) 14 mg/24hr patch Place 1 patch (14 mg total) onto the skin daily. (Patient not taking: Reported on 01/19/2018) 56 patch 0  . oxyCODONE-acetaminophen (PERCOCET/ROXICET) 5-325 MG tablet Take 1-2 tablets by mouth every 6 (six) hours as needed for severe pain. (Patient not taking: Reported on 01/19/2018) 30 tablet 0   No current facility-administered medications on file prior to visit.     No Known Allergies  Family History  Problem Relation Age of Onset  . Thyroid disease Maternal Aunt   . Hypertension Mother   . Renal Disease Mother   . Diabetes Father   .  Hypertension Father   . Renal Disease Father   . Stroke Maternal Grandmother   . Cancer Paternal Grandmother        ?    BP 132/82 (BP Location: Left Arm, Patient Position: Sitting, Cuff Size: Normal)   Pulse 84   Wt 152 lb 6.4 oz (69.1 kg)   LMP 05/30/2015 (Approximate)   SpO2 99%   BMI 26.78 kg/m   Review of Systems No weight change    Objective:   Physical Exam VS: see vs page GEN: no distress HEAD: head: no deformity eyes: no periorbital swelling, no proptosis external nose and ears are normal mouth: no lesion seen NECK: thyroid is 5-10 times normal size (L>R).       Assessment & Plan:  Multinodular goiter, due for recheck.    Patient Instructions  Let's recheck the ultrasound.  you will receive a phone call, about a day and time for an appointment. Please see Dr Maudie Mercury as scheduled, for your annual checkup.   If there is not much change, please come back for a follow-up appointment in 1-2 years.

## 2018-01-21 NOTE — Progress Notes (Signed)
HPI:  Using dictation device. Unfortunately this device frequently misinterprets words/phrases.  Here for CPE: Due for labs, mammo?, colon ca screening, ? Hep c screen, ? hiv screen  -Concerns and/or follow up today:  Chronic medical problems summarized below were reviewed for changes. Not seen in quite some time.  Reports she did not do the Cologuard test because it was sent to the wrong piece.  She was moving.  She wishes for [place] to reorder this. She is doing well in terms of recovery from her surgery from fibroids.  However, she did start smoking again.  See below. She has a new concern of some tingling in the first 3 fingers of the right hand primarily, sometimes the left when she first wakes up in the morning.  She uses her hands a lot.  She wonders if this is carpal tunnel.  She feels like there is poor circulation. She wants to check a urine test.  Reports she has had some mild dysuria, pressure with urination for a few days.  Denies fevers, flank pain, hematuria. She reports she will be seeing her gynecologist soon for her breast exam and mammogram.  She HTN: -meds: hctz  Tobacco use: -counseled, offered help 8/18 - she wanted to try nicotine patches -tried, quit, then restarted smoking -wants to quit because does not like the smell or the mess -currently smoking 1/4-1/2 ppd -denies cough, wt loss, sob  Thyroid goiter: -sees Dr. Loanne Drilling in endo for management -reports he requested I do TSH today and forward to him to save her a stick  Hx uterine mass, s/p complete hysterectomy, oophorectomy in 2018 - benign  -Diet: variety of foods, balance and well rounded, larger portion sizes -Exercise: no regular exercise -Taking folic acid, vitamin D or calcium: no -Diabetes and Dyslipidemia Screening: fasting for labs -Vaccines: see vaccine section EPIC -pap history: s/p complete hysterectomy for fibroid mass -FDLMP: n/a -wants STI testing (Hep C if born [birth year]): wants to do hep  c and hiv screening we offered -FH breast, colon or ovarian ca: see FH Last mammogram: sees gyn - reports does this with them - seeing them next week Last colon cancer screening: advised last visit - she did not do; discussed options Breast Ca Risk Assessment: see family history and pt history DEXA (>/= 65): n/a  -Alcohol, Tobacco, drug use: see social history  Review of Systems - no reported fevers, unintentional weight loss, vision loss, hearing loss, chest pain, sob, hemoptysis, melena, hematochezia, hematuria, genital discharge, changing or concerning skin lesions, bleeding, bruising, loc, thoughts of self harm or SI  Past Medical History:  Diagnosis Date  . Chronic bronchitis ([place])   . Fibroids, intramural   . Goiter   . History of hypertension   . Hyperlipidemia   . Thyroid disease     Past Surgical History:  Procedure Laterality Date  . ABDOMINAL HYSTERECTOMY     07/2017-Dr [place] per patient  . CARDIAC ELECTROPHYSIOLOGY STUDY AND ABLATION  2010   hx tachycardia   . LAPAROTOMY N/A 06/26/2017   Procedure: EXPLORATORY LAPAROTOMY;  Surgeon: Everitt Amber, MD;  Location: [facility];  Service: Gynecology;  Laterality: N/A;  . TONSILLECTOMY  1967  . TUBAL LIGATION     X2, REVERSAL     Family History  Problem Relation Age of Onset  . Thyroid disease Maternal Aunt   . Hypertension Mother   . Renal Disease Mother   . Diabetes Father   . Hypertension Father   . Renal  Disease Father   . Stroke Maternal Grandmother   . Cancer Paternal Grandmother        ?    Social History   Socioeconomic History  . Marital status: Single    Spouse name: Not on file  . Number of children: Not on file  . Years of education: Not on file  . Highest education level: Not on file  Occupational History  . Not on file  Social Needs  . Financial resource strain: Not on file  . Food insecurity:    Worry: Not on file    Inability: Not on file  . Transportation needs:    Medical: Not  on file    Non-medical: Not on file  Tobacco Use  . Smoking status: Current Every Day Smoker    Types: Cigarettes  . Smokeless tobacco: Never Used  . Tobacco comment: 4 CIGS A DAY  Substance and Sexual Activity  . Alcohol use: Yes    Comment: OCC  . Drug use: No  . Sexual activity: Yes    Partners: Male    Comment: 1ST INTERCOURSE- 74, PARTNERS- 22, CURRENT PARTNER- 3.5 YRS   Lifestyle  . Physical activity:    Days per week: Not on file    Minutes per session: Not on file  . Stress: Not on file  Relationships  . Social connections:    Talks on phone: Not on file    Gets together: Not on file    Attends religious service: Not on file    Active member of club or organization: Not on file    Attends meetings of clubs or organizations: Not on file    Relationship status: Not on file  Other Topics Concern  . Not on file     Current Outpatient Medications:  .  hydrochlorothiazide (HYDRODIURIL) 25 MG tablet, Take 1 tablet (25 mg total) by mouth daily., Disp: 90 tablet, Rfl: 3 .  magnesium citrate solution, Take 296 mLs by mouth as needed., Disp: , Rfl:  .  Multiple Vitamin (MULTIVITAMIN) capsule, Take 1 capsule by mouth daily., Disp: , Rfl:   EXAM:  Vitals:   01/22/18 0858  BP: (!) 100/58  Pulse: 93  Temp: 98.5 F (36.9 C)    GENERAL: vitals reviewed and listed below, alert, oriented, appears well hydrated and in no acute distress  HEENT: head atraumatic, PERRLA, normal appearance of eyes, ears, nose and mouth. moist mucus membranes.  NECK: supple, + goiter  LUNGS: clear to auscultation bilaterally, no rales, rhonchi or wheeze  CV: HRRR, no peripheral edema or cyanosis, normal pedal pulses  ABDOMEN: bowel sounds normal, soft, non tender to palpation, no masses, no rebound or guarding  GU/BREAST: declined, reports will be seeing gyn  SKIN: no rash or abnormal lesions  MS: normal gait, moves all extremities normally  NEURO: normal gait, speech and thought  processing grossly intact, muscle tone grossly intact throughout  PSYCH: normal affect, pleasant and cooperative  ASSESSMENT AND PLAN:  Discussed the following assessment and plan:  PREVENTIVE EXAM: -Discussed and advised all [place] preventive services health task force level A and B recommendations for age, sex and risks. -Advised at least 150 minutes of exercise per week and a healthy diet with avoidance of (less then 1 serving per week) processed foods, white starches, red meat, fast foods and sweets and consisting of: * 5-9 servings of fresh fruits and vegetables (not corn or potatoes) *nuts and seeds, beans *olives and olive oil *lean meats such as  fish and white chicken  *whole grains -labs, studies and vaccines per orders this encounter  Screening for depression -negative  Tobacco use -counseled about 5 minutes, offered help, quit line info given, plan per pt instructions  Aortic atherosclerosis ([place]) -risk reduction advised, healthy med diet, regular exercise, tobacco cessation, management chol, htn, sugar, etc  Hyperlipidemia, unspecified hyperlipidemia type - Plan: Lipid panel -consider statin  Hypertension, unspecified type - Plan: Basic metabolic panel, CBC  Hyperglycemia - Plan: Hemoglobin A1c  Goiter - sees endo, order per endo for TSH in - advised assistant to tell lab to draw for endo  Paresthesia - Plan: Vitamin B12 -suspect mild CTS - trial cock up wrist splints  Bilateral carpal tunnel syndrome -see above  Encounter for hepatitis C virus screening test for high risk patient - Plan: Hepatitis C antibody  Dysuria - Plan: POC Urinalysis Dipstick -return precutions  Patient advised to return to clinic immediately if symptoms worsen or persist or new concerns.  Patient Instructions  BEFORE YOU LEAVE: -urinalysis -order cologuard again -labs -? Flu vaccine if she wishes -follow up: 3 months  We ordered the Cologuard test for colon cancer screening.  Please complete this test promptly once the kit arrives. Please contact us if you have not received your kit in the next few weeks.  See your gynecologist as planned and get mammogram.  We will check the thyroid level and send to your endocrinologist as you requested.  Quit smoking. Have a plan in place (chew gum, walk, etc when you get cravings), call quit line, patches.  Cock up wrist splints for hands. Call if symptoms persist.  We have ordered labs or studies at this visit. It can take up to 1-2 weeks for results and processing. IF results require follow up or explanation, we will call you with instructions. Clinically stable results will be released to your [facility]. If you have not heard from [place] or cannot find your results in [facility] in 2 weeks please contact our office at [phone].  If you are not yet signed up for [facility], please consider signing up.   Preventive Care 40-64 Years, Female Preventive care refers to lifestyle choices and visits with your health care provider that can promote health and wellness. What does preventive care include?  A yearly physical exam. This is also called an annual well check.  Dental exams once or twice a year.  Routine eye exams. Ask your health care provider how often you should have your eyes checked.  Personal lifestyle choices, including: ? Daily care of your teeth and gums. ? Regular physical activity. ? Eating a healthy diet. ? Avoiding tobacco and drug use. ? Limiting alcohol use. ? Practicing safe sex. ? Taking low-dose aspirin daily starting at age 67. ? Taking vitamin and mineral supplements as recommended by your health care provider. What happens during an annual well check? The services and screenings done by your health care provider during your annual well check will depend on your age, overall health, lifestyle risk factors, and family history of disease. Counseling Your health care provider may ask you questions about  your:  Alcohol use.  Tobacco use.  Drug use.  Emotional well-being.  Home and relationship well-being.  Sexual activity.  Eating habits.  Work and work Statistician.  Method of birth control.  Menstrual cycle.  Pregnancy history.  Screening You may have the following tests or measurements:  Height, weight, and BMI.  Blood pressure.  Lipid and cholesterol levels. These may  be checked every 5 years, or more frequently if you are over 47 years old.  Skin check.  Lung cancer screening. You may have this screening every year starting at age 36 if you have a 30-pack-year history of smoking and currently smoke or have quit within the past 15 years.  Fecal occult blood test (FOBT) of the stool. You may have this test every year starting at age 50.  Flexible sigmoidoscopy or colonoscopy. You may have a sigmoidoscopy every 5 years or a colonoscopy every 10 years starting at age 63.  Hepatitis C blood test.  Hepatitis B blood test.  Sexually transmitted disease (STD) testing.  Diabetes screening. This is done by checking your blood sugar (glucose) after you have not eaten for a while (fasting). You may have this done every 1-3 years.  Mammogram. This may be done every 1-2 years. Talk to your health care provider about when you should start having regular mammograms. This may depend on whether you have a family history of breast cancer.  BRCA-related cancer screening. This may be done if you have a family history of breast, ovarian, tubal, or peritoneal cancers.  Pelvic exam and Pap test. This may be done every 3 years starting at age 73. Starting at age 43, this may be done every 5 years if you have a Pap test in combination with an HPV test.  Bone density scan. This is done to screen for osteoporosis. You may have this scan if you are at high risk for osteoporosis.  Discuss your test results, treatment options, and if necessary, the need for more tests with your health  care provider. Vaccines Your health care provider may recommend certain vaccines, such as:  Influenza vaccine. This is recommended every year.  Tetanus, diphtheria, and acellular pertussis (Tdap, Td) vaccine. You may need a Td booster every 10 years.  Varicella vaccine. You may need this if you have not been vaccinated.  Zoster vaccine. You may need this after age 68.  Measles, mumps, and rubella (MMR) vaccine. You may need at least one dose of MMR if you were born in 1957 or later. You may also need a second dose.  Pneumococcal 13-valent conjugate (PCV13) vaccine. You may need this if you have certain conditions and were not previously vaccinated.  Pneumococcal polysaccharide (PPSV23) vaccine. You may need one or two doses if you smoke cigarettes or if you have certain conditions.  Meningococcal vaccine. You may need this if you have certain conditions.  Hepatitis A vaccine. You may need this if you have certain conditions or if you travel or work in places where you may be exposed to hepatitis A.  Hepatitis B vaccine. You may need this if you have certain conditions or if you travel or work in places where you may be exposed to hepatitis B.  Haemophilus influenzae type b (Hib) vaccine. You may need this if you have certain conditions.  Talk to your health care provider about which screenings and vaccines you need and how often you need them. This information is not intended to replace advice given to you by your health care provider. Make sure you discuss any questions you have with your health care provider. Document Released: 11/03/2015 Document Revised: 06/26/2016 Document Reviewed: 08/08/2015 Elsevier Interactive Patient Education  2018 [employer].         No follow-ups on file.  Lucretia Kern, DO

## 2018-01-22 ENCOUNTER — Ambulatory Visit (INDEPENDENT_AMBULATORY_CARE_PROVIDER_SITE_OTHER): Payer: BLUE CROSS/BLUE SHIELD | Admitting: Family Medicine

## 2018-01-22 ENCOUNTER — Encounter: Payer: Self-pay | Admitting: Family Medicine

## 2018-01-22 VITALS — BP 100/58 | HR 93 | Temp 98.5°F | Ht 64.0 in | Wt 153.7 lb

## 2018-01-22 DIAGNOSIS — Z1331 Encounter for screening for depression: Secondary | ICD-10-CM | POA: Diagnosis not present

## 2018-01-22 DIAGNOSIS — E785 Hyperlipidemia, unspecified: Secondary | ICD-10-CM | POA: Diagnosis not present

## 2018-01-22 DIAGNOSIS — Z720 Tobacco use: Secondary | ICD-10-CM

## 2018-01-22 DIAGNOSIS — R300 Dysuria: Secondary | ICD-10-CM

## 2018-01-22 DIAGNOSIS — R739 Hyperglycemia, unspecified: Secondary | ICD-10-CM

## 2018-01-22 DIAGNOSIS — G5601 Carpal tunnel syndrome, right upper limb: Secondary | ICD-10-CM | POA: Insufficient documentation

## 2018-01-22 DIAGNOSIS — G5600 Carpal tunnel syndrome, unspecified upper limb: Secondary | ICD-10-CM | POA: Insufficient documentation

## 2018-01-22 DIAGNOSIS — I700 Atherosclerosis of aorta: Secondary | ICD-10-CM | POA: Diagnosis not present

## 2018-01-22 DIAGNOSIS — Z9189 Other specified personal risk factors, not elsewhere classified: Secondary | ICD-10-CM

## 2018-01-22 DIAGNOSIS — I10 Essential (primary) hypertension: Secondary | ICD-10-CM | POA: Diagnosis not present

## 2018-01-22 DIAGNOSIS — Z0000 Encounter for general adult medical examination without abnormal findings: Secondary | ICD-10-CM | POA: Diagnosis not present

## 2018-01-22 DIAGNOSIS — G5603 Carpal tunnel syndrome, bilateral upper limbs: Secondary | ICD-10-CM

## 2018-01-22 DIAGNOSIS — F17210 Nicotine dependence, cigarettes, uncomplicated: Secondary | ICD-10-CM

## 2018-01-22 DIAGNOSIS — Z1159 Encounter for screening for other viral diseases: Secondary | ICD-10-CM

## 2018-01-22 DIAGNOSIS — R202 Paresthesia of skin: Secondary | ICD-10-CM | POA: Diagnosis not present

## 2018-01-22 DIAGNOSIS — E049 Nontoxic goiter, unspecified: Secondary | ICD-10-CM | POA: Diagnosis not present

## 2018-01-22 DIAGNOSIS — F39 Unspecified mood [affective] disorder: Secondary | ICD-10-CM

## 2018-01-22 LAB — BASIC METABOLIC PANEL
BUN: 13 mg/dL (ref 6–23)
CALCIUM: 10 mg/dL (ref 8.4–10.5)
CO2: 31 meq/L (ref 19–32)
CREATININE: 0.75 mg/dL (ref 0.40–1.20)
Chloride: 99 mEq/L (ref 96–112)
GFR: 103.17 mL/min (ref >60.00)
Glucose, Bld: 88 mg/dL (ref 70–99)
Potassium: 3.8 mEq/L (ref 3.5–5.1)
Sodium: 137 mEq/L (ref 135–145)

## 2018-01-22 LAB — POCT URINALYSIS DIPSTICK
Bilirubin, UA: NEGATIVE
Glucose, UA: NEGATIVE
Ketones, UA: NEGATIVE
NITRITE UA: NEGATIVE
PH UA: 5 (ref 5.0–8.0)
PROTEIN UA: NEGATIVE
RBC UA: NEGATIVE
Spec Grav, UA: 1.025 (ref 1.010–1.025)
Urobilinogen, UA: 0.2 E.U./dL

## 2018-01-22 LAB — CBC
HCT: 41.2 % (ref 36.0–46.0)
Hemoglobin: 14.5 g/dL (ref 12.0–15.0)
MCHC: 35.3 g/dL (ref 30.0–36.0)
MCV: 94.9 fl (ref 78.0–100.0)
Platelets: 207 10*3/uL (ref 150.0–400.0)
RBC: 4.34 Mil/uL (ref 3.87–5.11)
RDW: 12.8 % (ref 11.5–15.5)
WBC: 6.7 10*3/uL (ref 4.0–10.5)

## 2018-01-22 LAB — LIPID PANEL
CHOLESTEROL: 223 mg/dL — AB (ref 0–200)
HDL: 58.7 mg/dL (ref >39.00)
LDL CALC: 143 mg/dL — AB (ref 0–99)
NonHDL: 164.12
TRIGLYCERIDES: 104 mg/dL (ref 0.0–149.0)
Total CHOL/HDL Ratio: 4
VLDL: 20.8 mg/dL (ref 0.0–40.0)

## 2018-01-22 LAB — VITAMIN B12: Vitamin B-12: 443 pg/mL (ref 211–911)

## 2018-01-22 LAB — HEMOGLOBIN A1C: Hgb A1c MFr Bld: 5.6 % (ref 4.6–6.5)

## 2018-01-22 NOTE — Patient Instructions (Addendum)
BEFORE YOU LEAVE: -urinalysis -order cologuard again -labs -? Flu vaccine if she wishes -follow up: 3 months  We ordered the Cologuard test for colon cancer screening. Please complete this test promptly once the kit arrives. Please contact us if you have not received your kit in the next few weeks.  See your gynecologist as planned and get mammogram.  We will check the thyroid level and send to your endocrinologist as you requested.  Quit smoking. Have a plan in place (chew gum, walk, etc when you get cravings), call quit line, patches.  Cock up wrist splints for hands. Call if symptoms persist.  We have ordered labs or studies at this visit. It can take up to 1-2 weeks for results and processing. IF results require follow up or explanation, we will call you with instructions. Clinically stable results will be released to your [facility]. If you have not heard from [place] or cannot find your results in [facility] in 2 weeks please contact our office at [phone].  If you are not yet signed up for [facility], please consider signing up.   Preventive Care 40-64 Years, Female Preventive care refers to lifestyle choices and visits with your health care provider that can promote health and wellness. What does preventive care include?  A yearly physical exam. This is also called an annual well check.  Dental exams once or twice a year.  Routine eye exams. Ask your health care provider how often you should have your eyes checked.  Personal lifestyle choices, including: ? Daily care of your teeth and gums. ? Regular physical activity. ? Eating a healthy diet. ? Avoiding tobacco and drug use. ? Limiting alcohol use. ? Practicing safe sex. ? Taking low-dose aspirin daily starting at age 29. ? Taking vitamin and mineral supplements as recommended by your health care provider. What happens during an annual well check? The services and screenings done by your health care provider during your annual  well check will depend on your age, overall health, lifestyle risk factors, and family history of disease. Counseling Your health care provider may ask you questions about your:  Alcohol use.  Tobacco use.  Drug use.  Emotional well-being.  Home and relationship well-being.  Sexual activity.  Eating habits.  Work and work Statistician.  Method of birth control.  Menstrual cycle.  Pregnancy history.  Screening You may have the following tests or measurements:  Height, weight, and BMI.  Blood pressure.  Lipid and cholesterol levels. These may be checked every 5 years, or more frequently if you are over 96 years old.  Skin check.  Lung cancer screening. You may have this screening every year starting at age 36 if you have a 30-pack-year history of smoking and currently smoke or have quit within the past 15 years.  Fecal occult blood test (FOBT) of the stool. You may have this test every year starting at age 25.  Flexible sigmoidoscopy or colonoscopy. You may have a sigmoidoscopy every 5 years or a colonoscopy every 10 years starting at age 68.  Hepatitis C blood test.  Hepatitis B blood test.  Sexually transmitted disease (STD) testing.  Diabetes screening. This is done by checking your blood sugar (glucose) after you have not eaten for a while (fasting). You may have this done every 1-3 years.  Mammogram. This may be done every 1-2 years. Talk to your health care provider about when you should start having regular mammograms. This may depend on whether you have a family history  of breast cancer.  BRCA-related cancer screening. This may be done if you have a family history of breast, ovarian, tubal, or peritoneal cancers.  Pelvic exam and Pap test. This may be done every 3 years starting at age 55. Starting at age 51, this may be done every 5 years if you have a Pap test in combination with an HPV test.  Bone density scan. This is done to screen for osteoporosis.  You may have this scan if you are at high risk for osteoporosis.  Discuss your test results, treatment options, and if necessary, the need for more tests with your health care provider. Vaccines Your health care provider may recommend certain vaccines, such as:  Influenza vaccine. This is recommended every year.  Tetanus, diphtheria, and acellular pertussis (Tdap, Td) vaccine. You may need a Td booster every 10 years.  Varicella vaccine. You may need this if you have not been vaccinated.  Zoster vaccine. You may need this after age 28.  Measles, mumps, and rubella (MMR) vaccine. You may need at least one dose of MMR if you were born in 1957 or later. You may also need a second dose.  Pneumococcal 13-valent conjugate (PCV13) vaccine. You may need this if you have certain conditions and were not previously vaccinated.  Pneumococcal polysaccharide (PPSV23) vaccine. You may need one or two doses if you smoke cigarettes or if you have certain conditions.  Meningococcal vaccine. You may need this if you have certain conditions.  Hepatitis A vaccine. You may need this if you have certain conditions or if you travel or work in places where you may be exposed to hepatitis A.  Hepatitis B vaccine. You may need this if you have certain conditions or if you travel or work in places where you may be exposed to hepatitis B.  Haemophilus influenzae type b (Hib) vaccine. You may need this if you have certain conditions.  Talk to your health care provider about which screenings and vaccines you need and how often you need them. This information is not intended to replace advice given to you by your health care provider. Make sure you discuss any questions you have with your health care provider. Document Released: 11/03/2015 Document Revised: 06/26/2016 Document Reviewed: 08/08/2015 Elsevier Interactive Patient Education  [employer].

## 2018-01-23 LAB — HIV ANTIBODY (ROUTINE TESTING W REFLEX): HIV 1&2 Ab, 4th Generation: NONREACTIVE

## 2018-01-23 LAB — URINE CULTURE
MICRO NUMBER: 90417810
SPECIMEN QUALITY:: ADEQUATE

## 2018-01-23 LAB — HEPATITIS C ANTIBODY
HEP C AB: NONREACTIVE
SIGNAL TO CUT-OFF: 0.04 (ref <1.00)

## 2018-01-26 ENCOUNTER — Encounter: Payer: Self-pay | Admitting: Family Medicine

## 2018-01-26 ENCOUNTER — Encounter: Payer: Self-pay | Admitting: Endocrinology

## 2018-01-27 NOTE — Telephone Encounter (Signed)
See results note-patient informed of the results.

## 2018-01-28 ENCOUNTER — Encounter: Payer: Self-pay | Admitting: Obstetrics & Gynecology

## 2018-01-28 ENCOUNTER — Ambulatory Visit: Payer: BLUE CROSS/BLUE SHIELD | Admitting: Obstetrics & Gynecology

## 2018-01-28 VITALS — BP 122/80 | Temp 98.2°F

## 2018-01-28 DIAGNOSIS — N898 Other specified noninflammatory disorders of vagina: Secondary | ICD-10-CM | POA: Diagnosis not present

## 2018-01-28 DIAGNOSIS — E039 Hypothyroidism, unspecified: Secondary | ICD-10-CM | POA: Diagnosis not present

## 2018-01-28 DIAGNOSIS — N951 Menopausal and female climacteric states: Secondary | ICD-10-CM

## 2018-01-28 DIAGNOSIS — Z13820 Encounter for screening for osteoporosis: Secondary | ICD-10-CM

## 2018-01-28 DIAGNOSIS — R300 Dysuria: Secondary | ICD-10-CM | POA: Diagnosis not present

## 2018-01-28 LAB — WET PREP FOR TRICH, YEAST, CLUE

## 2018-01-28 MED ORDER — TINIDAZOLE 500 MG PO TABS: 2.0000 g | ORAL_TABLET | Freq: Every day | ORAL | 0 refills | Status: AC

## 2018-01-28 MED ORDER — ESTROGENS, CONJUGATED 0.625 MG/GM VA CREA: 0.2500 | TOPICAL_CREAM | VAGINAL | 5 refills | Status: DC

## 2018-01-28 NOTE — Progress Notes (Signed)
    Ariel [name] 10-12-1961 [id number]        57 y.o.  G2P2   RP: Urinary frequency and increased vaginal discharge for a few days  HPI: Patient complains of urinary frequency with no burning when passing urine.  No fever.  Also has an increase in her vaginal discharge.  No itching.  Mild odor.  No pelvic pain.  Patient is menopausal on no hormone replacement therapy.   is status post hysterectomy.  Superficial pain with intercourse, becomes irritated.     OB History  Gravida Para Term Preterm AB Living  2 2       2   SAB TAB Ectopic Multiple Live Births               # Outcome Date GA Lbr Len/2nd Weight Sex Delivery Anes PTL Lv  2 Para           1 Para             Past medical history,surgical history, problem list, medications, allergies, family history and social history were all reviewed and documented in the EPIC chart.   Directed ROS with pertinent positives and negatives documented in the history of present illness/assessment and plan.  Exam:  Vitals:   01/28/18 0827  BP: 122/80  Temp: 98.2 F (36.8 C)  TempSrc: Oral   General appearance:  Normal  Abdomen: Normal  CVAT negative bilaterally  Gynecologic exam: Vulva with menopausal atrophy.  Speculum: Vaginal vault normal.  Increased vaginal discharge.  Wet prep done.  U/A: Yellow clear, nitrites negative, white blood cells 6-10, red blood cells 3-10, few bacteria. Wet prep: Clue cells present, bacterial vaginosis.   Assessment/Plan:  57 y.o. G2P2   1. Vaginal dryness, menopausal Atrophic vaginitis of menopause.  Superficial dyspareunia.  Decision to start on Premarin cream.  Usage, risks and benefits reviewed.  No contraindication.  Prescription sent to pharmacy.  2. Vaginal discharge Bacterial vaginosis.  Diagnosis discussed with patient.  Decision to treat with tinidazole.  Usage reviewed with patient.  Treatment sent to pharmacy. - WET PREP FOR [place], YEAST, CLUE  3. Dysuria Urine analysis mildly  perturbed.  Decision to wait on urine culture. - Urinalysis,Complete w/RFL Culture  4. Screening for osteoporosis Vitamin D supplements, calcium rich nutrition and regular weightbearing physical activity recommended.  Patient will schedule bone density here now. - DG Bone Density; Future - VITAMIN D 25 Hydroxy (Vit-D Deficiency, Fractures)  5. Acquired hypothyroidism History of hypothyroidism.  Will verify thyroid function with a TSH today. - TSH  Other orders - tinidazole (TINDAMAX) 500 MG tablet; Take 4 tablets (2,000 mg total) by mouth daily for 2 days. - conjugated estrogens (PREMARIN) vaginal cream; Place 4.56 Applicatorfuls vaginally 2 (two) times a week.  Counseling on above issues and coordination of care more than 50% for 25 minutes.  Princess Bruins MD, 8:34 AM 01/28/2018

## 2018-01-29 LAB — VITAMIN D 25 HYDROXY (VIT D DEFICIENCY, FRACTURES): Vit D, 25-Hydroxy: 30 ng/mL (ref 30–100)

## 2018-01-29 LAB — TSH: TSH: 1.46 mIU/L

## 2018-01-30 ENCOUNTER — Encounter: Payer: Self-pay | Admitting: Obstetrics & Gynecology

## 2018-01-30 LAB — URINALYSIS, COMPLETE W/RFL CULTURE
BILIRUBIN URINE: NEGATIVE
Glucose, UA: NEGATIVE
HYALINE CAST: NONE SEEN /LPF
KETONES UR: NEGATIVE
Nitrites, Initial: NEGATIVE
Protein, ur: NEGATIVE
SPECIFIC GRAVITY, URINE: 1.002 (ref 1.001–1.03)
pH: 6.5 (ref 5.0–8.0)

## 2018-01-30 LAB — URINE CULTURE
MICRO NUMBER:: 90447310
SPECIMEN QUALITY: ADEQUATE

## 2018-01-30 LAB — CULTURE INDICATED

## 2018-01-30 NOTE — Telephone Encounter (Signed)
Dr.Lavoie patient vitamin d is lower end of normal, do you recommend she take a vitamin d supplement 1,000 or 2,000 units OTC?

## 2018-02-01 ENCOUNTER — Encounter: Payer: Self-pay | Admitting: Obstetrics & Gynecology

## 2018-02-01 NOTE — Patient Instructions (Addendum)
1. Vaginal dryness, menopausal Atrophic vaginitis of menopause.  Superficial dyspareunia.  Decision to start on Premarin cream.  Usage, risks and benefits reviewed.  No contraindication.  Prescription sent to pharmacy.  2. Vaginal discharge Bacterial vaginosis.  Diagnosis discussed with patient.  Decision to treat with tinidazole.  Usage reviewed with patient.  Treatment sent to pharmacy. - WET PREP FOR [place], YEAST, CLUE  3. Dysuria Urine analysis mildly perturbed.  Decision to wait on urine culture. - Urinalysis,Complete w/RFL Culture  4. Screening for osteoporosis Vitamin D supplements, calcium rich nutrition and regular weightbearing physical activity recommended.  Patient will schedule bone density here now. - DG Bone Density; Future - VITAMIN D 25 Hydroxy (Vit-D Deficiency, Fractures)  5. Acquired hypothyroidism History of hypothyroidism.  Will verify thyroid function with a TSH today. - TSH  Other orders - tinidazole (TINDAMAX) 500 MG tablet; Take 4 tablets (2,000 mg total) by mouth daily for 2 days. - conjugated estrogens (PREMARIN) vaginal cream; Place 4.26 Applicatorfuls vaginally 2 (two) times a week.  Leoma, it was a pleasure seeing you today!  I will inform you of your results as soon as they are available.   Bacterial Vaginosis Bacterial vaginosis is a vaginal infection that occurs when the normal balance of bacteria in the vagina is disrupted. It results from an overgrowth of certain bacteria. This is the most common vaginal infection among women ages 32-44. Because bacterial vaginosis increases your risk for STIs (sexually transmitted infections), getting treated can help reduce your risk for chlamydia, gonorrhea, herpes, and HIV (human immunodeficiency virus). Treatment is also important for preventing complications in pregnant women, because this condition can cause an early (premature) delivery. What are the causes? This condition is caused by an increase in harmful  bacteria that are normally present in small amounts in the vagina. However, the reason that the condition develops is not fully understood. What increases the risk? The following factors may make you more likely to develop this condition:  Having a new sexual partner or multiple sexual partners.  Having unprotected sex.  Douching.  Having an intrauterine device (IUD).  Smoking.  Drug and alcohol abuse.  Taking certain antibiotic medicines.  Being pregnant.  You cannot get bacterial vaginosis from toilet seats, bedding, swimming pools, or contact with objects around you. What are the signs or symptoms? Symptoms of this condition include:  Grey or white vaginal discharge. The discharge can also be watery or foamy.  A fish-like odor with discharge, especially after sexual intercourse or during menstruation.  Itching in and around the vagina.  Burning or pain with urination.  Some women with bacterial vaginosis have no signs or symptoms. How is this diagnosed? This condition is diagnosed based on:  Your medical history.  A physical exam of the vagina.  Testing a sample of vaginal fluid under a microscope to look for a large amount of bad bacteria or abnormal cells. Your health care provider may use a cotton swab or a small wooden spatula to collect the sample.  How is this treated? This condition is treated with antibiotics. These may be given as a pill, a vaginal cream, or a medicine that is put into the vagina (suppository). If the condition comes back after treatment, a second round of antibiotics may be needed. Follow these instructions at home: Medicines  Take over-the-counter and prescription medicines only as told by your health care provider.  Take or use your antibiotic as told by your health care provider. Do not stop taking  or using the antibiotic even if you start to feel better. General instructions  If you have a female sexual partner, tell her that you  have a vaginal infection. She should see her health care provider and be treated if she has symptoms. If you have a female sexual partner, he does not need treatment.  During treatment: ? Avoid sexual activity until you finish treatment. ? Do not douche. ? Avoid alcohol as directed by your health care provider. ? Avoid breastfeeding as directed by your health care provider.  Drink enough water and fluids to keep your urine clear or pale yellow.  Keep the area around your vagina and rectum clean. ? Wash the area daily with warm water. ? Wipe yourself from front to back after using the toilet.  Keep all follow-up visits as told by your health care provider. This is important. How is this prevented?  Do not douche.  Wash the outside of your vagina with warm water only.  Use protection when having sex. This includes latex condoms and dental dams.  Limit how many sexual partners you have. To help prevent bacterial vaginosis, it is best to have sex with just one partner (monogamous).  Make sure you and your sexual partner are tested for STIs.  Wear cotton or cotton-lined underwear.  Avoid wearing tight pants and pantyhose, especially during summer.  Limit the amount of alcohol that you drink.  Do not use any products that contain nicotine or tobacco, such as cigarettes and e-cigarettes. If you need help quitting, ask your health care provider.  Do not use illegal drugs. Where to find more information:  Centers for Disease Control and Prevention: AppraiserFraud.fi  American Sexual Health Association (ASHA): www.ashastd.org  U.S. Department of Health and Financial controller, Office on Women's Health: DustingSprays.pl or SecuritiesCard.it Contact a health care provider if:  Your symptoms do not improve, even after treatment.  You have more discharge or pain when urinating.  You have a fever.  You have pain in your abdomen.  You have  pain during sex.  You have vaginal bleeding between periods. Summary  Bacterial vaginosis is a vaginal infection that occurs when the normal balance of bacteria in the vagina is disrupted.  Because bacterial vaginosis increases your risk for STIs (sexually transmitted infections), getting treated can help reduce your risk for chlamydia, gonorrhea, herpes, and HIV (human immunodeficiency virus). Treatment is also important for preventing complications in pregnant women, because the condition can cause an early (premature) delivery.  This condition is treated with antibiotic medicines. These may be given as a pill, a vaginal cream, or a medicine that is put into the vagina (suppository). This information is not intended to replace advice given to you by your health care provider. Make sure you discuss any questions you have with your health care provider. Document Released: 10/07/2005 Document Revised: 02/10/2017 Document Reviewed: 06/22/2016 Elsevier Interactive Patient Education  [employer].

## 2018-02-03 ENCOUNTER — Other Ambulatory Visit: Payer: Self-pay | Admitting: Gynecology

## 2018-02-03 DIAGNOSIS — Z13820 Encounter for screening for osteoporosis: Secondary | ICD-10-CM

## 2018-02-04 ENCOUNTER — Encounter: Payer: Self-pay | Admitting: Obstetrics & Gynecology

## 2018-02-16 ENCOUNTER — Ambulatory Visit (INDEPENDENT_AMBULATORY_CARE_PROVIDER_SITE_OTHER): Payer: BLUE CROSS/BLUE SHIELD

## 2018-02-16 ENCOUNTER — Other Ambulatory Visit: Payer: Self-pay | Admitting: Obstetrics & Gynecology

## 2018-02-16 DIAGNOSIS — M8589 Other specified disorders of bone density and structure, multiple sites: Secondary | ICD-10-CM | POA: Diagnosis not present

## 2018-02-16 DIAGNOSIS — Z13820 Encounter for screening for osteoporosis: Secondary | ICD-10-CM | POA: Diagnosis not present

## 2018-02-16 DIAGNOSIS — Z1231 Encounter for screening mammogram for malignant neoplasm of breast: Secondary | ICD-10-CM

## 2018-02-19 ENCOUNTER — Encounter: Payer: Self-pay | Admitting: Gynecology

## 2018-02-19 ENCOUNTER — Other Ambulatory Visit: Payer: Self-pay | Admitting: Gynecology

## 2018-02-19 DIAGNOSIS — Z13820 Encounter for screening for osteoporosis: Secondary | ICD-10-CM

## 2018-02-19 DIAGNOSIS — M8589 Other specified disorders of bone density and structure, multiple sites: Secondary | ICD-10-CM

## 2018-03-09 ENCOUNTER — Encounter: Payer: Self-pay | Admitting: Family Medicine

## 2018-03-09 MED ORDER — HYDROCHLOROTHIAZIDE 25 MG PO TABS: 25.0000 mg | ORAL_TABLET | Freq: Every day | ORAL | 0 refills | Status: DC

## 2018-03-10 ENCOUNTER — Ambulatory Visit
Admission: RE | Admit: 2018-03-10 | Discharge: 2018-03-10 | Disposition: A | Discharge status: Home / Self Care | Payer: BLUE CROSS/BLUE SHIELD | Source: Ambulatory Visit | Attending: Endocrinology | Admitting: Endocrinology

## 2018-03-10 ENCOUNTER — Ambulatory Visit
Admission: RE | Admit: 2018-03-10 | Discharge: 2018-03-10 | Disposition: A | Discharge status: Home / Self Care | Payer: BLUE CROSS/BLUE SHIELD | Source: Ambulatory Visit | Attending: Obstetrics & Gynecology | Admitting: Obstetrics & Gynecology

## 2018-03-10 DIAGNOSIS — Z1231 Encounter for screening mammogram for malignant neoplasm of breast: Secondary | ICD-10-CM

## 2018-03-10 DIAGNOSIS — E049 Nontoxic goiter, unspecified: Secondary | ICD-10-CM

## 2018-04-28 ENCOUNTER — Ambulatory Visit: Payer: BLUE CROSS/BLUE SHIELD | Admitting: Family Medicine

## 2018-05-26 ENCOUNTER — Encounter: Payer: Self-pay | Admitting: Family Medicine

## 2018-05-26 MED ORDER — HYDROCHLOROTHIAZIDE 25 MG PO TABS: 25.0000 mg | ORAL_TABLET | Freq: Every day | ORAL | 0 refills | Status: DC

## 2018-08-06 ENCOUNTER — Encounter: Payer: Self-pay | Admitting: Family Medicine

## 2018-08-06 ENCOUNTER — Ambulatory Visit (INDEPENDENT_AMBULATORY_CARE_PROVIDER_SITE_OTHER): Payer: BLUE CROSS/BLUE SHIELD | Admitting: Obstetrics & Gynecology

## 2018-08-06 ENCOUNTER — Encounter: Payer: Self-pay | Admitting: Obstetrics & Gynecology

## 2018-08-06 VITALS — BP 132/78

## 2018-08-06 DIAGNOSIS — R300 Dysuria: Secondary | ICD-10-CM | POA: Diagnosis not present

## 2018-08-06 MED ORDER — HYDROCHLOROTHIAZIDE 25 MG PO TABS: 25.0000 mg | ORAL_TABLET | Freq: Every day | ORAL | 0 refills | Status: DC

## 2018-08-06 MED ORDER — CIPROFLOXACIN HCL 500 MG PO TABS: 500.0000 mg | ORAL_TABLET | Freq: Two times a day (BID) | ORAL | 0 refills | Status: AC

## 2018-08-06 NOTE — Progress Notes (Signed)
    Ariel [name] 1961-08-31 [id number]        57 y.o.  G2P2   RP: Dysuria and frequency of urination x 2 days  HPI: Pain with urination and frequency of urination for 2 days.  No blood in urine.  No pelvic pain.  No vaginal discharge.  No fever.   OB History  Gravida Para Term Preterm AB Living  2 2       2   SAB TAB Ectopic Multiple Live Births               # Outcome Date GA Lbr Len/2nd Weight Sex Delivery Anes PTL Lv  2 Para           1 Para             Past medical history,surgical history, problem list, medications, allergies, family history and social history were all reviewed and documented in the EPIC chart.   Directed ROS with pertinent positives and negatives documented in the history of present illness/assessment and plan.  Exam:  Vitals:   08/06/18 1053  BP: 132/78   General appearance:  Normal  CVAT negative bilaterally  Gynecologic exam: Deferred  U/A Abnormal   Assessment/Plan:  57 y.o. G2P2   1. Dysuria Abnormal urine analysis compatible with an acute cystitis.  Decision to proceed with treatment.  Ciprofloxacin usage reviewed and prescription sent to pharmacy.  Pending urine culture. - Urinalysis,Complete w/RFL Culture  Other orders - ciprofloxacin (CIPRO) 500 MG tablet; Take 1 tablet (500 mg total) by mouth 2 (two) times daily for 7 days.  Counseling on above issues and coordination of care more than 50% for 15 minutes.  Princess Bruins MD, 11:07 AM 08/06/2018

## 2018-08-08 LAB — URINALYSIS, COMPLETE W/RFL CULTURE
BILIRUBIN URINE: NEGATIVE
GLUCOSE, UA: NEGATIVE
Hyaline Cast: NONE SEEN /LPF
Ketones, ur: NEGATIVE
NITRITES URINE, INITIAL: NEGATIVE
PROTEIN: NEGATIVE
Specific Gravity, Urine: 1.01 (ref 1.001–1.03)
pH: 7 (ref 5.0–8.0)

## 2018-08-08 LAB — URINE CULTURE
MICRO NUMBER: 91255038
SPECIMEN QUALITY:: ADEQUATE

## 2018-08-08 LAB — CULTURE INDICATED

## 2018-08-09 ENCOUNTER — Encounter: Payer: Self-pay | Admitting: Obstetrics & Gynecology

## 2018-08-09 NOTE — Patient Instructions (Signed)
1. Dysuria Abnormal urine analysis compatible with an acute cystitis.  Decision to proceed with treatment.  Ciprofloxacin usage reviewed and prescription sent to pharmacy.  Pending urine culture. - Urinalysis,Complete w/RFL Culture  Other orders - ciprofloxacin (CIPRO) 500 MG tablet; Take 1 tablet (500 mg total) by mouth 2 (two) times daily for 7 days.  Ariel [name], it was a pleasure seeing you today!  I will inform you of your urine culture as soon as it is available.

## 2018-11-03 ENCOUNTER — Encounter: Payer: Self-pay | Admitting: Family Medicine

## 2018-11-03 NOTE — Telephone Encounter (Signed)
Patient called back and was informed of the message below.  She stated she has noticed her equilibrium has been off and she has this checked yesterday at work.  I offered an an appt here tomorrow with a different provider and she stated would prefer to wait to see Ariel [name] and an appt was scheduled for Thursday.  I advised her to go to an urgent care or the ER in the meantime if she develops any severe symptoms and she agreed.

## 2018-11-04 NOTE — Progress Notes (Signed)
HPI:  Using dictation device. Unfortunately this device frequently misinterprets words/phrases. Due for labs, statin, colon ca screening, flu shot  Acute visit for Hypertension: -meds: hctz -she checked BP at work wellness visit and was high -unfortunately, hx of poor compliance and did not follow up per our recommendations at her last visit and has not been in for > 9 months - she unfortunately had lapse in insurance -past due for flu shot, colon cancer screening (we have advised her to do, but she refused) -also hx of hyperlipidemia, aortic atherosclerosis and past due for the recheck we advised, she did not follow our recommendation -denies: CP, SOB, DOE  R knee pain: -hit knee on car door 1 month ago -had swelling and pain since -hurts with walking or bending and persistent swelling -feels like may give out at times -no locking, no falls, no weakness or numbness   ROS: See pertinent positives and negatives per HPI.  Past Medical History:  Diagnosis Date  . Chronic bronchitis ([place])   . Fibroids, intramural   . Goiter   . History of hypertension   . Hyperlipidemia   . Osteopenia 01/2018   T score -1.3 FRAX 3.8% / 0.5%  . Thyroid disease     Past Surgical History:  Procedure Laterality Date  . ABDOMINAL HYSTERECTOMY     07/2017-Dr [place] per patient  . CARDIAC ELECTROPHYSIOLOGY STUDY AND ABLATION  2010   hx tachycardia   . LAPAROTOMY N/A 06/26/2017   Procedure: EXPLORATORY LAPAROTOMY;  Surgeon: Everitt Amber, MD;  Location: [facility];  Service: Gynecology;  Laterality: N/A;  . TONSILLECTOMY  1967  . TUBAL LIGATION     X2, REVERSAL     Family History  Problem Relation Age of Onset  . Thyroid disease Maternal Aunt   . Hypertension Mother   . Renal Disease Mother   . Diabetes Father   . Hypertension Father   . Renal Disease Father   . Stroke Maternal Grandmother   . Breast cancer Maternal Grandmother        diagnosed in her 23's  . Cancer Paternal  Grandmother        ?    SOCIAL HX: see hpi   Current Outpatient Medications:  .  aspirin EC 81 MG tablet, Take 81 mg by mouth daily., Disp: , Rfl:  .  Calcium Carbonate-Vit D-Min (CALCIUM 1200 PO), Take by mouth., Disp: , Rfl:  .  conjugated estrogens (PREMARIN) vaginal cream, Place 4.00 Applicatorfuls vaginally 2 (two) times a week., Disp: 42.5 g, Rfl: 5 .  hydrochlorothiazide (HYDRODIURIL) 25 MG tablet, Take 1 tablet (25 mg total) by mouth daily., Disp: 90 tablet, Rfl: 0 .  magnesium citrate solution, Take 296 mLs by mouth as needed., Disp: , Rfl:  .  Multiple Vitamin (MULTIVITAMIN) capsule, Take 1 capsule by mouth daily. Centrum silver, Disp: , Rfl:  .  vitamin B-12 (CYANOCOBALAMIN) 500 MCG tablet, Take 500 mcg by mouth daily., Disp: , Rfl:  .  amLODipine (NORVASC) 5 MG tablet, Take 1 tablet (5 mg total) by mouth daily., Disp: 30 tablet, Rfl: 1  EXAM:  Vitals:   11/05/18 1304  BP: (!) 140/92  Pulse: 87  Temp: 98.1 F (36.7 C)  SpO2: 96%    Body mass index is 27.07 kg/m.  GENERAL: vitals reviewed and listed above, alert, oriented, appears well hydrated and in no acute distress  HEENT: atraumatic, conjunttiva clear, no obvious abnormalities on inspection of external nose and ears  NECK: no obvious  masses on inspection  LUNGS: clear to auscultation bilaterally, no wheezes, rales or rhonchi, good air movement  CV: HRRR, no peripheral edema  MS: moves all extremities without noticeable abnormality, she is uncomfortable getting up from chair, swelling around R knee, ? Bakers cyst, remarkable patellar crepitus, neg an/pos drawer, neg lachman, neg mcmurry, NV intact distal. No redness or warmth.  PSYCH: pleasant and cooperative, no obvious depression or anxiety  ASSESSMENT AND PLAN:  Discussed the following assessment and plan:  Hyperlipidemia, unspecified hyperlipidemia type  Aortic atherosclerosis ([place]) - Plan: HDL cholesterol, Cholesterol, total  Hypertension,  unspecified type - Plan: Basic metabolic panel, CBC  Acute pain of right knee - Plan: DG Knee Complete 4 Views Right, DG Knee Complete 4 Views Right  Tobacco use  GAD (generalized anxiety disorder)  -labs, lifestyle recs - advised healthy diet, regular exercise, smoking cessation -discussed tx BP and risks, opted to add norvasc -xray R knee and likely sports med referral pending results -follow up 1 month, sooner as needed -advised colon cancer screening, she reports has cologuard test to do -Patient advised to return or notify a doctor immediately if symptoms worsen or persist or new concerns arise.  Patient Instructions  BEFORE YOU LEAVE: -labs -xray -follow up: 1 month  ADD Norvasc (amlodipine) 5 mg and take once daily in the morning for blood pressure. Continue the hydrochlorothiazide.  Quit smoking.  Eat a healthy Mediterranean diet and get at least 150 minutes of aerobic exercise per week.  Complete the Cologuard test for colon cancer screening immediately.     We have ordered labs or studies at this visit. It can take up to 1-2 weeks for results and processing. IF results require follow up or explanation, we will call you with instructions. Clinically stable results will be released to your [facility]. If you have not heard from [place] or cannot find your results in [facility] in 2 weeks please contact our office at [phone].  If you are not yet signed up for [facility], please consider signing up.    We recommend the following healthy lifestyle for LIFE: 1) Small portions. But, make sure to get regular (at least 3 per day), healthy meals and small healthy snacks if needed.  2) Eat a healthy clean diet.   TRY TO EAT: -at least 5-7 servings of low sugar, colorful, and nutrient rich vegetables per day (not corn, potatoes or bananas.) -berries are the best choice if you wish to eat fruit (only eat small amounts if trying to reduce weight)  -lean meets (fish, white meat of  chicken or [place]) -vegan proteins for some meals - beans or tofu, whole grains, nuts and seeds -Replace bad fats with good fats - good fats include: fish, nuts and seeds, canola oil, olive oil -small amounts of low fat or non fat dairy -small amounts of100 % whole grains - check the lables -drink plenty of water  AVOID: -SUGAR, sweets, anything with added sugar, corn syrup or sweeteners - must read labels as even foods advertised as "healthy" often are loaded with sugar -if you must have a sweetener, small amounts of stevia may be best -sweetened beverages and artificially sweetened beverages -simple starches (rice, bread, potatoes, pasta, chips, etc - small amounts of 100% whole grains are ok) -red meat, pork, butter -fried foods, fast food, processed food, excessive dairy, eggs and coconut.  3)Get at least 150 minutes of sweaty aerobic exercise per week.  4)Reduce stress - consider counseling, meditation and relaxation to balance  other aspects of your life.        Lucretia Kern, DO

## 2018-11-05 ENCOUNTER — Ambulatory Visit (INDEPENDENT_AMBULATORY_CARE_PROVIDER_SITE_OTHER): Payer: 59

## 2018-11-05 ENCOUNTER — Encounter: Payer: Self-pay | Admitting: Family Medicine

## 2018-11-05 ENCOUNTER — Ambulatory Visit: Payer: 59 | Admitting: Family Medicine

## 2018-11-05 VITALS — BP 140/92 | HR 87 | Temp 98.1°F | Ht 64.0 in | Wt 157.7 lb

## 2018-11-05 DIAGNOSIS — M25561 Pain in right knee: Secondary | ICD-10-CM

## 2018-11-05 DIAGNOSIS — I700 Atherosclerosis of aorta: Secondary | ICD-10-CM | POA: Diagnosis not present

## 2018-11-05 DIAGNOSIS — E785 Hyperlipidemia, unspecified: Secondary | ICD-10-CM | POA: Diagnosis not present

## 2018-11-05 DIAGNOSIS — I10 Essential (primary) hypertension: Secondary | ICD-10-CM

## 2018-11-05 DIAGNOSIS — F411 Generalized anxiety disorder: Secondary | ICD-10-CM

## 2018-11-05 DIAGNOSIS — Z720 Tobacco use: Secondary | ICD-10-CM

## 2018-11-05 LAB — BASIC METABOLIC PANEL
BUN: 12 mg/dL (ref 6–23)
CHLORIDE: 102 meq/L (ref 96–112)
CO2: 32 mEq/L (ref 19–32)
CREATININE: 0.96 mg/dL (ref 0.40–1.20)
Calcium: 10.2 mg/dL (ref 8.4–10.5)
GFR: 72.8 mL/min (ref >60.00)
Glucose, Bld: 92 mg/dL (ref 70–99)
Potassium: 4 mEq/L (ref 3.5–5.1)
Sodium: 141 mEq/L (ref 135–145)

## 2018-11-05 LAB — CBC
HEMATOCRIT: 42.1 % (ref 36.0–46.0)
Hemoglobin: 14.8 g/dL (ref 12.0–15.0)
MCHC: 35.1 g/dL (ref 30.0–36.0)
MCV: 94 fl (ref 78.0–100.0)
Platelets: 216 10*3/uL (ref 150.0–400.0)
RBC: 4.48 Mil/uL (ref 3.87–5.11)
RDW: 12.8 % (ref 11.5–15.5)
WBC: 5.4 10*3/uL (ref 4.0–10.5)

## 2018-11-05 LAB — CHOLESTEROL, TOTAL: CHOLESTEROL: 223 mg/dL — AB (ref 0–200)

## 2018-11-05 LAB — HDL CHOLESTEROL: HDL: 51 mg/dL (ref >39.00)

## 2018-11-05 MED ORDER — AMLODIPINE BESYLATE 5 MG PO TABS
5.0000 mg | ORAL_TABLET | Freq: Every day | ORAL | 1 refills | Status: DC
Start: 2018-11-05 — End: 2019-01-11

## 2018-11-05 NOTE — Patient Instructions (Signed)
BEFORE YOU LEAVE: -labs -xray -follow up: 1 month  ADD Norvasc (amlodipine) 5 mg and take once daily in the morning for blood pressure. Continue the hydrochlorothiazide.  Quit smoking.  Eat a healthy Mediterranean diet and get at least 150 minutes of aerobic exercise per week.  Complete the Cologuard test for colon cancer screening immediately.     We have ordered labs or studies at this visit. It can take up to 1-2 weeks for results and processing. IF results require follow up or explanation, we will call you with instructions. Clinically stable results will be released to your [facility]. If you have not heard from [place] or cannot find your results in [facility] in 2 weeks please contact our office at [phone].  If you are not yet signed up for [facility], please consider signing up.    We recommend the following healthy lifestyle for LIFE: 1) Small portions. But, make sure to get regular (at least 3 per day), healthy meals and small healthy snacks if needed.  2) Eat a healthy clean diet.   TRY TO EAT: -at least 5-7 servings of low sugar, colorful, and nutrient rich vegetables per day (not corn, potatoes or bananas.) -berries are the best choice if you wish to eat fruit (only eat small amounts if trying to reduce weight)  -lean meets (fish, white meat of chicken or [place]) -vegan proteins for some meals - beans or tofu, whole grains, nuts and seeds -Replace bad fats with good fats - good fats include: fish, nuts and seeds, canola oil, olive oil -small amounts of low fat or non fat dairy -small amounts of100 % whole grains - check the lables -drink plenty of water  AVOID: -SUGAR, sweets, anything with added sugar, corn syrup or sweeteners - must read labels as even foods advertised as "healthy" often are loaded with sugar -if you must have a sweetener, small amounts of stevia may be best -sweetened beverages and artificially sweetened beverages -simple starches (rice, bread,  potatoes, pasta, chips, etc - small amounts of 100% whole grains are ok) -red meat, pork, butter -fried foods, fast food, processed food, excessive dairy, eggs and coconut.  3)Get at least 150 minutes of sweaty aerobic exercise per week.  4)Reduce stress - consider counseling, meditation and relaxation to balance other aspects of your life.

## 2018-11-09 ENCOUNTER — Encounter: Payer: Self-pay | Admitting: Family Medicine

## 2018-11-09 NOTE — Addendum Note (Signed)
Addended by: Agnes Lawrence on: 11/09/2018 09:49 AM   Modules accepted: Orders

## 2018-11-12 ENCOUNTER — Encounter: Payer: 59 | Admitting: Obstetrics & Gynecology

## 2018-11-16 ENCOUNTER — Ambulatory Visit: Payer: 59 | Admitting: Psychology

## 2018-11-17 ENCOUNTER — Ambulatory Visit: Payer: 59 | Admitting: Psychology

## 2018-11-17 DIAGNOSIS — F411 Generalized anxiety disorder: Secondary | ICD-10-CM | POA: Diagnosis not present

## 2018-11-21 ENCOUNTER — Other Ambulatory Visit: Payer: Self-pay

## 2018-11-21 ENCOUNTER — Ambulatory Visit
Admission: EM | Admit: 2018-11-21 | Discharge: 2018-11-21 | Disposition: A | Discharge status: Home / Self Care | Payer: 59 | Attending: Family Medicine | Admitting: Family Medicine

## 2018-11-21 DIAGNOSIS — J209 Acute bronchitis, unspecified: Secondary | ICD-10-CM

## 2018-11-21 MED ORDER — IPRATROPIUM-ALBUTEROL 0.5-2.5 (3) MG/3ML IN SOLN
3.0000 mL | Freq: Once | RESPIRATORY_TRACT | Status: AC
  Administered 2018-11-21: 3 mL via RESPIRATORY_TRACT

## 2018-11-21 MED ORDER — CETIRIZINE HCL 10 MG PO CAPS: 10.0000 mg | ORAL_CAPSULE | Freq: Every day | ORAL | 0 refills | Status: DC

## 2018-11-21 MED ORDER — BENZONATATE 200 MG PO CAPS: 200.0000 mg | ORAL_CAPSULE | Freq: Three times a day (TID) | ORAL | 0 refills | Status: AC | PRN

## 2018-11-21 MED ORDER — METHYLPREDNISOLONE SODIUM SUCC 125 MG IJ SOLR
125.0000 mg | Freq: Once | INTRAMUSCULAR | Status: AC
Start: 2018-11-21 — End: 2018-11-21
  Administered 2018-11-21: 125 mg via INTRAMUSCULAR

## 2018-11-21 MED ORDER — ALBUTEROL SULFATE HFA 108 (90 BASE) MCG/ACT IN AERS: 1.0000 | INHALATION_SPRAY | Freq: Four times a day (QID) | RESPIRATORY_TRACT | 0 refills | Status: DC | PRN

## 2018-11-21 MED ORDER — PREDNISONE 50 MG PO TABS: 50.0000 mg | ORAL_TABLET | Freq: Every day | ORAL | 0 refills | Status: DC

## 2018-11-21 MED ORDER — AZITHROMYCIN 250 MG PO TABS: 250.0000 mg | ORAL_TABLET | Freq: Every day | ORAL | 0 refills | Status: DC

## 2018-11-21 MED ORDER — PREDNISONE 50 MG PO TABS: 50.0000 mg | ORAL_TABLET | Freq: Every day | ORAL | 0 refills | Status: AC

## 2018-11-21 NOTE — ED Triage Notes (Signed)
Per pt she has been having congested and chills with body aches since Thursday then the cough started yesterday with SOB and wheezing. Audible when talking with patient. Pt said she has has no appetite. No distress noted O2 at 94%

## 2018-11-21 NOTE — Discharge Instructions (Addendum)
Your symptoms and exam suggest that you have bronchitis We gave you 2 breathing treatments today Please continue to use albuterol inhaler 1 to 2 puffs as needed every 4-6 hours for shortness of breath, wheezing We gave you an injection of Solu-Medrol today, please continue with prednisone daily for the next 3 days beginning tomorrow; take with food Daily cetirizine to help with congestion and drainage Tessalon every 8 hours as needed for cough Begin taking azithromycin-2 tablets today, 1 tablet for the following 4 days  Please follow-up if symptoms not resolving, worsening, developing increased shortness of breath or difficulty breathing, fevers, chest discomfort, persistent jittery sensation

## 2018-11-21 NOTE — ED Provider Notes (Signed)
[facility]    CSN: [id number] Arrival date & time: 11/21/18  1334     History   Chief Complaint Chief Complaint  Patient presents with  . Cough  . Shortness of Breath    HPI Ariel [name] is a 58 y.o. female history of osteopenia, hyperlipidemia, tobacco use, hypertension presenting today for evaluation of cough and shortness of breath.  Patient states that on Thursday she started to develop some congestion and body aches, yesterday it turned into a cough and shortness of breath.  Overnight she has developed increased wheezing and difficulty breathing.  She has also had no appetite.  Has tried Alka-Seltzer cold and flu, Robitussin cough syrup without relief.  Denies any fevers.  Does endorse smoking approximately 5 cigarettes a day.  HPI  Past Medical History:  Diagnosis Date  . Chronic bronchitis ([place])   . Fibroids, intramural   . Goiter   . History of hypertension   . Hyperlipidemia   . Osteopenia 01/2018   T score -1.3 FRAX 3.8% / 0.5%  . Thyroid disease     Patient Active Problem List   Diagnosis Date Noted  . CTS (carpal tunnel syndrome) 01/22/2018  . Mood disorder ([place]) 01/22/2018  . Hypertension 05/29/2017  . Hyperlipidemia 05/29/2017  . Hyperglycemia 05/29/2017  . Tobacco use 05/29/2017  . Aortic atherosclerosis ([place]) 05/29/2017  . Goiter 04/24/2017    Past Surgical History:  Procedure Laterality Date  . ABDOMINAL HYSTERECTOMY     07/2017-Dr [place] per patient  . CARDIAC ELECTROPHYSIOLOGY STUDY AND ABLATION  2010   hx tachycardia   . LAPAROTOMY N/A 06/26/2017   Procedure: EXPLORATORY LAPAROTOMY;  Surgeon: Everitt Amber, MD;  Location: [facility];  Service: Gynecology;  Laterality: N/A;  . TONSILLECTOMY  1967  . TUBAL LIGATION     X2, REVERSAL     OB History    Gravida  2   Para  2   Term      Preterm      AB      Living  2     SAB      TAB      Ectopic      Multiple      Live Births               Home  Medications    Prior to Admission medications   Medication Sig Start Date End Date Taking? Authorizing Provider  albuterol (PROVENTIL HFA;VENTOLIN HFA) 108 (90 Base) MCG/ACT inhaler Inhale 1-2 puffs into the lungs every 6 (six) hours as needed for wheezing or shortness of breath. 11/21/18   Hazen Brumett C, PA-C  amLODipine (NORVASC) 5 MG tablet Take 1 tablet (5 mg total) by mouth daily. 11/05/18   Lucretia Kern, DO  aspirin EC 81 MG tablet Take 81 mg by mouth daily.    [provider]  azithromycin (ZITHROMAX) 250 MG tablet Take 1 tablet (250 mg total) by mouth daily. Take first 2 tablets together, then 1 every day until finished. 11/21/18   Dorie Ohms C, PA-C  benzonatate (TESSALON) 200 MG capsule Take 1 capsule (200 mg total) by mouth 3 (three) times daily as needed for up to 7 days for cough. 11/21/18 11/28/18  Lelani Garnett C, PA-C  Calcium Carbonate-Vit D-Min (CALCIUM 1200 PO) Take by mouth.    [provider]  Cetirizine HCl 10 MG CAPS Take 1 capsule (10 mg total) by mouth daily for 10 days. 11/21/18 12/01/18  Debara Pickett  C, PA-C  conjugated estrogens (PREMARIN) vaginal cream Place 2.70 Applicatorfuls vaginally 2 (two) times a week. 01/29/18   Princess Bruins, MD  hydrochlorothiazide (HYDRODIURIL) 25 MG tablet Take 1 tablet (25 mg total) by mouth daily. 08/06/18   Colin Benton R, DO  magnesium citrate solution Take 296 mLs by mouth as needed.    [provider]  Multiple Vitamin (MULTIVITAMIN) capsule Take 1 capsule by mouth daily. Centrum silver    [provider]  predniSONE (DELTASONE) 50 MG tablet Take 1 tablet (50 mg total) by mouth daily with breakfast for 3 days. 11/21/18 11/24/18  Salam Micucci C, PA-C  vitamin B-12 (CYANOCOBALAMIN) 500 MCG tablet Take 500 mcg by mouth daily.    [provider]    Family History Family History  Problem Relation Age of Onset  . Thyroid disease Maternal Aunt   . Hypertension Mother   . Renal Disease  Mother   . Diabetes Father   . Hypertension Father   . Renal Disease Father   . Stroke Maternal Grandmother   . Breast cancer Maternal Grandmother        diagnosed in her 47's  . Cancer Paternal Grandmother        ?    Social History Social History   Tobacco Use  . Smoking status: Current Every Day Smoker    Types: Cigarettes  . Smokeless tobacco: Never Used  . Tobacco comment: 4 CIGS A DAY  Substance Use Topics  . Alcohol use: Yes    Comment: OCC  . Drug use: No     Allergies   Patient has no known allergies.   Review of Systems Review of Systems  Constitutional: Negative for activity change, appetite change, chills, fatigue and fever.  HENT: Positive for congestion and rhinorrhea. Negative for ear pain, sinus pressure, sore throat and trouble swallowing.   Eyes: Negative for discharge and redness.  Respiratory: Positive for cough, chest tightness, shortness of breath and wheezing.   Cardiovascular: Negative for chest pain.  Gastrointestinal: Negative for abdominal pain, diarrhea, nausea and vomiting.  Musculoskeletal: Negative for myalgias.  Skin: Negative for rash.  Neurological: Negative for dizziness, light-headedness and headaches.     Physical Exam Triage Vital Signs ED Triage Vitals  Enc Vitals Group     BP 11/21/18 1351 132/84     Pulse Rate 11/21/18 1351 99     Resp --      Temp 11/21/18 1351 97.7 F (36.5 C)     Temp Source 11/21/18 1351 Oral     SpO2 11/21/18 1351 94 %     Weight 11/21/18 1352 154 lb (69.9 kg)     Height 11/21/18 1352 5\' 3"  (1.6 m)     Head Circumference --      Peak Flow --      Pain Score 11/21/18 1352 6     Pain Loc --      Pain Edu? --      Excl. in [place]? --    No data found.  Updated Vital Signs BP 132/84 (BP Location: Right Arm)   Pulse 99   Temp 97.7 F (36.5 C) (Oral)   Ht 5\' 3"  (1.6 m)   Wt 154 lb (69.9 kg)   LMP 06/29/2017   SpO2 94%   BMI 27.28 kg/m   Visual Acuity Right Eye Distance:   Left Eye  Distance:   Bilateral Distance:    Right Eye Near:   Left Eye Near:    Bilateral  Near:     Physical Exam Vitals signs and nursing note reviewed.  Constitutional:      General: She is not in acute distress.    Appearance: She is well-developed.  HENT:     Head: Normocephalic and atraumatic.     Ears:     Comments: Bilateral ears without tenderness to palpation of external auricle, tragus and mastoid, EAC's without erythema or swelling, TM's with good bony landmarks and cone of light. Non erythematous.    Mouth/Throat:     Comments: Oral mucosa pink and moist, no tonsillar enlargement or exudate. Posterior pharynx patent and nonerythematous, no uvula deviation or swelling. Normal phonation.  Eyes:     Conjunctiva/sclera: Conjunctivae normal.  Neck:     Musculoskeletal: Neck supple.  Cardiovascular:     Rate and Rhythm: Normal rate and regular rhythm.     Heart sounds: No murmur.  Pulmonary:     Effort: Pulmonary effort is normal. No respiratory distress.     Breath sounds: Wheezing present.     Comments: Patient with diffuse expiratory wheezing audible throughout all lung fields, audible prior to auscultation, slightly improved with breathing treatment Abdominal:     Palpations: Abdomen is soft.     Tenderness: There is no abdominal tenderness.  Skin:    General: Skin is warm and dry.  Neurological:     Mental Status: She is alert.      UC Treatments / Results  Labs (all labs ordered are listed, but only abnormal results are displayed) Labs Reviewed - No data to display  EKG None  Radiology No results found.  Procedures Procedures (including critical care time)  Medications Ordered in UC Medications  ipratropium-albuterol (DUONEB) 0.5-2.5 (3) MG/3ML nebulizer solution 3 mL (3 mLs Nebulization Given 11/21/18 1408)  ipratropium-albuterol (DUONEB) 0.5-2.5 (3) MG/3ML nebulizer solution 3 mL (3 mLs Nebulization Given 11/21/18 1431)  methylPREDNISolone sodium succinate  (SOLU-MEDROL) 125 mg/2 mL injection 125 mg (125 mg Intramuscular Given 11/21/18 1431)   Patient became slightly lightheaded, anxious and nervous after second DuoNeb, heart rate was approximately 112, patient was laid back and allowed to rest, heart rate decreased to approximately 92 with improvement of her symptoms over approximately 15 minutes.  Initial Impression / Assessment and Plan / UC Course  I have reviewed the triage vital signs and the nursing notes.  Pertinent labs & imaging results that were available during my care of the patient were reviewed by me and considered in my medical decision making (see chart for details).     Patient with bronchitis, will treat with continued albuterol use as needed, prednisone, Solu-Medrol prior to discharge.  Opted to go ahead and initiate azithromycin given increased sputum production in setting of likely underlying COPD given history of tobacco use.  Zyrtec and Tessalon for congestion, drainage and cough as needed.Discussed strict return precautions. Patient verbalized understanding and is agreeable with plan.  Final Clinical Impressions(s) / UC Diagnoses   Final diagnoses:  Acute bronchitis, unspecified organism     Discharge Instructions     Your symptoms and exam suggest that you have bronchitis We gave you 2 breathing treatments today Please continue to use albuterol inhaler 1 to 2 puffs as needed every 4-6 hours for shortness of breath, wheezing We gave you an injection of Solu-Medrol today, please continue with prednisone daily for the next 3 days beginning tomorrow; take with food Daily cetirizine to help with congestion and drainage Tessalon every 8 hours as needed for cough Begin taking  azithromycin-2 tablets today, 1 tablet for the following 4 days  Please follow-up if symptoms not resolving, worsening, developing increased shortness of breath or difficulty breathing, fevers, chest discomfort, persistent jittery sensation   ED  Prescriptions    Medication Sig Dispense Auth. Provider   azithromycin (ZITHROMAX) 250 MG tablet Take 1 tablet (250 mg total) by mouth daily. Take first 2 tablets together, then 1 every day until finished. 6 tablet Mathea Frieling C, PA-C   predniSONE (DELTASONE) 50 MG tablet  (Status: Discontinued) Take 1 tablet (50 mg total) by mouth daily with breakfast. 5 tablet Aster Eckrich C, PA-C   albuterol (PROVENTIL HFA;VENTOLIN HFA) 108 (90 Base) MCG/ACT inhaler Inhale 1-2 puffs into the lungs every 6 (six) hours as needed for wheezing or shortness of breath. 1 Inhaler Markeith Jue C, PA-C   benzonatate (TESSALON) 200 MG capsule Take 1 capsule (200 mg total) by mouth 3 (three) times daily as needed for up to 7 days for cough. 28 capsule Michi Herrmann C, PA-C   Cetirizine HCl 10 MG CAPS Take 1 capsule (10 mg total) by mouth daily for 10 days. 10 capsule Gaege Sangalang C, PA-C   predniSONE (DELTASONE) 50 MG tablet Take 1 tablet (50 mg total) by mouth daily with breakfast for 3 days. 3 tablet Bralynn Donado C, PA-C     Controlled Substance Prescriptions [place] Controlled Substance Registry consulted? Not Applicable   Janith Lima, [place] 11/21/18 1700

## 2018-12-01 ENCOUNTER — Ambulatory Visit: Payer: 59 | Admitting: Sports Medicine

## 2018-12-01 ENCOUNTER — Ambulatory Visit: Payer: Self-pay

## 2018-12-01 ENCOUNTER — Encounter: Payer: Self-pay | Admitting: Sports Medicine

## 2018-12-01 VITALS — BP 112/80 | HR 88 | Ht 63.0 in | Wt 157.0 lb

## 2018-12-01 DIAGNOSIS — G8929 Other chronic pain: Secondary | ICD-10-CM

## 2018-12-01 DIAGNOSIS — M25561 Pain in right knee: Secondary | ICD-10-CM | POA: Insufficient documentation

## 2018-12-01 NOTE — Procedures (Signed)
PROCEDURE NOTE:  Ultrasound Guided: Injection: Right knee Images were obtained and interpreted by myself, Teresa Coombs, DO  Images have been saved and stored to PACS system. Images obtained on: GE S7 Ultrasound machine    ULTRASOUND FINDINGS:  Small effusion.  Marked synovitis.  Bulging medial meniscus with a small to moderate osteophytic spur the tibial plateau of the medial joint line.  DESCRIPTION OF PROCEDURE:  The patient's clinical condition is marked by substantial pain and/or significant functional disability. Other conservative therapy has not provided relief, is contraindicated, or not appropriate. There is a reasonable likelihood that injection will significantly improve the patient's pain and/or functional impairment.   After discussing the risks, benefits and expected outcomes of the injection and all questions were reviewed and answered, the patient wished to undergo the above named procedure.  Verbal consent was obtained.  The ultrasound was used to identify the target structure and adjacent neurovascular structures. The skin was then prepped in sterile fashion and the target structure was injected under direct visualization using sterile technique as below:  Single injection performed as below: PREP: Alcohol and Ethel Chloride APPROACH:superiolateral, single injection, 25g 1.5 in. INJECTATE: 2 cc 0.5% Marcaine and 2 cc 40mg /mL DepoMedrol ASPIRATE: None DRESSING: Band-Aid  Post procedural instructions including recommending icing and warning signs for infection were reviewed.    This procedure was well tolerated and there were no complications.   IMPRESSION: Succesful Ultrasound Guided: Injection

## 2018-12-01 NOTE — Patient Instructions (Addendum)

## 2018-12-01 NOTE — Progress Notes (Signed)
Ariel [name]. [place], [place] at [facility] [place] - 58 y.o. female MRN [id number]  Date of birth: 1961/03/17  Visit Date: December 01, 2018  PCP: Lucretia Kern, DO   Referred by: Lucretia Kern, DO  SUBJECTIVE:  Chief Complaint  Patient presents with  . Right Knee - Initial Assessment    Sx x 2 weeks after hitting knee on car door. Feels like knee will give out. Some swelling. Denies n/t weakness. Worse with walking, knee flexion. XR R knee 11/05/18.   . Initial Assessment    Referred by Dr. Colin Benton.     HPI: Patient presents with right knee pain over the past 2 months.  Is worsened over the past 2 weeks.  She does report having clicking and locking as well as giving way.  She has had small but steady improvements in her pain but continues to have swelling.  Walking bending kneeling do seem to exacerbate her symptoms.  Rest elevation and walking with a straight leg gait are helpful.  REVIEW OF SYSTEMS: She has night sweats significant menopause no fevers chills.  No nighttime disturbances.  She did have one episode of slipping and landing on a car door but no other falls.  She has chronic bronchitis.  No significant lower extremity edema.  Otherwise 12 point review of systems was reviewed in detail negative  HISTORY:  Prior history reviewed and updated per electronic medical record.  Patient Active Problem List   Diagnosis Date Noted  . Right knee pain 12/01/2018    11/15/2018 XR R knee IMPRESSION: Mild osteoarthritis.  No acute bony abnormality.   . CTS (carpal tunnel syndrome) 01/22/2018  . Mood disorder ([place]) 01/22/2018  . Hypertension 05/29/2017  . Hyperlipidemia 05/29/2017  . Hyperglycemia 05/29/2017  . Tobacco use 05/29/2017  . Aortic atherosclerosis ([place]) 05/29/2017  . Goiter 04/24/2017   Social History   Occupational History  . Not on file  Tobacco Use  . Smoking status: Current Every Day Smoker      Types: Cigarettes  . Smokeless tobacco: Never Used  . Tobacco comment: 4 CIGS A DAY  Substance and Sexual Activity  . Alcohol use: Yes    Comment: OCC  . Drug use: No  . 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 Christian, no church currenty      Lifestyle: "poor diet"; no exercise   Past Medical History:  Diagnosis Date  . Chronic bronchitis ([place])   . Fibroids, intramural   . Goiter   . History of hypertension   . Hyperlipidemia   . Osteopenia 01/2018   T score -1.3 FRAX 3.8% / 0.5%  . Thyroid disease    Past Surgical History:  Procedure Laterality Date  . ABDOMINAL HYSTERECTOMY     07/2017-Dr [place] per patient  . CARDIAC ELECTROPHYSIOLOGY STUDY AND ABLATION  2010   hx tachycardia   . LAPAROTOMY N/A 06/26/2017   Procedure: EXPLORATORY LAPAROTOMY;  Surgeon: Everitt Amber, MD;  Location: [facility];  Service: Gynecology;  Laterality: N/A;  . TONSILLECTOMY  1967  . TUBAL LIGATION     X2, REVERSAL    family history includes  Breast cancer in her maternal grandmother; Cancer in her paternal grandmother; Diabetes in her father; Hypertension in her father and mother; Renal Disease in her father and mother; Stroke in her maternal grandmother; Thyroid disease in her maternal aunt.  OBJECTIVE:  VS:  HT:5\' 3"  (160 cm)   WT:157 lb (71.2 kg)  BMI:27.82    BP:112/80  HR:88bpm  TEMP: ( )  RESP:98 %   PHYSICAL EXAM: CONSTITUTIONAL: Well-developed, Well-nourished and In no acute distress EYES: Pupils are equal., EOM intact without nystagmus. and No scleral icterus. Psychiatric: Alert & appropriately interactive. and Not depressed or anxious appearing. EXTREMITY EXAM: Warm and well perfused  Right knee overall well aligned without significant  deformity.  She has moderate synovitis.  Ligamentously stable to varus and valgus strain.  Negative McMurray's.  No significant lower extremity edema.  Extensor mechanism intact.  Slight VMO atrophy on the right compared to left.  No significant Baker's cyst.   ASSESSMENT:   1. Chronic pain of right knee     PROCEDURES:  US Guided Injection per procedure note    PROCEDURE NOTE: THERAPEUTIC EXERCISES (19147)   Discussed the foundation of treatment for this condition is physical therapy and/or daily (5-6 days/week) therapeutic exercises, focusing on core strengthening, coordination, neuromuscular control/reeducation. 15 minutes spent for Therapeutic exercises as below and as referenced in the AVS. This included exercises focusing on stretching, strengthening, with significant focus on eccentric aspects.  Proper technique shown and discussed handout in great detail with ATC. All questions were discussed and answered.   Long term goals include an improvement in range of motion, strength, endurance as well as avoiding reinjury. Frequency of visits is one time as determined during today's office visit. Frequency of exercises to be performed is as per handout.  EXERCISES REVIEWED:  Hip ABduction strengthening with focus on Glute Medius Recruitment VMO Strengthening      PLAN:  Pertinent additional documentation may be included in corresponding procedure notes, imaging studies, problem based documentation and patient instructions.  Home Therapeutic exercises prescribed today per procedure note.  RICE (Rest, ICE, Compression, Elevation) principles reviewed with the patient.  Activity modifications and the importance of avoiding exacerbating activities (limiting pain to no more than a 4 / 10 during or following activity) recommended and discussed.   Discussed red flag symptoms that warrant earlier emergent evaluation and patient voices understanding.    No orders of the defined types were  placed in this encounter.  Lab Orders  No laboratory test(s) ordered today    Imaging Orders     [place] [facility] POCT ULTRASOUND Referral Orders  No referral(s) requested today    At follow up will plan to consider: advancing therapeutic exercises.  Can also consider providing a work accommodation for standing desk she does feel that this is worsening her overall general health and can contribute to worsening core strength.  Return in about 4 weeks (around 12/29/2018).          Gerda Diss, [place] [facility] Physician

## 2018-12-02 ENCOUNTER — Ambulatory Visit: Payer: 59 | Admitting: Psychology

## 2018-12-02 DIAGNOSIS — F411 Generalized anxiety disorder: Secondary | ICD-10-CM | POA: Diagnosis not present

## 2018-12-07 ENCOUNTER — Other Ambulatory Visit: Payer: Self-pay | Admitting: Family Medicine

## 2018-12-07 ENCOUNTER — Encounter: Payer: Self-pay | Admitting: Family Medicine

## 2018-12-07 MED ORDER — HYDROCHLOROTHIAZIDE 25 MG PO TABS: 25.0000 mg | ORAL_TABLET | Freq: Every day | ORAL | 1 refills | Status: DC

## 2018-12-08 NOTE — Progress Notes (Signed)
HPI:  Using dictation device. Unfortunately this device frequently misinterprets words/phrases.  Ariel [name] is a pleasant 58 yo here for follow up on elevated blood pressure, smoking and knee pain. Added norvasc, advised smoking cessation and healthy lifestyle and referred to sports medicine at her last visit.  Reports blood pressure has been great. Tolerating medication well. Knee doing better - had torn meniscus and had injection and is doing home exercises. Has been eating healthier and has cut back to 4 cigarettes per day. Unfortunately, she has not recovered from bronchitis. Started about 4 weeks ago with resp illness. Seen in [place] earlier this month and given steroids, inhaler and abx. No CXR. Reports much better, but still with occ wheezing/sob and she washed her inhaler so doe snot have it. Denies fever, thick sputum.  Seeing endo about her goiter, but she feels has enlarged and wants to see ENT as she feels causes swallowing issues at times. Reports mood much better and she really like Dennison Bulla whom she is seeing for CBT.  ROS: See pertinent positives and negatives per HPI.  Past Medical History:  Diagnosis Date  . Chronic bronchitis ([place])   . Fibroids, intramural   . Goiter   . History of hypertension   . Hyperlipidemia   . Osteopenia 01/2018   T score -1.3 FRAX 3.8% / 0.5%  . Thyroid disease     Past Surgical History:  Procedure Laterality Date  . ABDOMINAL HYSTERECTOMY     07/2017-Dr [facility] [facility] per patient  . CARDIAC ELECTROPHYSIOLOGY STUDY AND ABLATION  2010   hx tachycardia   . LAPAROTOMY N/A 06/26/2017   Procedure: EXPLORATORY LAPAROTOMY;  Surgeon: Everitt Amber, MD;  Location: [facility];  Service: Gynecology;  Laterality: N/A;  . TONSILLECTOMY  1967  . TUBAL LIGATION     X2, REVERSAL     Family History  Problem Relation Age of Onset  . Thyroid disease Maternal Aunt   . Hypertension Mother   . Renal Disease Mother   . Diabetes Father   . Hypertension  Father   . Renal Disease Father   . Stroke Maternal Grandmother   . Breast cancer Maternal Grandmother        diagnosed in her 60's  . Cancer Paternal Grandmother        ?    SOCIAL HX: se ehpi   Current Outpatient Medications:  .  amLODipine (NORVASC) 5 MG tablet, Take 1 tablet (5 mg total) by mouth daily., Disp: 30 tablet, Rfl: 1 .  aspirin EC 81 MG tablet, Take 81 mg by mouth daily., Disp: , Rfl:  .  conjugated estrogens (PREMARIN) vaginal cream, Place 0.16 Applicatorfuls vaginally 2 (two) times a week., Disp: 42.5 g, Rfl: 5 .  hydrochlorothiazide (HYDRODIURIL) 25 MG tablet, Take 1 tablet (25 mg total) by mouth daily., Disp: 90 tablet, Rfl: 1 .  magnesium citrate solution, Take 296 mLs by mouth as needed., Disp: , Rfl:  .  Multiple Vitamin (MULTIVITAMIN) capsule, Take 1 capsule by mouth daily. Centrum silver, Disp: , Rfl:  .  vitamin B-12 (CYANOCOBALAMIN) 500 MCG tablet, Take 500 mcg by mouth daily., Disp: , Rfl:  .  albuterol (PROVENTIL HFA;VENTOLIN HFA) 108 (90 Base) MCG/ACT inhaler, Inhale 1-2 puffs into the lungs every 6 (six) hours as needed for wheezing or shortness of breath., Disp: 1 Inhaler, Rfl: 0  EXAM:  Vitals:   12/10/18 1026  BP: 104/74  Pulse: 90  Temp: 98.3 F (36.8 C)  SpO2: 99%  Body mass index is 27.81 kg/m.  GENERAL: vitals reviewed and listed above, alert, oriented, appears well hydrated and in no acute distress  HEENT: atraumatic, conjunttiva clear, no obvious abnormalities on inspection of external nose and ears  NECK: no obvious masses on inspection  LUNGS: clear to auscultation bilaterally, no wheezes, rales or rhonchi, good air movement  CV: HRRR, no peripheral edema  MS: moves all extremities without noticeable abnormality  PSYCH: pleasant and cooperative, no obvious depression or anxiety  ASSESSMENT AND PLAN:  Discussed the following assessment and plan:  Bronchitis - Plan: DG Chest 2 View  SOB (shortness of breath) - Plan: DG  Chest 2 View  Tobacco use  Mood disorder (HCC)  Hyperglycemia  Hypertension, unspecified type  Aortic atherosclerosis (HCC)  Hyperlipidemia, unspecified hyperlipidemia type  -glad knee, BP and mood better -CXR -refill albuterol -advised smoking cessation and counseled, she agrees to try -referral to pulm if symptoms persist -lifetsyle recs -she plans to call ENT for eval and discuss goiter with endo as well at upcoming appt -follow up 3 months, sooner as needed -Patient advised to return or notify a doctor immediately if symptoms worsen or persist or new concerns arise.  Patient Instructions  BEFORE YOU LEAVE: -CXR -follow up: 3 months  Use the inhaler if feeling tight or wheezing per instructions.   Call the ear, nose and throat doctor for evaluation.  If persistent issues with bronchitis/breathing/wheezing and cxr ok would advise pulmonology evaluation - please call if symptoms persist despite cutting back/quitting smoking and albuterol as needed or if you are needing albuterol frequently and not improving.  We recommend the following healthy lifestyle for LIFE: 1) Small portions. But, make sure to get regular (at least 3 per day), healthy meals and small healthy snacks if needed.  2) Eat a healthy clean diet.   TRY TO EAT: -at least 5-7 servings of low sugar, colorful, and nutrient rich vegetables per day (not corn, potatoes or bananas.) -berries are the best choice if you wish to eat fruit (only eat small amounts if trying to reduce weight)  -lean meets (fish, white meat of chicken or [place]) -vegan proteins for some meals - beans or tofu, whole grains, nuts and seeds -Replace bad fats with good fats - good fats include: fish, nuts and seeds, canola oil, olive oil -small amounts of low fat or non fat dairy -small amounts of100 % whole grains - check the lables -drink plenty of water  AVOID: -SUGAR, sweets, anything with added sugar, corn syrup or sweeteners -  must read labels as even foods advertised as "healthy" often are loaded with sugar -if you must have a sweetener, small amounts of stevia may be best -sweetened beverages and artificially sweetened beverages -simple starches (rice, bread, potatoes, pasta, chips, etc - small amounts of 100% whole grains are ok) -red meat, pork, butter -fried foods, fast food, processed food, excessive dairy, eggs and coconut.  3)Get at least 150 minutes of sweaty aerobic exercise per week.  4)Reduce stress - consider counseling, meditation and relaxation to balance other aspects of your life.     Lucretia Kern, DO

## 2018-12-10 ENCOUNTER — Ambulatory Visit (INDEPENDENT_AMBULATORY_CARE_PROVIDER_SITE_OTHER): Payer: 59

## 2018-12-10 ENCOUNTER — Encounter: Payer: Self-pay | Admitting: Family Medicine

## 2018-12-10 ENCOUNTER — Ambulatory Visit: Payer: 59 | Admitting: Family Medicine

## 2018-12-10 VITALS — BP 104/74 | HR 90 | Temp 98.3°F | Ht 63.0 in | Wt 157.0 lb

## 2018-12-10 DIAGNOSIS — R0602 Shortness of breath: Secondary | ICD-10-CM

## 2018-12-10 DIAGNOSIS — F39 Unspecified mood [affective] disorder: Secondary | ICD-10-CM

## 2018-12-10 DIAGNOSIS — J40 Bronchitis, not specified as acute or chronic: Secondary | ICD-10-CM

## 2018-12-10 DIAGNOSIS — Z720 Tobacco use: Secondary | ICD-10-CM | POA: Diagnosis not present

## 2018-12-10 DIAGNOSIS — I10 Essential (primary) hypertension: Secondary | ICD-10-CM

## 2018-12-10 DIAGNOSIS — I700 Atherosclerosis of aorta: Secondary | ICD-10-CM

## 2018-12-10 DIAGNOSIS — E785 Hyperlipidemia, unspecified: Secondary | ICD-10-CM

## 2018-12-10 DIAGNOSIS — R739 Hyperglycemia, unspecified: Secondary | ICD-10-CM

## 2018-12-10 MED ORDER — ALBUTEROL SULFATE HFA 108 (90 BASE) MCG/ACT IN AERS: 1.0000 | INHALATION_SPRAY | Freq: Four times a day (QID) | RESPIRATORY_TRACT | 0 refills | Status: DC | PRN

## 2018-12-10 NOTE — Patient Instructions (Addendum)
BEFORE YOU LEAVE: -CXR -follow up: 3 months  Use the inhaler if feeling tight or wheezing per instructions.   Call the ear, nose and throat doctor for evaluation.  If persistent issues with bronchitis/breathing/wheezing and cxr ok would advise pulmonology evaluation - please call if symptoms persist despite cutting back/quitting smoking and albuterol as needed or if you are needing albuterol frequently and not improving.  We recommend the following healthy lifestyle for LIFE: 1) Small portions. But, make sure to get regular (at least 3 per day), healthy meals and small healthy snacks if needed.  2) Eat a healthy clean diet.   TRY TO EAT: -at least 5-7 servings of low sugar, colorful, and nutrient rich vegetables per day (not corn, potatoes or bananas.) -berries are the best choice if you wish to eat fruit (only eat small amounts if trying to reduce weight)  -lean meets (fish, white meat of chicken or [place]) -vegan proteins for some meals - beans or tofu, whole grains, nuts and seeds -Replace bad fats with good fats - good fats include: fish, nuts and seeds, canola oil, olive oil -small amounts of low fat or non fat dairy -small amounts of100 % whole grains - check the lables -drink plenty of water  AVOID: -SUGAR, sweets, anything with added sugar, corn syrup or sweeteners - must read labels as even foods advertised as "healthy" often are loaded with sugar -if you must have a sweetener, small amounts of stevia may be best -sweetened beverages and artificially sweetened beverages -simple starches (rice, bread, potatoes, pasta, chips, etc - small amounts of 100% whole grains are ok) -red meat, pork, butter -fried foods, fast food, processed food, excessive dairy, eggs and coconut.  3)Get at least 150 minutes of sweaty aerobic exercise per week.  4)Reduce stress - consider counseling, meditation and relaxation to balance other aspects of your life.

## 2018-12-24 ENCOUNTER — Ambulatory Visit: Payer: 59 | Admitting: Psychology

## 2018-12-29 ENCOUNTER — Encounter: Payer: Self-pay | Admitting: Sports Medicine

## 2018-12-29 ENCOUNTER — Ambulatory Visit: Payer: 59 | Admitting: Sports Medicine

## 2018-12-29 VITALS — BP 108/78 | HR 89 | Ht 63.0 in | Wt 155.6 lb

## 2018-12-29 DIAGNOSIS — M25561 Pain in right knee: Secondary | ICD-10-CM

## 2018-12-29 DIAGNOSIS — G8929 Other chronic pain: Secondary | ICD-10-CM

## 2018-12-29 DIAGNOSIS — M1711 Unilateral primary osteoarthritis, right knee: Secondary | ICD-10-CM

## 2018-12-29 NOTE — Progress Notes (Signed)
Ariel [name]. Ariel [name], [place] at [facility] [place] - 58 y.o. female MRN [id number]  Date of birth: 09-06-1961  Visit Date: 12/29/2018  PCP: Lucretia Kern, DO   Referred by: Lucretia Kern, DO  SUBJECTIVE:  Chief Complaint  Patient presents with  . Follow-up    R knee pain, mechanical symptoms and instability.  R knee injection - 12/01/18    HPI: Patient reports that she is continued to have some persistent mechanical symptoms especially with rotational activities.  She denies any significant swelling this time and has had good improvement with the intra-articular injection but incomplete relief.  REVIEW OF SYSTEMS: No significant nighttime awakenings due to this issue. Denies fevers, chills, recent weight gain or weight loss.  No night sweats.   HISTORY:  Prior history reviewed and updated per electronic medical record.  Patient Active Problem List   Diagnosis Date Noted  . Right knee pain 12/01/2018    11/15/2018 XR R knee IMPRESSION: Mild osteoarthritis.  No acute bony abnormality.   . CTS (carpal tunnel syndrome) 01/22/2018  . Mood disorder ([place]) 01/22/2018  . Hypertension 05/29/2017  . Hyperlipidemia 05/29/2017  . Hyperglycemia 05/29/2017  . Tobacco use 05/29/2017  . Aortic atherosclerosis ([place]) 05/29/2017  . Goiter 04/24/2017   Social History   Occupational History  . Not on file  Tobacco Use  . Smoking status: Current Every Day Smoker    Types: Cigarettes  . Smokeless tobacco: Never Used  . Tobacco comment: 4 CIGS A DAY  Substance and Sexual Activity  . Alcohol use: Yes    Comment: OCC  . Drug use: No  . 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 Christian, no church  currenty      Lifestyle: "poor diet"; no exercise    OBJECTIVE:  VS:  HT:5\' 3"  (160 cm)   WT:155 lb 9.6 oz (70.6 kg)  BMI:27.57    BP:108/78  HR:89bpm  TEMP: ( )  RESP:98 %   PHYSICAL EXAM: Adult female. No acute distress.  Alert and appropriate. Right knee is overall well aligned without significant effusion.  She is ligamentously stable.  She has some pain with McMurray's and a reproducible click along the medial joint line.   ASSESSMENT:   1. Chronic pain of right knee   2. Primary osteoarthritis of right knee     PROCEDURES:  None  PLAN:  Pertinent additional documentation may be included in corresponding procedure notes, imaging studies, problem based documentation and patient instructions.  No problem-specific Assessment & Plan notes found for this encounter.   We will go and get her preapproved for [place] on the possibility that this is purely coming from her underlying osteoarthritis that really is mild.  She does have mechanical symptoms suggesting more of a meniscal pathology and I like to set her up for an MRI as a suspect that this is an acute component of meniscal irritation in the setting of mild underlying osteoarthritis.  Continue previously prescribed home exercise program.   Activity modifications and the importance of avoiding exacerbating activities (limiting pain to no more than a 4 / 10 during or following activity) recommended and  discussed.   Discussed red flag symptoms that warrant earlier emergent evaluation and patient voices understanding.    No orders of the defined types were placed in this encounter.  Lab Orders  No laboratory test(s) ordered today    Imaging Orders     MR Knee Right Wo Contrast Referral Orders  No referral(s) requested today     Return for MRI results review.          Gerda Diss, [facility] Physician

## 2018-12-29 NOTE — Patient Instructions (Addendum)
We are ordering an MRI for you today.  The imaging office will be calling you to schedule your appointment after we obtain authorization from your insurance company.   Please be sure you have signed up for MyChart so that we can get your results to you.  We will be in touch with you as soon as we can.  Please know, it can take up to 3-4 business days for the radiologist and Dr. Paulla Fore to have time to review the results and determine the best appropriate action.  If there is something that appears to be surgical or needs a referral to other specialists we will let you know through [place] or telephone.  Please schedule a follow-up visit with Dr. Paulla Fore to review your MRI results for 2-3 days after your MRI.  [facility]: [phone]  Pennsaid instructions: You have been given a sample/prescription for Pennsaid, a topical medication.     You are to apply this gel to your injured body part twice daily (morning and evening).   A little goes a long way so you can use about a pea-sized amount for each area.   Spread this small amount over the area into a thin film and let it dry.   Be sure that you do not rub the gel into your skin for more than 10 or 15 seconds otherwise it can irritate you skin.    Once you apply the gel, please do not put any other lotion or clothing in contact with that area for 30 minutes to allow the gel to absorb into your skin.   Some people are sensitive to the medication and can develop a sunburn-like rash.  If you have only mild symptoms it is okay to continue to use the medication but if you have any breakdown of your skin you should discontinue its use and please let us know.   If you have been written a prescription for Pennsaid, you will receive a pump bottle of this topical gel through a mail order pharmacy.  The instructions on the bottle will say to apply two pumps twice a day which may be too much gel for your particular area so use the pea-sized amount as  your guide.   Instructions for [place]xis, Pennsaid and Vimovo:  Your prescription will be filled through a participating [facility] mail order pharmacy.  You will receive a phone call or text from one of the participating pharmacies which can be located in any state in the [place].  You must communicate directly with them to have this medication filled.  When the pharmacy contacts you, they will need your mailing address (for shipment of the medication) andy they will need payment information if you have a copay (typically no more than $10). If you have not heard from them 2-3 days after your appointment with Dr. Paulla Fore, contact [facility] directly at [phone].

## 2019-01-01 ENCOUNTER — Encounter: Payer: Self-pay | Admitting: Sports Medicine

## 2019-01-11 ENCOUNTER — Encounter: Payer: Self-pay | Admitting: Family Medicine

## 2019-01-11 MED ORDER — AMLODIPINE BESYLATE 5 MG PO TABS: 5.0000 mg | ORAL_TABLET | Freq: Every day | ORAL | 5 refills | Status: DC

## 2019-01-13 ENCOUNTER — Encounter: Payer: 59 | Admitting: Obstetrics & Gynecology

## 2019-01-20 ENCOUNTER — Ambulatory Visit: Payer: BLUE CROSS/BLUE SHIELD | Admitting: Endocrinology

## 2019-02-04 ENCOUNTER — Encounter: Payer: Self-pay | Admitting: Sports Medicine

## 2019-02-07 ENCOUNTER — Other Ambulatory Visit: Payer: Self-pay | Admitting: Obstetrics & Gynecology

## 2019-02-11 ENCOUNTER — Other Ambulatory Visit: Payer: Self-pay

## 2019-02-23 ENCOUNTER — Ambulatory Visit: Payer: 59 | Admitting: Endocrinology

## 2019-03-10 ENCOUNTER — Encounter: Payer: Self-pay | Admitting: Family Medicine

## 2019-03-11 ENCOUNTER — Other Ambulatory Visit: Payer: Self-pay | Admitting: Family Medicine

## 2019-03-11 ENCOUNTER — Ambulatory Visit: Payer: 59 | Admitting: Family Medicine

## 2019-03-11 MED ORDER — ALBUTEROL SULFATE HFA 108 (90 BASE) MCG/ACT IN AERS: 1.0000 | INHALATION_SPRAY | Freq: Four times a day (QID) | RESPIRATORY_TRACT | 0 refills | Status: DC | PRN

## 2019-03-11 NOTE — Progress Notes (Signed)
Virtual Visit via Video Note  I connected with Ariel [name]   on 03/12/19 at  9:00 AM EDT by a video enabled telemedicine application and verified that I am speaking with the correct person using two identifiers.  Location patient: home Location provider:work office Persons participating in the virtual visit: patient, provider  I discussed the limitations of evaluation and management by telemedicine and the availability of in person appointments. The patient expressed understanding and agreed to proceed.   Ariel [name] DOB: Jun 03, 1961 Encounter date: 03/12/2019  This is a 58 y.o. female who presents to establish care. Chief Complaint  Patient presents with  . Establish Care    transfer from Dr Maudie Mercury    History of present illness: Just trying to deal with this whole new normal. Working remotely since end of March. She works for [employer] in [employer] - higher stress job. Hasn't talked with keith since [place] virus started. States that she needs to but has had hard time due to schedule change - now she is working varied hours. Not sleeping well since all of this started. Did get melatonin but that isn't helping her. By mid day she just is tired. Bought highest dose she could at store. Took 2 of them but didn't seem to help. Interrupted sleep for no reason at all. Benadryl doesn't help, hot tea doesn't help. Has cut out caffeine. Taking hot bath/shower.   Following with ortho for right knee pain/torn meniscus. They are working with her to further evaluate but she has had to cancel some of follow up appointments due to her work schedule. Once schedule gets back on track she will follow up with this.   HTN: norvasc 5mg  daily, hctz 25mg : feels well.  HL: not taking medication for this. Diet has not been on track recently.  Aortic Atherosclerosis: noted on imaging; working to control bp and cholesterol.   CTS: This does still bother her. Does have braces. Not serious; it is  bearable.   Goiter: was planning to call ENT for eval/discuss with endo. Has referral info for ENT with [facility] [facility] to address. Hasn't called yet.    Past Medical History:  Diagnosis Date  . Chronic bronchitis ([place])   . Fibroids, intramural   . Goiter   . History of hypertension   . Hyperlipidemia   . Osteopenia 01/2018   T score -1.3 FRAX 3.8% / 0.5%  . Thyroid disease    Past Surgical History:  Procedure Laterality Date  . ABDOMINAL HYSTERECTOMY     07/2017-Dr [facility] [facility] per patient; done for fibroids  . CARDIAC ELECTROPHYSIOLOGY STUDY AND ABLATION  2010   hx tachycardia   . LAPAROTOMY N/A 06/26/2017   Procedure: EXPLORATORY LAPAROTOMY;  Surgeon: Everitt Amber, MD;  Location: [facility];  Service: Gynecology;  Laterality: N/A;  . TONSILLECTOMY  1967  . TUBAL LIGATION     X2, REVERSAL    No Known Allergies Current Meds  Medication Sig  . albuterol (VENTOLIN HFA) 108 (90 Base) MCG/ACT inhaler Inhale 1-2 puffs into the lungs every 6 (six) hours as needed for wheezing or shortness of breath.  Marland Kitchen amLODipine (NORVASC) 5 MG tablet Take 1 tablet (5 mg total) by mouth daily.  Marland Kitchen aspirin EC 81 MG tablet Take 81 mg by mouth daily.  Derrill Memo ON 03/15/2019] conjugated estrogens (PREMARIN) vaginal cream Place 5.00 Applicatorfuls vaginally 2 (two) times a week.  . hydrochlorothiazide (HYDRODIURIL) 25 MG tablet Take 1 tablet (25 mg total) by mouth daily.  Marland Kitchen  magnesium citrate solution Take 296 mLs by mouth as needed.  . Multiple Vitamin (MULTIVITAMIN) capsule Take 1 capsule by mouth daily. Centrum silver  . vitamin B-12 (CYANOCOBALAMIN) 500 MCG tablet Take 500 mcg by mouth daily.  . [DISCONTINUED] conjugated estrogens (PREMARIN) vaginal cream Place 8.92 Applicatorfuls vaginally 2 (two) times a week.   Social History   Tobacco Use  . Smoking status: Current Every Day Smoker    Types: Cigarettes  . Smokeless tobacco: Never Used  . Tobacco comment: 4 CIGS A DAY  Substance Use Topics  . Alcohol  use: Yes    Comment: OCC   Family History  Problem Relation Age of Onset  . Thyroid disease Maternal Aunt   . Hypertension Mother   . Renal Disease Mother   . Diabetes Father   . Hypertension Father   . Renal Disease Father        on dialysis  . Stroke Maternal Grandmother   . Breast cancer Maternal Grandmother        diagnosed in her 75's  . Cancer Paternal Grandmother        ?     Review of Systems  Constitutional: Negative for chills, fatigue and fever.  Respiratory: Negative for cough, chest tightness, shortness of breath and wheezing.   Cardiovascular: Negative for chest pain, palpitations and leg swelling.    Objective:  LMP 06/29/2017       BP Readings from Last 3 Encounters:  12/29/18 108/78  12/10/18 104/74  12/01/18 112/80   Wt Readings from Last 3 Encounters:  12/29/18 155 lb 9.6 oz (70.6 kg)  12/10/18 157 lb (71.2 kg)  12/01/18 157 lb (71.2 kg)    EXAM:  GENERAL: alert, oriented, sounds well and in no acute distress  LUNGS: no shortness of breath during conversation/discussion.   PSYCH/NEURO: pleasant and cooperative, no obvious depression or anxiety, speech and thought processing grossly intact    Assessment/Plan  1. Insomnia, unspecified type Trial trazodone; let me know if this is not helping.  - traZODone (DESYREL) 50 MG tablet; Take 0.5-1 tablets (25-50 mg total) by mouth at bedtime as needed for sleep.  Dispense: 30 tablet; Refill: 2  2. Vaginal dryness Needs to schedule f/u with obgyn. I have sent in 1 mo refill to tie her over until she can get in with them. - conjugated estrogens (PREMARIN) vaginal cream; Place 1.19 Applicatorfuls vaginally 2 (two) times a week.  Dispense: 42.5 g; Refill: 0  3. Screening for colon cancer Last test was not valid due to her waiting too long to complete (states she was told this by cologuard company); she would like to try and do this test again.  - Cologuard  4. Tobacco use Discussed in detail. She  is willing to try wellbutrin to aid with smoking cessation. Discussed cutting back to critical cigarettes during first week of single tab and then stopping with increase to BID dosing. Wait until after adjusted to trazodone to start. Consider CT lung cancer screening order at next visit.  5. Hyperlipidemia, unspecified hyperlipidemia type Diet controlled; will recheck at next appt in fall.  6. Thyroid nodule She has number for ENT and states she will call today for discussion/visit.  7. Hypertension, unspecified type Has been well controlled. Continue current medication.  8. Aortic atherosclerosis ([place]) Noted on CT in past; discussed smoking cessation along with control of bp and lipids.   9. Mood disorder ([place]) Mood has been stable, but some increased anxiety. She is going to  reach out to [employer] to schedule f/u.  Return in about 3 months (around 06/12/2019) for Chronic condition visit.    I discussed the assessment and treatment plan with the patient. The patient was provided an opportunity to ask questions and all were answered. The patient agreed with the plan and demonstrated an understanding of the instructions.   The patient was advised to call back or seek an in-person evaluation if the symptoms worsen or if the condition fails to improve as anticipated.  I provided 30 minutes of non-face-to-face time during this encounter.   Micheline Rough, MD

## 2019-03-12 ENCOUNTER — Ambulatory Visit (INDEPENDENT_AMBULATORY_CARE_PROVIDER_SITE_OTHER): Payer: 59 | Admitting: Family Medicine

## 2019-03-12 ENCOUNTER — Other Ambulatory Visit: Payer: Self-pay

## 2019-03-12 ENCOUNTER — Encounter: Payer: Self-pay | Admitting: Family Medicine

## 2019-03-12 DIAGNOSIS — Z1211 Encounter for screening for malignant neoplasm of colon: Secondary | ICD-10-CM

## 2019-03-12 DIAGNOSIS — I10 Essential (primary) hypertension: Secondary | ICD-10-CM

## 2019-03-12 DIAGNOSIS — N898 Other specified noninflammatory disorders of vagina: Secondary | ICD-10-CM

## 2019-03-12 DIAGNOSIS — I700 Atherosclerosis of aorta: Secondary | ICD-10-CM

## 2019-03-12 DIAGNOSIS — E785 Hyperlipidemia, unspecified: Secondary | ICD-10-CM

## 2019-03-12 DIAGNOSIS — Z720 Tobacco use: Secondary | ICD-10-CM | POA: Diagnosis not present

## 2019-03-12 DIAGNOSIS — G4700 Insomnia, unspecified: Secondary | ICD-10-CM | POA: Diagnosis not present

## 2019-03-12 DIAGNOSIS — E041 Nontoxic single thyroid nodule: Secondary | ICD-10-CM | POA: Insufficient documentation

## 2019-03-12 DIAGNOSIS — F39 Unspecified mood [affective] disorder: Secondary | ICD-10-CM

## 2019-03-12 MED ORDER — TRAZODONE HCL 50 MG PO TABS: 25.0000 mg | ORAL_TABLET | Freq: Every evening | ORAL | 2 refills | Status: DC | PRN

## 2019-03-12 MED ORDER — ESTROGENS, CONJUGATED 0.625 MG/GM VA CREA: 0.2500 | TOPICAL_CREAM | VAGINAL | 0 refills | Status: DC

## 2019-03-12 MED ORDER — BUPROPION HCL ER (SR) 150 MG PO TB12: ORAL_TABLET | ORAL | 2 refills | Status: DC

## 2019-03-16 ENCOUNTER — Telehealth: Payer: Self-pay | Admitting: *Deleted

## 2019-03-16 NOTE — Telephone Encounter (Signed)
-----   Message from Caren Macadam, MD sent at 03/12/2019  9:36 AM EDT ----- ccv 3 mo in office

## 2019-03-16 NOTE — Telephone Encounter (Signed)
I left a detailed message with the information below at the pts cell number asking that she call back for an appt.

## 2019-03-16 NOTE — Telephone Encounter (Signed)
-----   Message from Caren Macadam, MD sent at 03/12/2019 12:41 PM EDT ----- Forgot to tell you I ordered cologuard. She states that when she sent in last one it had been too long and she was told by company it would need to be redone because results weren't valid (preservative had expired or something)

## 2019-03-16 NOTE — Telephone Encounter (Signed)
I left a message for the pt to return my call. 

## 2019-03-25 ENCOUNTER — Other Ambulatory Visit: Payer: Self-pay

## 2019-03-25 ENCOUNTER — Ambulatory Visit (INDEPENDENT_AMBULATORY_CARE_PROVIDER_SITE_OTHER): Payer: 59 | Admitting: Endocrinology

## 2019-03-25 ENCOUNTER — Telehealth: Payer: Self-pay | Admitting: *Deleted

## 2019-03-25 DIAGNOSIS — E041 Nontoxic single thyroid nodule: Secondary | ICD-10-CM | POA: Diagnosis not present

## 2019-03-25 NOTE — Patient Instructions (Addendum)
Let's recheck the ultrasound.  you will receive a phone call, about a day and time for an appointment. Please see a surgery specialist.  you will receive a phone call, about a day and time for an appointment.

## 2019-03-25 NOTE — Telephone Encounter (Signed)
Patient called the pt and informed her of the message below.

## 2019-03-25 NOTE — Telephone Encounter (Signed)
-----   Message from Caren Macadam, MD sent at 03/12/2019 12:41 PM EDT ----- Forgot to tell you I ordered cologuard. She states that when she sent in last one it had been too long and she was told by company it would need to be redone because results weren't valid (preservative had expired or something)

## 2019-03-25 NOTE — Telephone Encounter (Signed)
Patient called and scheduled a follow up visit on 8/26.  She also wanted to let Dr Ethlyn Gallery know Wellbutrin made her too jittery and she wanted to know what else she could use?  Also stated the sleeping medication has helped a lot.  Patient wanted to let Dr Ethlyn Gallery know Dr Loanne Drilling has recommended and ordered an ultrasound due to the goiter and a referral to a surgeon pending the results.  Message forwarded to Dr Ethlyn Gallery.

## 2019-03-25 NOTE — Progress Notes (Signed)
Subjective:    Patient ID: Ariel [name], female    DOB: [birth year], 58 y.o.   MRN: [id number]  HPI telehealth visit today via phone x 8 minutes Alternatives to telehealth are presented to this patient, and the patient agrees to the telehealth visit. Pt is advised of the cost of the visit, and agrees to this, also.   Patient is at home, and I am at the office.   Persons attending the telehealth visit: the patient and I Pt return for f/u of multinodular goiter (dx'ed 2007, when pt lived in [place].  she has no h/o XRT or surgery to the neck; she was rx'ed synthroid, but has not recently taken; bx in 2018 showed BENIGN FOLLICULAR NODULE (BETHESDA CATEGORY II); f/u US in 2018 showed solitary approximately 4.8 cm nodule / mass nearly replacing the mid and inferior aspect the left lobe).  pt says moderate worsening of the swelling at the ant neck, and assoc dysphagia.   Past Medical History:  Diagnosis Date  . Chronic bronchitis ([place])   . Fibroids, intramural   . Goiter   . History of hypertension   . Hyperlipidemia   . Osteopenia 01/2018   T score -1.3 FRAX 3.8% / 0.5%  . Thyroid disease     Past Surgical History:  Procedure Laterality Date  . ABDOMINAL HYSTERECTOMY     07/2017-Dr [place] per patient; done for fibroids  . CARDIAC ELECTROPHYSIOLOGY STUDY AND ABLATION  2010   hx tachycardia   . LAPAROTOMY N/A 06/26/2017   Procedure: EXPLORATORY LAPAROTOMY;  Surgeon: Everitt Amber, MD;  Location: [facility];  Service: Gynecology;  Laterality: N/A;  . TONSILLECTOMY  1967  . TUBAL LIGATION     X2, REVERSAL     Social History   Socioeconomic History  . Marital status: Single    Spouse name: Not on file  . Number of children: Not on file  . Years of education: Not on file  . Highest education level: Not on file  Occupational History  . Not on file  Social Needs  . Financial resource strain: Not on file  . Food insecurity:    Worry: Not on file    Inability: Not on  file  . Transportation needs:    Medical: Not on file    Non-medical: Not on file  Tobacco Use  . Smoking status: Current Every Day Smoker    Types: Cigarettes  . Smokeless tobacco: Never Used  . Tobacco comment: 4 CIGS A DAY  Substance and Sexual Activity  . Alcohol use: Yes    Comment: OCC  . Drug use: No  . Sexual activity: Yes    Partners: Male    Comment: 1ST INTERCOURSE- 68, PARTNERS- 22, CURRENT PARTNER- 3.5 YRS   Lifestyle  . Physical activity:    Days per week: Not on file    Minutes per session: Not on file  . Stress: Not on file  Relationships  . Social connections:    Talks on phone: Not on file    Gets together: Not on file    Attends religious service: Not on file    Active member of club or organization: Not on file    Attends meetings of clubs or organizations: Not on file    Relationship status: Not on file  . Intimate partner violence:    Fear of current or ex partner: Not on file    Emotionally abused: Not on file    Physically abused: Not on  file    Forced sexual activity: Not on file  Other Topics Concern  . 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 Christian, no church currenty      Lifestyle: "poor diet"; no exercise    Current Outpatient Medications on File Prior to Visit  Medication Sig Dispense Refill  . albuterol (VENTOLIN HFA) 108 (90 Base) MCG/ACT inhaler Inhale 1-2 puffs into the lungs every 6 (six) hours as needed for wheezing or shortness of breath. 1 Inhaler 0  . amLODipine (NORVASC) 5 MG tablet Take 1 tablet (5 mg total) by mouth daily. 30 tablet 5  . aspirin EC 81 MG tablet Take 81 mg by mouth daily.    Marland Kitchen buPROPion (WELLBUTRIN SR) 150 MG 12 hr tablet Take 1 tablet daily x 7 days, then increase to twice daily. 60 tablet 2  . conjugated estrogens (PREMARIN) vaginal cream Place 1.61 Applicatorfuls vaginally 2 (two) times a week. 42.5  g 0  . hydrochlorothiazide (HYDRODIURIL) 25 MG tablet Take 1 tablet (25 mg total) by mouth daily. 90 tablet 1  . magnesium citrate solution Take 296 mLs by mouth as needed.    . Multiple Vitamin (MULTIVITAMIN) capsule Take 1 capsule by mouth daily. Centrum silver    . traZODone (DESYREL) 50 MG tablet Take 0.5-1 tablets (25-50 mg total) by mouth at bedtime as needed for sleep. 30 tablet 2  . vitamin B-12 (CYANOCOBALAMIN) 500 MCG tablet Take 500 mcg by mouth daily.     No current facility-administered medications on file prior to visit.     No Known Allergies  Family History  Problem Relation Age of Onset  . Thyroid disease Maternal Aunt   . Hypertension Mother   . Renal Disease Mother   . Diabetes Father   . Hypertension Father   . Renal Disease Father        on dialysis  . Stroke Maternal Grandmother   . Breast cancer Maternal Grandmother        diagnosed in her 51's  . Cancer Paternal Grandmother        ?    LMP 06/29/2017    Review of Systems Denies neck pain, but she has slight dyspnea--she takes albuterol MDI.      Objective:   Physical Exam    Lab Results  Component Value Date   TSH 1.46 01/28/2018       Assessment & Plan:  MNG: worse per pt.  Ref surg Sob: unlikely due to MNG: she declines spirometry Dysphagia: she declines Ba swallow.    Patient Instructions  Let's recheck the ultrasound.  you will receive a phone call, about a day and time for an appointment. Please see a surgery specialist.  you will receive a phone call, about a day and time for an appointment.

## 2019-03-26 NOTE — Telephone Encounter (Signed)
Thanks for the update.   -we were using wellbutrin for smoking cessation. Only other medication is chantix, but I believe she had some concerns with trying this. She could definitely work on cutting down using replacement methods (nicotine gum, patches). She can also try 1-800-QUIT-NOW which may be able to help with free patches/gum/advice.

## 2019-03-29 NOTE — Telephone Encounter (Signed)
I called the pt and informed her of the message below

## 2019-04-15 ENCOUNTER — Ambulatory Visit
Admission: RE | Admit: 2019-04-15 | Discharge: 2019-04-15 | Disposition: A | Discharge status: Home / Self Care | Payer: 59 | Source: Ambulatory Visit | Attending: Endocrinology | Admitting: Endocrinology

## 2019-04-15 DIAGNOSIS — E041 Nontoxic single thyroid nodule: Secondary | ICD-10-CM

## 2019-04-19 ENCOUNTER — Encounter: Payer: Self-pay | Admitting: Sports Medicine

## 2019-04-20 ENCOUNTER — Other Ambulatory Visit: Payer: 59

## 2019-04-29 ENCOUNTER — Ambulatory Visit: Payer: Self-pay | Admitting: Surgery

## 2019-04-29 NOTE — H&P (Signed)
General Surgery [facility]   [address] Documented: 04/29/2019 1:27 PM Location: [facility] Patient #: [id number] DOB: 1961-05-14 Undefined / Language: Ariel [name] / Race: Black or African American Female   History of Present Illness Ariel Regal MD; 04/29/2019 1:56 PM) The patient is a 57 year old female who presents with a complaint of Enlarged thyroid.  CHIEF COMPLAINT: left thyroid mass with compressive symptoms  Patient is referred by Dr. Renato Shin for surgical evaluation and management of a dominant mass involving the left thyroid lobe. Patient has long-standing thyroid disease dating back to 2007. The dominant nodule in the left lobe has been followed with sequential ultrasound scanning. It has continued to enlarge. She underwent percutaneous biopsy in 2018 showing a benign follicular nodule. Over the past several months the patient has developed progressive dyspnea with exertion, dysphagia, and frequent episodes of choking. She complains of pressure when she lies flat when she lifts her arms over her head. She has had no prior surgery on the neck. She did take Synthroid years ago for a brief period of time. She is currently not on thyroid hormone supplementation. Her most recent TSH level is normal at 1.46. Patient underwent ultrasound exam on April 15, 2019. This shows a normal right thyroid lobe without significant nodules. There is a dominant mass in the mid left thyroid lobe measuring 5.1 x 4.5 x 4.0 cm. This shows interval enlargement when compared to her study in 2018. This nodules felt to be mildly suspicious and biopsy has been recommended. Patient has a family history of Graves' disease and a maternal aunt. There is no family history of thyroid malignancy. There is no family history of other endocrine neoplasms. Patient denies any tremors or palpitations. She did have a cardiac ablation in the past for arrhythmia. She does not have  any current cardiac issues.   Past Surgical History Nance Pew, CMA; 04/29/2019 1:28 PM) Foot Surgery  Right. Tonsillectomy   Diagnostic Studies History Nance Pew, CMA; 04/29/2019 1:28 PM) Colonoscopy  never  Allergies (Sabrina Canty, CMA; 04/29/2019 1:28 PM) No Known Allergies  [04/29/2019]: No Known Drug Allergies  [04/29/2019]: Allergies Reconciled   Medication History Nance Pew, CMA; 04/29/2019 1:31 PM) Conjugated Linoleic Acid (600MG  Capsule, Oral) Active. buPROPion HCl (75MG  Tablet, Oral) Active. traZODone HCl (50MG  Tablet, Oral) Active. Albuterol Sulfate (1.25MG /3ML Nebulized Soln, Inhalation) Active. hydroCHLOROthiazide (25MG  Tablet, Oral) Active. Aspirin (81MG  Tablet DR, Oral) Active. Vitamin B-12 (1000MCG Tablet, Oral) Active. Multi-Day (Oral) Active. Medications Reconciled  Social History Gabriel Cirri Millbrook, CMA; 04/29/2019 1:28 PM) Alcohol use  Occasional alcohol use. Caffeine use  Carbonated beverages, Coffee, Tea. Illicit drug use  Remotely quit drug use. Tobacco use  Current every day smoker.  Family History Nance Pew, [place]; 04/29/2019 1:28 PM) Cancer  Family Members In General. Diabetes Mellitus  Father. Heart Disease  Father, Mother. Heart disease in female family member before age 43  Heart disease in female family member before age 56  Hypertension  Mother. Kidney Disease  Father, Mother. Respiratory Condition  Mother.  Other Problems Nance Pew, CMA; 04/29/2019 1:28 PM) Anxiety Disorder  High blood pressure  Hypercholesterolemia  Thyroid Disease     Review of Systems (Sabrina Canty CMA; 04/29/2019 1:28 PM) General Not Present- Appetite Loss, Chills, Fatigue, Fever, Night Sweats, Weight Gain and Weight Loss. Skin Not Present- Change in Wart/Mole, Dryness, Hives, Jaundice, New Lesions, Non-Healing Wounds, Rash and Ulcer. HEENT Present- Hoarseness and Wears glasses/contact lenses. Not Present- Earache, Hearing Loss,  Nose Bleed, Oral Ulcers, Ringing in the Ears, Seasonal Allergies, Sinus Pain, Sore Throat, Visual Disturbances and Yellow Eyes. Respiratory Not Present- Bloody sputum, Chronic Cough, Difficulty Breathing, Snoring and Wheezing. Breast Not Present- Breast Mass, Breast Pain, Nipple Discharge and Skin Changes. Cardiovascular Present- Shortness of Breath and Swelling of Extremities. Not Present- Chest Pain, Difficulty Breathing Lying Down, Leg Cramps, Palpitations and Rapid Heart Rate. Gastrointestinal Not Present- Abdominal Pain, Bloating, Bloody Stool, Change in Bowel Habits, Chronic diarrhea, Constipation, Difficulty Swallowing, Excessive gas, Gets full quickly at meals, Hemorrhoids, Indigestion, Nausea, Rectal Pain and Vomiting. Musculoskeletal Not Present- Back Pain, Joint Pain, Joint Stiffness, Muscle Pain, Muscle Weakness and Swelling of Extremities. Neurological Present- Decreased Memory. Not Present- Fainting, Headaches, Numbness, Seizures, Tingling, Tremor, Trouble walking and Weakness. Psychiatric Present- Anxiety and Change in Sleep Pattern. Not Present- Bipolar, Depression, Fearful and Frequent crying. Endocrine Present- Hot flashes. Not Present- Cold Intolerance, Excessive Hunger, Hair Changes, Heat Intolerance and New Diabetes. Hematology Not Present- Blood Thinners, Easy Bruising, Excessive bleeding, Gland problems, HIV and Persistent Infections.  Vitals (Sabrina Canty CMA; 04/29/2019 1:32 PM) 04/29/2019 1:31 PM Weight: 150.4 lb Height: 63in Body Surface Area: 1.71 m Body Mass Index: 26.64 kg/m  Temp.: 51F (Oral)  Pulse: 102 (Regular)  BP: 136/82(Sitting, Left Arm, Standard)       Physical Exam Ariel Regal MD; 04/29/2019 1:57 PM) The physical exam findings are as follows: Note: See vital signs recorded above  GENERAL APPEARANCE Development: normal Nutritional status: normal Gross deformities: none  SKIN Rash, lesions, ulcers: none Induration, erythema:  none Nodules: none palpable  EYES Conjunctiva and lids: normal Pupils: equal and reactive Iris: normal bilaterally  EARS, NOSE, MOUTH, THROAT External ears: no lesion or deformity External nose: no lesion or deformity Hearing: grossly normal Lips: no lesion or deformity Dentition: normal for age Oral mucosa: moist  NECK Symmetric: no Trachea: midline Thyroid: Right thyroid lobe is mildly enlarged but without palpable abnormality. There is slight tenderness. Left thyroid lobe is visibly enlarged. Mass measures approximately 5 cm and is centrally located. It is mobile with swallowing. It is mildly tender. There does not appear to be any associated lymphadenopathy.  CHEST Respiratory effort: normal Retraction or accessory muscle use: no Breath sounds: normal bilaterally Rales, rhonchi, wheeze: none  CARDIOVASCULAR Auscultation: regular rhythm, normal rate Murmurs: none Pulses: carotid and radial pulse 2+ palpable Lower extremity edema: Mild edema bilateral lower extremities, greater on the left than right Lower extremity varicosities: none  MUSCULOSKELETAL Station and gait: normal Digits and nails: no clubbing or cyanosis Muscle strength: grossly normal all extremities Range of motion: grossly normal all extremities Deformity: none  LYMPHATIC Cervical: none palpable Supraclavicular: none palpable  PSYCHIATRIC Oriented to person, place, and time: yes Mood and affect: normal for situation Judgment and insight: appropriate for situation    Assessment & Plan Ariel Regal MD; 04/29/2019 2:00 PM)  LEFT THYROID NODULE (E04.1)  Current Plans Pt Education - Pamphlet Given - The Thyroid Book: discussed with patient and provided information. The patient is referred by her endocrinologist for evaluation of left thyroid nodule causing compressive symptoms. She is provided with written literature on thyroid surgery to review at home.  Patient has a large mass in the left  thyroid lobe which is greater than 5 cm in diameter. This has gradually enlarged over many years. She has had previous fine-needle aspiration biopsy which was benign. She has now developed compressive symptoms and wishes surgical removal. We discussed the options for management. I have  recommended left thyroid lobectomy. We discussed the risk and benefits of surgery including the risk of recurrent laryngeal nerve injury and injury to parathyroid glands. We discussed the potential need for lifelong thyroid hormone supplementation. We discussed the potential need for additional surgery in the future to remove the right thyroid lobe in the event of malignancy. We discussed the hospital stay to be anticipated. We discussed the location and size of the surgical incision. We discussed her postoperative recovery and return to work. She understands and wishes to proceed in the near future.  The risks and benefits of the procedure have been discussed at length with the patient. The patient understands the proposed procedure, potential alternative treatments, and the course of recovery to be expected. All of the patient's questions have been answered at this time. The patient wishes to proceed with surgery.   Armandina Gemma, [facility] Office: [phone]

## 2019-05-10 ENCOUNTER — Other Ambulatory Visit: Payer: Self-pay | Admitting: Family Medicine

## 2019-05-10 ENCOUNTER — Encounter: Payer: Self-pay | Admitting: Family Medicine

## 2019-05-10 MED ORDER — HYDROCHLOROTHIAZIDE 25 MG PO TABS: 25.0000 mg | ORAL_TABLET | Freq: Every day | ORAL | 1 refills | Status: DC

## 2019-05-11 ENCOUNTER — Other Ambulatory Visit: Payer: Self-pay | Admitting: Family Medicine

## 2019-05-12 ENCOUNTER — Other Ambulatory Visit: Payer: Self-pay | Admitting: Family Medicine

## 2019-05-17 ENCOUNTER — Encounter: Payer: Self-pay | Admitting: Family Medicine

## 2019-05-17 ENCOUNTER — Other Ambulatory Visit: Payer: Self-pay | Admitting: Family Medicine

## 2019-05-28 ENCOUNTER — Encounter: Payer: Self-pay | Admitting: Family Medicine

## 2019-06-13 ENCOUNTER — Encounter (HOSPITAL_COMMUNITY): Payer: Self-pay | Admitting: Surgery

## 2019-06-13 NOTE — H&P (Signed)
General Surgery [facility]  Ariel [name] DOB: [birth year] Undefined / Language: Cleophus Molt / Race: Black or African American Female   History of Present Illness  The patient is a 58 year old female who presents with a complaint of Enlarged thyroid.  CHIEF COMPLAINT: left thyroid mass with compressive symptoms  Patient is referred by Dr. Renato Shin for surgical evaluation and management of a dominant mass involving the left thyroid lobe. Patient has long-standing thyroid disease dating back to 2007. The dominant nodule in the left lobe has been followed with sequential ultrasound scanning. It has continued to enlarge. She underwent percutaneous biopsy in 2018 showing a benign follicular nodule. Over the past several months the patient has developed progressive dyspnea with exertion, dysphagia, and frequent episodes of choking. She complains of pressure when she lies flat when she lifts her arms over her head. She has had no prior surgery on the neck. She did take Synthroid years ago for a brief period of time. She is currently not on thyroid hormone supplementation. Her most recent TSH level is normal at 1.46. Patient underwent ultrasound exam on April 15, 2019. This shows a normal right thyroid lobe without significant nodules. There is a dominant mass in the mid left thyroid lobe measuring 5.1 x 4.5 x 4.0 cm. This shows interval enlargement when compared to her study in 2018. This nodules felt to be mildly suspicious and biopsy has been recommended. Patient has a family history of Graves' disease and a maternal aunt. There is no family history of thyroid malignancy. There is no family history of other endocrine neoplasms. Patient denies any tremors or palpitations. She did have a cardiac ablation in the past for arrhythmia. She does not have any current cardiac issues.   Past Surgical History Foot Surgery  Right. Tonsillectomy   Diagnostic Studies  History Colonoscopy  never  Allergies No Known Allergies   Medication History Conjugated Linoleic Acid (600MG  Capsule, Oral) Active. buPROPion HCl (75MG  Tablet, Oral) Active. traZODone HCl (50MG  Tablet, Oral) Active. Albuterol Sulfate (1.25MG /3ML Nebulized Soln, Inhalation) Active. hydroCHLOROthiazide (25MG  Tablet, Oral) Active. Aspirin (81MG  Tablet DR, Oral) Active. Vitamin B-12 (1000MCG Tablet, Oral) Active. Multi-Day (Oral) Active. Medications Reconciled  Social History  Alcohol use  Occasional alcohol use. Caffeine use  Carbonated beverages, Coffee, Tea. Illicit drug use  Remotely quit drug use. Tobacco use  Current every day smoker.  Family History  Cancer  Family Members In General. Diabetes Mellitus  Father. Heart Disease  Father, Mother. Heart disease in female family member before age 41  Heart disease in female family member before age 64  Hypertension  Mother. Kidney Disease  Father, Mother. Respiratory Condition  Mother.  Other Problems Anxiety Disorder  High blood pressure  Hypercholesterolemia  Thyroid Disease   Review of Systems General Not Present- Appetite Loss, Chills, Fatigue, Fever, Night Sweats, Weight Gain and Weight Loss. Skin Not Present- Change in Wart/Mole, Dryness, Hives, Jaundice, New Lesions, Non-Healing Wounds, Rash and Ulcer. HEENT Present- Hoarseness and Wears glasses/contact lenses. Not Present- Earache, Hearing Loss, Nose Bleed, Oral Ulcers, Ringing in the Ears, Seasonal Allergies, Sinus Pain, Sore Throat, Visual Disturbances and Yellow Eyes. Respiratory Not Present- Bloody sputum, Chronic Cough, Difficulty Breathing, Snoring and Wheezing. Breast Not Present- Breast Mass, Breast Pain, Nipple Discharge and Skin Changes. Cardiovascular Present- Shortness of Breath and Swelling of Extremities. Not Present- Chest Pain, Difficulty Breathing Lying Down, Leg Cramps, Palpitations and Rapid Heart Rate. Gastrointestinal Not  Present- Abdominal Pain, Bloating, Bloody Stool, Change  in Bowel Habits, Chronic diarrhea, Constipation, Difficulty Swallowing, Excessive gas, Gets full quickly at meals, Hemorrhoids, Indigestion, Nausea, Rectal Pain and Vomiting. Musculoskeletal Not Present- Back Pain, Joint Pain, Joint Stiffness, Muscle Pain, Muscle Weakness and Swelling of Extremities. Neurological Present- Decreased Memory. Not Present- Fainting, Headaches, Numbness, Seizures, Tingling, Tremor, Trouble walking and Weakness. Psychiatric Present- Anxiety and Change in Sleep Pattern. Not Present- Bipolar, Depression, Fearful and Frequent crying. Endocrine Present- Hot flashes. Not Present- Cold Intolerance, Excessive Hunger, Hair Changes, Heat Intolerance and New Diabetes. Hematology Not Present- Blood Thinners, Easy Bruising, Excessive bleeding, Gland problems, HIV and Persistent Infections.  Vitals Weight: 150.4 lb Height: 63in Body Surface Area: 1.71 m Body Mass Index: 26.64 kg/m  Temp.: 28F (Oral)  Pulse: 102 (Regular)  BP: 136/82(Sitting, Left Arm, Standard)  Physical Exam  See vital signs recorded above  GENERAL APPEARANCE Development: normal Nutritional status: normal Gross deformities: none  SKIN Rash, lesions, ulcers: none Induration, erythema: none Nodules: none palpable  EYES Conjunctiva and lids: normal Pupils: equal and reactive Iris: normal bilaterally  EARS, NOSE, MOUTH, THROAT External ears: no lesion or deformity External nose: no lesion or deformity Hearing: grossly normal Lips: no lesion or deformity Dentition: normal for age Oral mucosa: moist  NECK Symmetric: no Trachea: midline Thyroid: Right thyroid lobe is mildly enlarged but without palpable abnormality. There is slight tenderness. Left thyroid lobe is visibly enlarged. Mass measures approximately 5 cm and is centrally located. It is mobile with swallowing. It is mildly tender. There does not appear to be any  associated lymphadenopathy.  CHEST Respiratory effort: normal Retraction or accessory muscle use: no Breath sounds: normal bilaterally Rales, rhonchi, wheeze: none  CARDIOVASCULAR Auscultation: regular rhythm, normal rate Murmurs: none Pulses: carotid and radial pulse 2+ palpable Lower extremity edema: Mild edema bilateral lower extremities, greater on the left than right Lower extremity varicosities: none  MUSCULOSKELETAL Station and gait: normal Digits and nails: no clubbing or cyanosis Muscle strength: grossly normal all extremities Range of motion: grossly normal all extremities Deformity: none  LYMPHATIC Cervical: none palpable Supraclavicular: none palpable  PSYCHIATRIC Oriented to person, place, and time: yes Mood and affect: normal for situation Judgment and insight: appropriate for situation    Assessment & Plan  LEFT THYROID NODULE (E04.1)   Pt Education - Pamphlet Given - The Thyroid Book: discussed with patient and provided information.  The patient is referred by her endocrinologist for evaluation of left thyroid nodule causing compressive symptoms. She is provided with written literature on thyroid surgery to review at home.  Patient has a large mass in the left thyroid lobe which is greater than 5 cm in diameter. This has gradually enlarged over many years. She has had previous fine-needle aspiration biopsy which was benign. She has now developed compressive symptoms and wishes surgical removal. We discussed the options for management. I have recommended left thyroid lobectomy. We discussed the risk and benefits of surgery including the risk of recurrent laryngeal nerve injury and injury to parathyroid glands. We discussed the potential need for lifelong thyroid hormone supplementation. We discussed the potential need for additional surgery in the future to remove the right thyroid lobe in the event of malignancy. We discussed the hospital stay to be  anticipated. We discussed the location and size of the surgical incision. We discussed her postoperative recovery and return to work. She understands and wishes to proceed in the near future.  The risks and benefits of the procedure have been discussed at length with the patient. The  patient understands the proposed procedure, potential alternative treatments, and the course of recovery to be expected. All of the patient's questions have been answered at this time. The patient wishes to proceed with surgery.   Armandina Gemma, [facility] Office: [phone]

## 2019-06-14 NOTE — Patient Instructions (Addendum)
YOU HAVE HAD A COVID 19 TEST ON 06/15/19.  PLEASE BEGIN THE QUARANTINE INSTRUCTIONS AS OUTLINED IN YOUR HANDOUT.                Ariel [name]  06/14/2019   Your procedure is scheduled on: 06-18-19    Report to [facility] Main  Entrance    Report to Admitting at 7:15 AM   1 VISITOR IS ALLOWED TO WAIT IN WAITING ROOM  ONLY DAY OF YOUR SURGERY. NO VISITORS ARE ALLOWED IN SHORT STAY OR RECOVERY ROOM.   Call this number if you have problems the morning of surgery [phone]    Remember: Do not eat food or drink liquids :After Midnight.      Take these medicines the morning of surgery with A SIP OF WATER: Amlodipine (Norvasc)  BRUSH YOUR TEETH MORNING OF SURGERY AND RINSE YOUR MOUTH OUT, NO CHEWING GUM CANDY OR MINTS.                                You may not have any metal on your body including hair pins and              piercings     Do not wear jewelry, make-up, lotions, powders or perfumes, deodorant              Do not wear nail polish.  Do not shave  48 hours prior to surgery.     Do not bring valuables to the hospital. [place].  Contacts, dentures or bridgework may not be worn into surgery.                  Please read over the following fact sheets you were given: _____________________________________________________________________             [facility] - Preparing for Surgery Before surgery, you can play an important role.  Because skin is not sterile, your skin needs to be as free of germs as possible.  You can reduce the number of germs on your skin by washing with CHG (chlorahexidine gluconate) soap before surgery.  CHG is an antiseptic cleaner which kills germs and bonds with the skin to continue killing germs even after washing. Please DO NOT use if you have an allergy to CHG or antibacterial soaps.  If your skin becomes reddened/irritated stop using the CHG and inform your nurse when you  arrive at Short Stay. Do not shave (including legs and underarms) for at least 48 hours prior to the first CHG shower.  You may shave your face/neck. Please follow these instructions carefully:  1.  Shower with CHG Soap the night before surgery and the  morning of Surgery.  2.  If you choose to wash your hair, wash your hair first as usual with your  normal  shampoo.  3.  After you shampoo, rinse your hair and body thoroughly to remove the  shampoo.                           4.  Use CHG as you would any other liquid soap.  You can apply chg directly  to the skin and wash  Gently with a scrungie or clean washcloth.  5.  Apply the CHG Soap to your body ONLY FROM THE NECK DOWN.   Do not use on face/ open                           Wound or open sores. Avoid contact with eyes, ears mouth and genitals (private parts).                       Wash face,  Genitals (private parts) with your normal soap.             6.  Wash thoroughly, paying special attention to the area where your surgery  will be performed.  7.  Thoroughly rinse your body with warm water from the neck down.  8.  DO NOT shower/wash with your normal soap after using and rinsing off  the CHG Soap.                9.  Pat yourself dry with a clean towel.            10.  Wear clean pajamas.            11.  Place clean sheets on your bed the night of your first shower and do not  sleep with pets. Day of Surgery : Do not apply any lotions/deodorants the morning of surgery.  Please wear clean clothes to the hospital/surgery center.  FAILURE TO FOLLOW THESE INSTRUCTIONS MAY RESULT IN THE CANCELLATION OF YOUR SURGERY PATIENT SIGNATURE_________________________________  NURSE SIGNATURE__________________________________  ________________________________________________________________________

## 2019-06-14 NOTE — Progress Notes (Addendum)
PCP - Micheline Rough, MD Cardiologist -   Chest x-ray - 12-10-18  EKG - At PAT appt 06-14-19 Stress Test -  ECHO -  Cardiac Cath -   Sleep Study -  CPAP -   Fasting Blood Sugar -  Checks Blood Sugar _____ times a day  Blood Thinner Instructions:  Aspirin Instructions: Hold 5 days prior, per Dr. Harlow Asa  Last Dose: 06-12-19  Anesthesia review:   Patient denies shortness of breath, fever, cough and chest pain at PAT appointment   Patient verbalized understanding of instructions that were given to them at the PAT appointment. Patient was also instructed that they will need to review over the PAT instructions again at home before surgery.

## 2019-06-15 ENCOUNTER — Encounter (HOSPITAL_COMMUNITY): Payer: Self-pay

## 2019-06-15 ENCOUNTER — Other Ambulatory Visit (HOSPITAL_COMMUNITY)
Admission: RE | Admit: 2019-06-15 | Discharge: 2019-06-15 | Disposition: A | Discharge status: Home / Self Care | Payer: 59 | Source: Ambulatory Visit | Attending: Surgery | Admitting: Surgery

## 2019-06-15 ENCOUNTER — Encounter (HOSPITAL_COMMUNITY)
Admission: RE | Admit: 2019-06-15 | Discharge: 2019-06-15 | Disposition: A | Discharge status: Home / Self Care | Payer: 59 | Source: Ambulatory Visit | Attending: Surgery | Admitting: Surgery

## 2019-06-15 ENCOUNTER — Other Ambulatory Visit: Payer: Self-pay

## 2019-06-15 DIAGNOSIS — I10 Essential (primary) hypertension: Secondary | ICD-10-CM | POA: Diagnosis not present

## 2019-06-15 DIAGNOSIS — Z01818 Encounter for other preprocedural examination: Secondary | ICD-10-CM | POA: Insufficient documentation

## 2019-06-15 DIAGNOSIS — E041 Nontoxic single thyroid nodule: Secondary | ICD-10-CM | POA: Insufficient documentation

## 2019-06-15 DIAGNOSIS — Z20828 Contact with and (suspected) exposure to other viral communicable diseases: Secondary | ICD-10-CM | POA: Diagnosis not present

## 2019-06-15 HISTORY — DX: Essential (primary) hypertension: I10

## 2019-06-15 HISTORY — DX: Dyspnea, unspecified: R06.00

## 2019-06-15 HISTORY — DX: Hypothyroidism, unspecified: E03.9

## 2019-06-15 LAB — CBC
HCT: 45.7 % (ref 36.0–46.0)
Hemoglobin: 14.8 g/dL (ref 12.0–15.0)
MCH: 31.6 pg (ref 26.0–34.0)
MCHC: 32.4 g/dL (ref 30.0–36.0)
MCV: 97.4 fL (ref 80.0–100.0)
Platelets: 226 10*3/uL (ref 150–400)
RBC: 4.69 MIL/uL (ref 3.87–5.11)
RDW: 12.6 % (ref 11.5–15.5)
WBC: 5.8 10*3/uL (ref 4.0–10.5)
nRBC: 0 % (ref 0.0–0.2)

## 2019-06-15 LAB — BASIC METABOLIC PANEL
Anion gap: 11 (ref 5–15)
BUN: 10 mg/dL (ref 6–20)
CO2: 28 mmol/L (ref 22–32)
Calcium: 10.3 mg/dL (ref 8.9–10.3)
Chloride: 102 mmol/L (ref 98–111)
Creatinine, Ser: 0.8 mg/dL (ref 0.44–1.00)
GFR calc Af Amer: >60 mL/min (ref >60)
GFR calc non Af Amer: >60 mL/min (ref >60)
Glucose, Bld: 92 mg/dL (ref 70–99)
Potassium: 3.9 mmol/L (ref 3.5–5.1)
Sodium: 141 mmol/L (ref 135–145)

## 2019-06-15 LAB — SARS CORONAVIRUS 2 (TAT 6-24 HRS): SARS Coronavirus 2: NEGATIVE

## 2019-06-16 ENCOUNTER — Ambulatory Visit (INDEPENDENT_AMBULATORY_CARE_PROVIDER_SITE_OTHER): Payer: 59 | Admitting: Family Medicine

## 2019-06-16 ENCOUNTER — Encounter: Payer: Self-pay | Admitting: Family Medicine

## 2019-06-16 VITALS — BP 132/90 | Wt 147.3 lb

## 2019-06-16 DIAGNOSIS — I10 Essential (primary) hypertension: Secondary | ICD-10-CM

## 2019-06-16 DIAGNOSIS — F419 Anxiety disorder, unspecified: Secondary | ICD-10-CM

## 2019-06-16 MED ORDER — SERTRALINE HCL 50 MG PO TABS: 50.0000 mg | ORAL_TABLET | Freq: Every day | ORAL | 2 refills | Status: DC

## 2019-06-16 NOTE — Progress Notes (Addendum)
Virtual Visit via Telephone Note  I connected with Ariel [name] 06/16/19 at 10:15 by telephone and verified that I am speaking with the correct person using two identifiers.   I discussed the limitations, risks, security and privacy concerns of performing an evaluation and management service by telephone and the availability of in person appointments. I also discussed with the patient that there may be a patient responsible charge related to this service. The patient expressed understanding and agreed to proceed.  Location patient: home Location provider: work office Participants present for the call: patient, provider Patient did not have a visit in the prior 7 days to address this/these issue(s).  Ariel [name] DOB: [birth year] Encounter date: 06/16/2019  This is a 58 y.o. female who presents with Chief Complaint  Patient presents with  . Follow-up    History of present illness: Has thyroid surgery on Friday.   Was doing better with sleeping, but they transitioned to platform upgrade at work which has her more stressed out. Because of this blood pressures is higher and stress level is higher. Uses the trazodone just if she cannot sleep. Felt like this transition was trigger.   Also hasn't done well with smoking (due to above). With surgery coming up she won't be able to smoke.   Did have bp checked yesterday and it was 132/90 at pre-surgical center.   July 30th went to donate blood and they told her she was anemic. Has increase iron in diet (beets); didn't let her donate.   She is constipated. Going 2-3 days without BM. Coffee in morning helps.   She has been losing weight. Appetite hasn't changed, but can tell weight has changed.   Stress and anxiety are issue. Feels irritable at work. Not feeling confident in herself. Feels like it is starting to get to her more and affect her on daily basis. A lot of stress revolves around work.    No Known Allergies Current Meds   Medication Sig  . albuterol (VENTOLIN HFA) 108 (90 Base) MCG/ACT inhaler INHALE 1 TO 2 PUFFS BY MOUTH EVERY 6 HOURS AS NEEDED FOR WHEEZING FOR SHORTNESS OF BREATH (Patient taking differently: Inhale 1-2 puffs into the lungs every 6 (six) hours as needed for wheezing or shortness of breath. )  . amLODipine (NORVASC) 5 MG tablet Take 1 tablet (5 mg total) by mouth daily.  Marland Kitchen aspirin EC 81 MG tablet Take 81 mg by mouth daily.  Marland Kitchen conjugated estrogens (PREMARIN) vaginal cream Place [id number] Applicatorfuls vaginally 2 (two) times a week.  . hydrochlorothiazide (HYDRODIURIL) 25 MG tablet Take 1 tablet (25 mg total) by mouth daily.  . magnesium citrate solution Take 296 mLs by mouth daily as needed (constipation).   . Multiple Vitamin (MULTIVITAMIN) capsule Take 1 capsule by mouth daily. Centrum silver  . traZODone (DESYREL) 50 MG tablet Take 0.5-1 tablets (25-50 mg total) by mouth at bedtime as needed for sleep.  . vitamin B-12 (CYANOCOBALAMIN) 500 MCG tablet Take 500 mcg by mouth daily.    Review of Systems  Constitutional: Negative for chills, fatigue and fever.  Respiratory: Negative for cough, chest tightness, shortness of breath and wheezing.   Cardiovascular: Negative for chest pain, palpitations and leg swelling.  Psychiatric/Behavioral: Positive for agitation (feels irritable/stressed), decreased concentration and sleep disturbance. Negative for suicidal ideas. The patient is nervous/anxious.     Objective:  BP 132/90 Comment: taken by pt-jaf  Wt 147 lb 4.8 oz (66.8 kg)   LMP 06/29/2017   BMI 26.09  kg/m   Weight: 147 lb 4.8 oz (66.8 kg)   BP Readings from Last 3 Encounters:  06/16/19 132/90  06/15/19 132/90  12/29/18 108/78   Wt Readings from Last 3 Encounters:  06/16/19 147 lb 4.8 oz (66.8 kg)  06/15/19 147 lb (66.7 kg)  12/29/18 155 lb 9.6 oz (70.6 kg)   Observations/Objective: Patient sounds cheerful and well on the phone. I do not appreciate any SOB. Speech and thought  processing are grossly intact. Attention is normal. Mood is somewhat anxious. Speech is normal. Behavior normal, cooperative. Thought content normal. No suicidal thoughts, plan. Cognition and judgment are normal.    Assessment/Plan  1. Anxiety We discussed options and she would like to start medication to help with stress level. Will schedule short term follow up in 2 weeks to touch base. Discussed new medication(s) today with patient. Discussed potential side effects and patient verbalized understanding. She is going to wait until after surgery to start.   - sertraline (ZOLOFT) 50 MG tablet; Take 1 tablet (50 mg total) by mouth daily.  Dispense: 30 tablet; Refill: 2  2. Hypertension, unspecified type Would like improved control of blood pressure. We discussed increasing amlodipine to 1.5 tablets daily. She is going to see how surgery goes and monitor at home. We will discuss again at follow up visit.   Return in about 2 weeks (around 06/30/2019) for Chronic condition visit.   I did not refer this patient for an OV in the next 24 hours for this/these issue(s).  I discussed the assessment and treatment plan with the patient. The patient was provided an opportunity to ask questions and all were answered. The patient agreed with the plan and demonstrated an understanding of the instructions.   The patient was advised to call back or seek an in-person evaluation if the symptoms worsen or if the condition fails to improve as anticipated.  I provided 25 minutes of non-face-to-face time during this encounter.  Micheline Rough, MD

## 2019-06-17 ENCOUNTER — Telehealth: Payer: Self-pay | Admitting: *Deleted

## 2019-06-17 NOTE — Telephone Encounter (Signed)
Appt

## 2019-06-17 NOTE — Telephone Encounter (Signed)
Appt scheduled for 9/21 at 8am.

## 2019-06-17 NOTE — Telephone Encounter (Signed)
-----   Message from Caren Macadam, MD sent at 06/16/2019 11:13 AM EDT ----- Please schedule follow in 2 weeks for mood/blood pressure

## 2019-06-18 ENCOUNTER — Encounter (HOSPITAL_COMMUNITY)
Admission: AD | Disposition: A | Discharge status: Home / Self Care | Payer: Self-pay | Source: Other Acute Inpatient Hospital | Attending: Surgery

## 2019-06-18 ENCOUNTER — Other Ambulatory Visit: Payer: Self-pay

## 2019-06-18 ENCOUNTER — Ambulatory Visit (HOSPITAL_COMMUNITY): Payer: 59 | Admitting: Certified Registered"

## 2019-06-18 ENCOUNTER — Telehealth: Payer: Self-pay | Admitting: *Deleted

## 2019-06-18 ENCOUNTER — Ambulatory Visit (HOSPITAL_COMMUNITY): Payer: 59 | Admitting: Physician Assistant

## 2019-06-18 ENCOUNTER — Inpatient Hospital Stay (HOSPITAL_COMMUNITY)
Admission: AD | Admit: 2019-06-18 | Discharge: 2019-06-20 | DRG: 627 | Disposition: A | Discharge status: Home / Self Care | Payer: 59 | Source: Other Acute Inpatient Hospital | Attending: Surgery | Admitting: Surgery

## 2019-06-18 ENCOUNTER — Encounter (HOSPITAL_COMMUNITY): Payer: Self-pay

## 2019-06-18 DIAGNOSIS — Z7982 Long term (current) use of aspirin: Secondary | ICD-10-CM

## 2019-06-18 DIAGNOSIS — E039 Hypothyroidism, unspecified: Secondary | ICD-10-CM | POA: Diagnosis present

## 2019-06-18 DIAGNOSIS — F419 Anxiety disorder, unspecified: Secondary | ICD-10-CM | POA: Diagnosis present

## 2019-06-18 DIAGNOSIS — F17200 Nicotine dependence, unspecified, uncomplicated: Secondary | ICD-10-CM | POA: Diagnosis present

## 2019-06-18 DIAGNOSIS — I10 Essential (primary) hypertension: Secondary | ICD-10-CM | POA: Diagnosis present

## 2019-06-18 DIAGNOSIS — R1310 Dysphagia, unspecified: Secondary | ICD-10-CM | POA: Diagnosis present

## 2019-06-18 DIAGNOSIS — E041 Nontoxic single thyroid nodule: Principal | ICD-10-CM | POA: Diagnosis present

## 2019-06-18 DIAGNOSIS — Z79899 Other long term (current) drug therapy: Secondary | ICD-10-CM

## 2019-06-18 DIAGNOSIS — E7800 Pure hypercholesterolemia, unspecified: Secondary | ICD-10-CM | POA: Diagnosis present

## 2019-06-18 HISTORY — PX: THYROID LOBECTOMY: SHX420

## 2019-06-18 SURGERY — LOBECTOMY, THYROID
Anesthesia: General | Site: Neck | Laterality: Left

## 2019-06-18 MED ORDER — MIDAZOLAM HCL 2 MG/2ML IJ SOLN
INTRAMUSCULAR | Status: DC | PRN
  Administered 2019-06-18: 2 mg via INTRAVENOUS

## 2019-06-18 MED ORDER — PROMETHAZINE HCL 25 MG/ML IJ SOLN: 6.2500 mg | INTRAMUSCULAR | Status: DC | PRN

## 2019-06-18 MED ORDER — KCL IN DEXTROSE-NACL 20-5-0.45 MEQ/L-%-% IV SOLN
INTRAVENOUS | Status: DC
  Administered 2019-06-18: 17:00:00 via INTRAVENOUS
  Filled 2019-06-18 (×2): qty 1000

## 2019-06-18 MED ORDER — PROMETHAZINE HCL 25 MG/ML IJ SOLN
INTRAMUSCULAR | Status: AC
  Filled 2019-06-18: qty 1

## 2019-06-18 MED ORDER — TRAZODONE HCL 50 MG PO TABS: 25.0000 mg | ORAL_TABLET | Freq: Every evening | ORAL | Status: DC | PRN

## 2019-06-18 MED ORDER — HYDROMORPHONE HCL 1 MG/ML IJ SOLN
INTRAMUSCULAR | Status: AC
  Administered 2019-06-18: 0.5 mg via INTRAVENOUS
  Filled 2019-06-18: qty 1

## 2019-06-18 MED ORDER — ONDANSETRON HCL 4 MG/2ML IJ SOLN
INTRAMUSCULAR | Status: AC
  Filled 2019-06-18: qty 2

## 2019-06-18 MED ORDER — HYDROCODONE-ACETAMINOPHEN 5-325 MG PO TABS
1.0000 | ORAL_TABLET | ORAL | Status: DC | PRN
  Administered 2019-06-18 (×2): 1 via ORAL
  Administered 2019-06-19 (×2): 2 via ORAL
  Filled 2019-06-18 (×2): qty 2

## 2019-06-18 MED ORDER — LACTATED RINGERS IV SOLN
INTRAVENOUS | Status: DC
  Administered 2019-06-18: 08:00:00 via INTRAVENOUS

## 2019-06-18 MED ORDER — ROCURONIUM BROMIDE 10 MG/ML (PF) SYRINGE
PREFILLED_SYRINGE | INTRAVENOUS | Status: DC | PRN
  Administered 2019-06-18: 70 mg via INTRAVENOUS

## 2019-06-18 MED ORDER — CHLORHEXIDINE GLUCONATE CLOTH 2 % EX PADS: 6.0000 | MEDICATED_PAD | Freq: Once | CUTANEOUS | Status: DC

## 2019-06-18 MED ORDER — HYDROMORPHONE HCL 1 MG/ML IJ SOLN
1.0000 mg | INTRAMUSCULAR | Status: DC | PRN
  Administered 2019-06-18 – 2019-06-19 (×4): 1 mg via INTRAVENOUS
  Filled 2019-06-18 (×5): qty 1

## 2019-06-18 MED ORDER — ACETAMINOPHEN 650 MG RE SUPP: 650.0000 mg | Freq: Four times a day (QID) | RECTAL | Status: DC | PRN

## 2019-06-18 MED ORDER — LIP MEDEX EX OINT
TOPICAL_OINTMENT | CUTANEOUS | Status: AC
  Filled 2019-06-18: qty 7

## 2019-06-18 MED ORDER — ONDANSETRON HCL 4 MG/2ML IJ SOLN
4.0000 mg | Freq: Four times a day (QID) | INTRAMUSCULAR | Status: DC | PRN
  Administered 2019-06-18 – 2019-06-19 (×2): 4 mg via INTRAVENOUS
  Filled 2019-06-18 (×2): qty 2

## 2019-06-18 MED ORDER — FENTANYL CITRATE (PF) 250 MCG/5ML IJ SOLN
INTRAMUSCULAR | Status: DC | PRN
  Administered 2019-06-18: 100 ug via INTRAVENOUS
  Administered 2019-06-18 (×2): 50 ug via INTRAVENOUS

## 2019-06-18 MED ORDER — DEXAMETHASONE SODIUM PHOSPHATE 10 MG/ML IJ SOLN
INTRAMUSCULAR | Status: AC
  Filled 2019-06-18: qty 1

## 2019-06-18 MED ORDER — LIDOCAINE 2% (20 MG/ML) 5 ML SYRINGE
INTRAMUSCULAR | Status: DC | PRN
  Administered 2019-06-18: 60 mg via INTRAVENOUS

## 2019-06-18 MED ORDER — ALBUTEROL SULFATE (2.5 MG/3ML) 0.083% IN NEBU: 2.5000 mg | INHALATION_SOLUTION | Freq: Four times a day (QID) | RESPIRATORY_TRACT | Status: DC | PRN

## 2019-06-18 MED ORDER — AMLODIPINE BESYLATE 5 MG PO TABS
5.0000 mg | ORAL_TABLET | Freq: Every day | ORAL | Status: DC
  Administered 2019-06-20: 5 mg via ORAL
  Filled 2019-06-18: qty 1

## 2019-06-18 MED ORDER — CEFAZOLIN SODIUM-DEXTROSE 2-4 GM/100ML-% IV SOLN
2.0000 g | INTRAVENOUS | Status: AC
  Administered 2019-06-18: 10:00:00 2 g via INTRAVENOUS
  Filled 2019-06-18: qty 100

## 2019-06-18 MED ORDER — HYDROCHLOROTHIAZIDE 25 MG PO TABS
25.0000 mg | ORAL_TABLET | Freq: Every day | ORAL | Status: DC
  Administered 2019-06-20: 25 mg via ORAL
  Filled 2019-06-18: qty 1

## 2019-06-18 MED ORDER — PROPOFOL 10 MG/ML IV BOLUS
INTRAVENOUS | Status: DC | PRN
  Administered 2019-06-18: 170 mg via INTRAVENOUS

## 2019-06-18 MED ORDER — ACETAMINOPHEN 325 MG PO TABS: 650.0000 mg | ORAL_TABLET | Freq: Four times a day (QID) | ORAL | Status: DC | PRN

## 2019-06-18 MED ORDER — PROPOFOL 10 MG/ML IV BOLUS
INTRAVENOUS | Status: AC
  Filled 2019-06-18: qty 20

## 2019-06-18 MED ORDER — ONDANSETRON 4 MG PO TBDP: 4.0000 mg | ORAL_TABLET | Freq: Four times a day (QID) | ORAL | Status: DC | PRN

## 2019-06-18 MED ORDER — SUGAMMADEX SODIUM 200 MG/2ML IV SOLN
INTRAVENOUS | Status: DC | PRN
  Administered 2019-06-18: 140 mg via INTRAVENOUS

## 2019-06-18 MED ORDER — ROCURONIUM BROMIDE 10 MG/ML (PF) SYRINGE
PREFILLED_SYRINGE | INTRAVENOUS | Status: AC
  Filled 2019-06-18: qty 10

## 2019-06-18 MED ORDER — 0.9 % SODIUM CHLORIDE (POUR BTL) OPTIME
TOPICAL | Status: DC | PRN
  Administered 2019-06-18: 1000 mL

## 2019-06-18 MED ORDER — ALBUTEROL SULFATE HFA 108 (90 BASE) MCG/ACT IN AERS: 1.0000 | INHALATION_SPRAY | Freq: Four times a day (QID) | RESPIRATORY_TRACT | Status: DC | PRN

## 2019-06-18 MED ORDER — SERTRALINE HCL 50 MG PO TABS
50.0000 mg | ORAL_TABLET | Freq: Every day | ORAL | Status: DC
  Administered 2019-06-20: 50 mg via ORAL
  Filled 2019-06-18: qty 1

## 2019-06-18 MED ORDER — LIDOCAINE 2% (20 MG/ML) 5 ML SYRINGE
INTRAMUSCULAR | Status: AC
  Filled 2019-06-18: qty 5

## 2019-06-18 MED ORDER — HYDROCODONE-ACETAMINOPHEN 5-325 MG PO TABS
ORAL_TABLET | ORAL | Status: AC
  Administered 2019-06-18: 1 via ORAL
  Filled 2019-06-18: qty 2

## 2019-06-18 MED ORDER — HYDROMORPHONE HCL 1 MG/ML IJ SOLN
0.2500 mg | INTRAMUSCULAR | Status: DC | PRN
  Administered 2019-06-18: 0.25 mg via INTRAVENOUS
  Administered 2019-06-18 (×3): 0.5 mg via INTRAVENOUS
  Administered 2019-06-18: 11:00:00 0.25 mg via INTRAVENOUS

## 2019-06-18 MED ORDER — EPHEDRINE SULFATE-NACL 50-0.9 MG/10ML-% IV SOSY
PREFILLED_SYRINGE | INTRAVENOUS | Status: DC | PRN
  Administered 2019-06-18: 5 mg via INTRAVENOUS
  Administered 2019-06-18: 10 mg via INTRAVENOUS

## 2019-06-18 MED ORDER — MEPERIDINE HCL 50 MG/ML IJ SOLN: 6.2500 mg | INTRAMUSCULAR | Status: DC | PRN

## 2019-06-18 MED ORDER — DEXAMETHASONE SODIUM PHOSPHATE 10 MG/ML IJ SOLN
INTRAMUSCULAR | Status: DC | PRN
  Administered 2019-06-18: 8 mg via INTRAVENOUS

## 2019-06-18 MED ORDER — HYDROMORPHONE HCL 1 MG/ML IJ SOLN
INTRAMUSCULAR | Status: AC
  Administered 2019-06-18: 0.25 mg via INTRAVENOUS
  Filled 2019-06-18: qty 1

## 2019-06-18 MED ORDER — TRAMADOL HCL 50 MG PO TABS: 50.0000 mg | ORAL_TABLET | Freq: Four times a day (QID) | ORAL | Status: DC | PRN

## 2019-06-18 MED ORDER — MIDAZOLAM HCL 2 MG/2ML IJ SOLN
INTRAMUSCULAR | Status: AC
  Filled 2019-06-18: qty 2

## 2019-06-18 MED ORDER — FENTANYL CITRATE (PF) 100 MCG/2ML IJ SOLN
INTRAMUSCULAR | Status: AC
  Filled 2019-06-18: qty 2

## 2019-06-18 MED ORDER — ONDANSETRON HCL 4 MG/2ML IJ SOLN
INTRAMUSCULAR | Status: DC | PRN
  Administered 2019-06-18: 4 mg via INTRAVENOUS

## 2019-06-18 MED ORDER — SUGAMMADEX SODIUM 200 MG/2ML IV SOLN
INTRAVENOUS | Status: AC
  Filled 2019-06-18: qty 2

## 2019-06-18 MED ORDER — KETOROLAC TROMETHAMINE 30 MG/ML IJ SOLN: 30.0000 mg | Freq: Once | INTRAMUSCULAR | Status: DC | PRN

## 2019-06-18 SURGICAL SUPPLY — 28 items
ATTRACTOMAT 16X20 MAGNETIC DRP (DRAPES) ×2 IMPLANT
BLADE SURG 15 STRL LF DISP TIS (BLADE) ×1 IMPLANT
BLADE SURG 15 STRL SS (BLADE) ×1
CHLORAPREP W/TINT 26 (MISCELLANEOUS) ×4 IMPLANT
CLIP VESOCCLUDE MED 6/CT (CLIP) ×4 IMPLANT
CLIP VESOCCLUDE SM WIDE 6/CT (CLIP) ×4 IMPLANT
COVER SURGICAL LIGHT HANDLE (MISCELLANEOUS) ×2 IMPLANT
COVER WAND RF STERILE (DRAPES) ×2 IMPLANT
DERMABOND ADVANCED (GAUZE/BANDAGES/DRESSINGS) ×1
DERMABOND ADVANCED .7 DNX12 (GAUZE/BANDAGES/DRESSINGS) IMPLANT
DRAPE LAPAROTOMY T 98X78 PEDS (DRAPES) ×2 IMPLANT
ELECT PENCIL ROCKER SW 15FT (MISCELLANEOUS) ×2 IMPLANT
ELECT REM PT RETURN 15FT ADLT (MISCELLANEOUS) ×2 IMPLANT
GAUZE 4X4 16PLY RFD (DISPOSABLE) ×2 IMPLANT
GLOVE SURG ORTHO 8.0 STRL STRW (GLOVE) ×2 IMPLANT
GOWN STRL REUS W/TWL XL LVL3 (GOWN DISPOSABLE) ×4 IMPLANT
HEMOSTAT SURGICEL 2X4 FIBR (HEMOSTASIS) IMPLANT
ILLUMINATOR WAVEGUIDE N/F (MISCELLANEOUS) IMPLANT
KIT BASIN OR (CUSTOM PROCEDURE TRAY) ×2 IMPLANT
KIT TURNOVER KIT A (KITS) IMPLANT
PACK BASIC VI WITH GOWN DISP (CUSTOM PROCEDURE TRAY) ×2 IMPLANT
SHEARS HARMONIC 9CM CVD (BLADE) ×2 IMPLANT
SUT MNCRL AB 4-0 PS2 18 (SUTURE) ×2 IMPLANT
SUT VIC AB 3-0 SH 18 (SUTURE) ×4 IMPLANT
SYR BULB IRRIGATION 50ML (SYRINGE) ×2 IMPLANT
TOWEL OR 17X26 10 PK STRL BLUE (TOWEL DISPOSABLE) ×2 IMPLANT
TOWEL OR NON WOVEN STRL DISP B (DISPOSABLE) ×2 IMPLANT
TUBING CONNECTING 10 (TUBING) ×2 IMPLANT

## 2019-06-18 NOTE — Anesthesia Preprocedure Evaluation (Signed)
Anesthesia Evaluation  Patient identified by MRN, date of birth, ID band Patient awake    Reviewed: Allergy & Precautions, NPO status , Patient's Chart, lab work & pertinent test results  Airway Mallampati: II  TM Distance: >3 FB Neck ROM: Full    Dental  (+) Dental Advisory Given, Partial Upper   Pulmonary neg pulmonary ROS, Current Smoker and Patient abstained from smoking.,    Pulmonary exam normal        Cardiovascular hypertension, + Peripheral Vascular Disease   Rhythm:Regular Rate:Normal     Neuro/Psych negative neurological ROS     GI/Hepatic negative GI ROS, Neg liver ROS,   Endo/Other  Hypothyroidism   Renal/GU negative Renal ROS     Musculoskeletal negative musculoskeletal ROS (+)   Abdominal Normal abdominal exam  (+)   Peds  Hematology negative hematology ROS (+)   Anesthesia Other Findings - HLD  Reproductive/Obstetrics                             Lab Results  Component Value Date   WBC 5.8 06/15/2019   HGB 14.8 06/15/2019   HCT 45.7 06/15/2019   MCV 97.4 06/15/2019   PLT 226 06/15/2019   Lab Results  Component Value Date   CREATININE 0.80 06/15/2019   BUN 10 06/15/2019   NA 141 06/15/2019   K 3.9 06/15/2019   CL 102 06/15/2019   CO2 28 06/15/2019   No results found for: INR, PROTIME   EKG: normal sinus rhythm.  Anesthesia Physical  Anesthesia Plan  ASA: II  Anesthesia Plan: General   Post-op Pain Management:    Induction: Intravenous  PONV Risk Score and Plan: 3 and Ondansetron, Dexamethasone and Midazolam  Airway Management Planned: Oral ETT  Additional Equipment: None  Intra-op Plan:   Post-operative Plan: Extubation in OR  Informed Consent: I have reviewed the patients History and Physical, chart, labs and discussed the procedure including the risks, benefits and alternatives for the proposed anesthesia with the patient or authorized  representative who has indicated his/her understanding and acceptance.     Dental advisory given  Plan Discussed with: CRNA  Anesthesia Plan Comments:         Anesthesia Quick Evaluation

## 2019-06-18 NOTE — Discharge Instructions (Signed)
[facility]  THYROID & PARATHYROID SURGERY:  POST-OP INSTRUCTIONS  Always review your discharge instruction sheet from the facility where your surgery was performed.  A prescription for pain medication may be given to you upon discharge.  Take your pain medication as prescribed.  If narcotic pain medicine is not needed, then you may take acetaminophen (Tylenol) or ibuprofen (Advil) as needed.  Take your usually prescribed medications unless otherwise directed.  If you need a refill on your pain medication, please contact our office during regular business hours.  Prescriptions cannot be processed by our office after 5 pm or on weekends.  Start with a light diet upon arrival home, such as soup and crackers or toast.  Be sure to drink plenty of fluids daily.  Resume your normal diet the day after surgery.  Most patients will experience some swelling and bruising on the chest and neck area.  Ice packs will help.  Swelling and bruising can take several days to resolve.   It is common to experience some constipation after surgery.  Increasing fluid intake and taking a stool softener (Colace) will usually help or prevent this problem.  A mild laxative (Milk of Magnesia or Miralax) should be taken according to package directions if there has been no bowel movement after 48 hours.  You have steri-strips and a gauze dressing over your incision.  You may remove the gauze bandage on the second day after surgery, and you may shower at that time.  Leave your steri-strips (small skin tapes) in place directly over the incision.  These strips should remain on the skin for 5-7 days and then be removed.  You may get them wet in the shower and pat them dry.  You may resume regular (light) daily activities beginning the next day (such as daily self-care, walking, climbing stairs) gradually increasing activities as tolerated.  You may have sexual intercourse when it is comfortable.  Refrain from  any heavy lifting or straining until approved by your doctor.  You may drive when you no longer are taking prescription pain medication, you can comfortably wear a seatbelt, and you can safely maneuver your car and apply brakes.  You should see your doctor in the office for a follow-up appointment approximately three weeks after your surgery.  Make sure that you call for this appointment within a day or two after you arrive home to insure a convenient appointment time.  WHEN TO CALL YOUR DOCTOR: -- Fever greater than 101.5 -- Inability to urinate -- Nausea and/or vomiting - persistent -- Extreme swelling or bruising -- Continued bleeding from incision -- Increased pain, redness, or drainage from the incision -- Difficulty swallowing or breathing -- Muscle cramping or spasms -- Numbness or tingling in hands or around lips  The clinic staff is available to answer your questions during regular business hours.  Please don't hesitate to call and ask to speak to one of the nurses if you have concerns.  Adger Cantera, MD [facility] Office: [phone] 

## 2019-06-18 NOTE — Anesthesia Procedure Notes (Signed)
Procedure Name: Intubation Date/Time: 06/18/2019 9:26 AM Performed by: Eben Burow, CRNA Pre-anesthesia Checklist: Patient identified, Emergency Drugs available, Suction available, Patient being monitored and Timeout performed Patient Re-evaluated:Patient Re-evaluated prior to induction Oxygen Delivery Method: Circle system utilized Preoxygenation: Pre-oxygenation with 100% oxygen Induction Type: IV induction Ventilation: Mask ventilation without difficulty Laryngoscope Size: Mac and 4 Grade View: Grade I Tube type: Oral Tube size: 7.0 mm Number of attempts: 1 Airway Equipment and Method: Stylet Placement Confirmation: ETT inserted through vocal cords under direct vision,  positive ETCO2 and breath sounds checked- equal and bilateral Secured at: 22 cm Tube secured with: Tape Dental Injury: Teeth and Oropharynx as per pre-operative assessment

## 2019-06-18 NOTE — Transfer of Care (Signed)
Immediate Anesthesia Transfer of Care Note  Patient: TANAISA JARRARD  Procedure(s) Performed: LEFT THYROID LOBECTOMY (Left Neck)  Patient Location: PACU  Anesthesia Type:General  Level of Consciousness: awake, alert  and oriented  Airway & Oxygen Therapy: Patient Spontanous Breathing and Patient connected to face mask oxygen  Post-op Assessment: Report given to RN and Post -op Vital signs reviewed and stable  Post vital signs: Reviewed and stable  Last Vitals:  Vitals Value Taken Time  BP 136/96 06/18/19 1045  Temp    Pulse 92 06/18/19 1047  Resp 11 06/18/19 1047  SpO2 100 % 06/18/19 1047  Vitals shown include unvalidated device data.  Last Pain:  Vitals:   06/18/19 0733  TempSrc:   PainSc: 0-No pain         Complications: No apparent anesthesia complications

## 2019-06-18 NOTE — Telephone Encounter (Signed)
-----   Message from Caren Macadam, MD sent at 06/17/2019  9:23 PM EDT ----- She never heard about cologuard. Could you look into this and re-order if needed please?

## 2019-06-18 NOTE — Telephone Encounter (Signed)
Per Sharyn Lull at [employer] they do not have an order from Dr Ethlyn Gallery.  New order completed and faxed along with demographics and insurance to [phone].

## 2019-06-18 NOTE — Interval H&P Note (Signed)
History and Physical Interval Note:  06/18/2019 8:52 AM  Ariel [name]  has presented today for surgery, with the diagnosis of left thyroid nodule.  The various methods of treatment have been discussed with the patient and family. After consideration of risks, benefits and other options for treatment, the patient has consented to    Procedure(s): LEFT THYROID LOBECTOMY (Left) as a surgical intervention.    The patient's history has been reviewed, patient examined, no change in status, stable for surgery.  I have reviewed the patient's chart and labs.  Questions were answered to the patient's satisfaction.    Armandina Gemma, [facility] Office: [place]

## 2019-06-18 NOTE — Op Note (Signed)
Procedure Note  Pre-operative Diagnosis:  Left thyroid nodule  Post-operative Diagnosis:  same  Surgeon:  Armandina Gemma, MD  Assistant:  Jilda Roche   Procedure:  Left thyroid lobectomy  Anesthesia:  General  Estimated Blood Loss:  minimal  Drains: none         Specimen: thyroid lobe to pathology  Indications:  Patient is referred by Dr. Renato Shin for surgical evaluation and management of a dominant mass involving the left thyroid lobe. Patient has long-standing thyroid disease dating back to 2007. The dominant nodule in the left lobe has been followed with sequential ultrasound scanning. It has continued to enlarge. She underwent percutaneous biopsy in 2018 showing a benign follicular nodule. Over the past several months the patient has developed progressive dyspnea with exertion, dysphagia, and frequent episodes of choking. She complains of pressure when she lies flat when she lifts her arms over her head. She has had no prior surgery on the neck. She did take Synthroid years ago for a brief period of time. She is currently not on thyroid hormone supplementation. Her most recent TSH level is normal at 1.46. Patient underwent ultrasound exam on April 15, 2019. This shows a normal right thyroid lobe without significant nodules. There is a dominant mass in the mid left thyroid lobe measuring 5.1 x 4.5 x 4.0 cm. This shows interval enlargement when compared to her study in 2018. This nodules felt to be mildly suspicious and biopsy has been recommended. Patient now comes to surgery for resection of the left thyroid lobe.  Procedure Details: Procedure was done in OR #11 at the [facility]. The patient was brought to the operating room and placed in a supine position on the operating room table. Following administration of general anesthesia, the patient was positioned and then prepped and draped in the usual aseptic fashion. After ascertaining that an adequate level of  anesthesia had been achieved, a small Kocher incision was made with #15 blade. Dissection was carried through subcutaneous tissues and platysma. Hemostasis was achieved with the electrocautery. Skin flaps were elevated cephalad and caudad from the thyroid notch to the sternal notch. A self-retaining retractor was placed for exposure. Strap muscles were incised in the midline and dissection was begun on the left side. Strap muscles were reflected laterally. The left thyroid lobe was markedly enlarge with a dominant nodule replacing most of the thyroid lobe. The lobe was gently mobilized with blunt dissection. Superior pole vessels were dissected out and divided individually between small and medium ligaclips with the harmonic scalpel. The thyroid lobe was rolled anteriorly. Branches of the inferior thyroid artery were divided between small ligaclips with the harmonic scalpel. Inferior venous tributaries were divided between ligaclips. Both the superior and inferior parathyroid glands were identified and preserved on their vascular pedicles. The recurrent laryngeal nerve was identified and preserved along its course. The ligament of Gwenlyn Found was released with the electrocautery and the gland was mobilized onto the anterior trachea. Isthmus was mobilized across the midline. There was no pyramidal lobe present. The thyroid parenchyma was transected at the junction of the isthmus and contralateral thyroid lobe with the harmonic scalpel. The thyroid lobe and isthmus were submitted to pathology for review.  The neck was irrigated with warm saline. Fibrillar was placed throughout the operative field. Strap muscles were approximated in the midline with interrupted 3-0 Vicryl sutures. Platysma was closed with interrupted 3-0 Vicryl sutures. Skin was closed with a running 4-0 Monocryl subcuticular suture.  Wound was washed  and dried and Dermabond was applied. The patient was awakened from anesthesia and brought to the recovery  room. The patient tolerated the procedure well.   Armandina Gemma, MD [facility] Office: [phone]

## 2019-06-18 NOTE — Anesthesia Postprocedure Evaluation (Signed)
Anesthesia Post Note  Patient: ROSHONDA [name]  Procedure(s) Performed: LEFT THYROID LOBECTOMY (Left Neck)     Patient location during evaluation: PACU Anesthesia Type: General Level of consciousness: awake and sedated Pain management: pain level controlled Vital Signs Assessment: post-procedure vital signs reviewed and stable Respiratory status: spontaneous breathing Cardiovascular status: stable Postop Assessment: no apparent nausea or vomiting Anesthetic complications: no    Last Vitals:  Vitals:   06/18/19 1145 06/18/19 1200  BP: 116/77 114/78  Pulse: 69 65  Resp: 12 12  Temp:  (!) 36.4 C  SpO2: 97% 96%    Last Pain:  Vitals:   06/18/19 1200  TempSrc:   PainSc: Asleep   Pain Goal:                   Huston Foley

## 2019-06-19 ENCOUNTER — Encounter (HOSPITAL_COMMUNITY): Payer: Self-pay | Admitting: Surgery

## 2019-06-19 DIAGNOSIS — I10 Essential (primary) hypertension: Secondary | ICD-10-CM | POA: Diagnosis present

## 2019-06-19 DIAGNOSIS — F419 Anxiety disorder, unspecified: Secondary | ICD-10-CM | POA: Diagnosis present

## 2019-06-19 DIAGNOSIS — E041 Nontoxic single thyroid nodule: Secondary | ICD-10-CM | POA: Diagnosis present

## 2019-06-19 DIAGNOSIS — F17200 Nicotine dependence, unspecified, uncomplicated: Secondary | ICD-10-CM | POA: Diagnosis present

## 2019-06-19 DIAGNOSIS — R1310 Dysphagia, unspecified: Secondary | ICD-10-CM | POA: Diagnosis present

## 2019-06-19 DIAGNOSIS — E7800 Pure hypercholesterolemia, unspecified: Secondary | ICD-10-CM | POA: Diagnosis present

## 2019-06-19 DIAGNOSIS — Z79899 Other long term (current) drug therapy: Secondary | ICD-10-CM | POA: Diagnosis not present

## 2019-06-19 DIAGNOSIS — E039 Hypothyroidism, unspecified: Secondary | ICD-10-CM | POA: Diagnosis present

## 2019-06-19 DIAGNOSIS — Z7982 Long term (current) use of aspirin: Secondary | ICD-10-CM | POA: Diagnosis not present

## 2019-06-19 NOTE — Progress Notes (Signed)
Subjective 2 episodes of emesis last night - she believes this was more medication related/anesthesia. Doing better this morning - denies n/v. Neck sore. Has had some difficulty swallowing solid food and medication tablets. Denies choking sensation and has been tolerating liquids and oatmeal without issue. Denies SOB  Objective: Vital signs in last 24 hours: Temp:  [97.5 F (36.4 C)-98.4 F (36.9 C)] 97.9 F (36.6 C) (08/29 0943) Pulse Rate:  [62-96] 81 (08/29 0943) Resp:  [12-18] 14 (08/29 0943) BP: (103-156)/(61-96) 103/73 (08/29 0943) SpO2:  [91 %-100 %] 98 % (08/29 0943) Last BM Date: 06/17/19  Intake/Output from previous day: 08/28 0701 - 08/29 0700 In: 1782.9 [P.O.:240; I.V.:1442.9; IV Piggyback:100] Out: 110 [Urine:100; Blood:10] Intake/Output this shift: Total I/O In: -  Out: 625 [Urine:625]  Gen: NAD, comfortable; sitting up in bed watching tv. Voice soft; no stridor. Neck: Incision c/d without redness; neck is soft, no significant swelling nor hematoma CV: RRR Pulm: Normal work of breathing; lungs ctab Ext: SCDs in place  Lab Results: CBC  No results for input(s): WBC, HGB, HCT, PLT in the last 72 hours. BMET No results for input(s): NA, K, CL, CO2, GLUCOSE, BUN, CREATININE, CALCIUM in the last 72 hours. PT/INR No results for input(s): LABPROT, INR in the last 72 hours. ABG No results for input(s): PHART, HCO3 in the last 72 hours.  Invalid input(s): PCO2, PO2  Studies/Results:  Anti-infectives: Anti-infectives (From admission, onward)   Start     Dose/Rate Route Frequency Ordered Stop   06/18/19 0730  ceFAZolin (ANCEF) IVPB 2g/100 mL premix     2 g 200 mL/hr over 30 Minutes Intravenous On call to O.R. 06/18/19 0716 06/18/19 0930       Assessment/Plan: Patient Active Problem List   Diagnosis Date Noted  . Left thyroid nodule 03/12/2019  . Right knee pain 12/01/2018  . CTS (carpal tunnel syndrome) 01/22/2018  . Mood disorder ([place]) 01/22/2018  .  Hypertension 05/29/2017  . Hyperlipidemia 05/29/2017  . Hyperglycemia 05/29/2017  . Tobacco use 05/29/2017  . Aortic atherosclerosis ([place]) 05/29/2017  . Goiter 04/24/2017   s/p Procedure(s): LEFT THYROID LOBECTOMY 06/18/2019  -Changed to soft diet today for hopes of better tolerance at her request -No signs of significant swelling on exam, dysphagia should improve with time -Will keep today and monitor, recheck tomorrow and decide on disposition at that time   LOS: 0 days   Sharon Mt. Dema Severin, M.D. [facility]

## 2019-06-20 MED ORDER — TRAMADOL HCL 50 MG PO TABS: 50.0000 mg | ORAL_TABLET | Freq: Four times a day (QID) | ORAL | 0 refills | Status: DC | PRN

## 2019-06-20 NOTE — Progress Notes (Signed)
Writer gave discharge instructions to patient. Patient had no questions. NT or writer will wheel patient out once her family comes to pick her up

## 2019-06-20 NOTE — Discharge Summary (Signed)
Patient ID: Ariel [name] MRN: [id number] DOB/AGE: [birth year] 58 y.o.  Admit date: 06/18/2019 Discharge date: 06/20/2019  Discharge Diagnoses Patient Active Problem List   Diagnosis Date Noted  . Left thyroid nodule 03/12/2019  . Right knee pain 12/01/2018  . CTS (carpal tunnel syndrome) 01/22/2018  . Mood disorder ([place]) 01/22/2018  . Hypertension 05/29/2017  . Hyperlipidemia 05/29/2017  . Hyperglycemia 05/29/2017  . Tobacco use 05/29/2017  . Aortic atherosclerosis ([place]) 05/29/2017  . Goiter 04/24/2017    Consultants None  Procedures Left thyroid lobectomy 06/18/2019  Hospital Course: She was taken to OR 8/28 for the above procedure. She recovered postoperatively on the ward. On POD#1 she had some subjective dysphagia and concerns around tolerating intake and medications. She was doing well otherwise. She was kept for trial of diet and PO medications which she did well with. On POD#2, she was tolerating a diet, pain well controlled and able to take her tablet medications  NAD, comfortable, sitting up in bed watching TV RRR Lungs ctab; no stridor or wheezing Neck is soft, mild swelling around incision but not tense. Incision c/d/i without erythema or drainage.    Allergies as of 06/20/2019   No Known Allergies     Medication List    TAKE these medications   albuterol 108 (90 Base) MCG/ACT inhaler Commonly known as: VENTOLIN HFA INHALE 1 TO 2 PUFFS BY MOUTH EVERY 6 HOURS AS NEEDED FOR WHEEZING FOR SHORTNESS OF BREATH What changed: See the new instructions.   amLODipine 5 MG tablet Commonly known as: NORVASC Take 1 tablet (5 mg total) by mouth daily.   aspirin EC 81 MG tablet Take 81 mg by mouth daily.   conjugated estrogens vaginal cream Commonly known as: Premarin Place [id number] Applicatorfuls vaginally 2 (two) times a week.   hydrochlorothiazide 25 MG tablet Commonly known as: HYDRODIURIL Take 1 tablet (25 mg total) by mouth daily.   magnesium citrate  solution Take 296 mLs by mouth daily as needed (constipation).   multivitamin capsule Take 1 capsule by mouth daily. Centrum silver   sertraline 50 MG tablet Commonly known as: ZOLOFT Take 1 tablet (50 mg total) by mouth daily.   traMADol 50 MG tablet Commonly known as: Ultram Take 1 tablet (50 mg total) by mouth every 6 (six) hours as needed.   traZODone 50 MG tablet Commonly known as: DESYREL Take 0.5-1 tablets (25-50 mg total) by mouth at bedtime as needed for sleep.   vitamin B-12 500 MCG tablet Commonly known as: CYANOCOBALAMIN Take 500 mcg by mouth daily.       Follow-up Information    Armandina Gemma, MD. Schedule an appointment as soon as possible for a visit in 3 weeks.   Specialty: General Surgery Why: For wound re-check Contact information: [address] [place] [address] [phone]           Derik Fults M. Dema Severin, M.D. [facility]

## 2019-06-29 LAB — COLOGUARD: Cologuard: NEGATIVE

## 2019-07-06 ENCOUNTER — Encounter: Payer: Self-pay | Admitting: Family Medicine

## 2019-07-12 ENCOUNTER — Encounter: Payer: Self-pay | Admitting: Family Medicine

## 2019-07-12 ENCOUNTER — Other Ambulatory Visit: Payer: Self-pay

## 2019-07-12 ENCOUNTER — Ambulatory Visit: Payer: 59 | Admitting: Family Medicine

## 2019-07-12 VITALS — BP 102/70 | HR 72 | Temp 97.3°F | Ht 63.0 in | Wt 144.9 lb

## 2019-07-12 DIAGNOSIS — E785 Hyperlipidemia, unspecified: Secondary | ICD-10-CM | POA: Diagnosis not present

## 2019-07-12 DIAGNOSIS — R739 Hyperglycemia, unspecified: Secondary | ICD-10-CM

## 2019-07-12 DIAGNOSIS — I10 Essential (primary) hypertension: Secondary | ICD-10-CM

## 2019-07-12 DIAGNOSIS — Z1239 Encounter for other screening for malignant neoplasm of breast: Secondary | ICD-10-CM

## 2019-07-12 DIAGNOSIS — G8929 Other chronic pain: Secondary | ICD-10-CM

## 2019-07-12 DIAGNOSIS — M25561 Pain in right knee: Secondary | ICD-10-CM

## 2019-07-12 MED ORDER — AMLODIPINE BESYLATE 5 MG PO TABS: 5.0000 mg | ORAL_TABLET | Freq: Every day | ORAL | 1 refills | Status: DC

## 2019-07-12 NOTE — Progress Notes (Signed)
Ariel [name] DOB: 1961/03/27 Encounter date: 07/12/2019  This is a 58 y.o. female who presents with Chief Complaint  Patient presents with  . Follow-up    History of present illness: -voice does fade as day progresses.   HTN: on amlodipine, hctz but not checking at home. No problems with medication.   zoloft does make her drowsy; so not taking it every day; but doing it every other day. Felt like she was nodding off when she was taking it. Hasn't tried taking it in evening. Last week was first week back at work and it worked really well for her. Felt calm, not overly anxious. Doesn't need the trazodone every night. Just taking if needed. Actually used the trazodone instead of pain medication after surgery. uses it for restless night.   Constipation: uses the mag citrate if needed for constipation.   Follow up with ENT is on 9/30.   Cologuard negative 06/29/19.  Does follow regularly with gyn.  She is due for mammogram; we will order this today.   Was following with Dr. Paulla Fore, but he moved. Right knee pain. Pain started around Jan 2020. Was seen by Dr. Maudie Mercury and referred to sports med. Was told that she tore meniscus. Was given steroid injection.There was benefit with this. But then pain came back.  There was mild arthritis on xray. Sports specialist asked her to get MRI for further eval. Last visit with him was march. Still has pain, still swollen. Swelling never improves. Did home exercises as prescribed by specialist. She wasn't sure how she injured initially; might have been when she was wearing high heels. Pain is most notable when going up stairs. Pain level going up stairs can be up to 5/10  No Known Allergies Current Meds  Medication Sig  . albuterol (VENTOLIN HFA) 108 (90 Base) MCG/ACT inhaler INHALE 1 TO 2 PUFFS BY MOUTH EVERY 6 HOURS AS NEEDED FOR WHEEZING FOR SHORTNESS OF BREATH (Patient taking differently: Inhale 1-2 puffs into the lungs every 6 (six) hours as needed for  wheezing or shortness of breath. )  . amLODipine (NORVASC) 5 MG tablet Take 1 tablet (5 mg total) by mouth daily.  Marland Kitchen aspirin EC 81 MG tablet Take 81 mg by mouth daily.  Marland Kitchen conjugated estrogens (PREMARIN) vaginal cream Place [id number] Applicatorfuls vaginally 2 (two) times a week.  . hydrochlorothiazide (HYDRODIURIL) 25 MG tablet Take 1 tablet (25 mg total) by mouth daily.  . magnesium citrate solution Take 296 mLs by mouth daily as needed (constipation).   . Multiple Vitamin (MULTIVITAMIN) capsule Take 1 capsule by mouth daily. Centrum silver  . sertraline (ZOLOFT) 50 MG tablet Take 1 tablet (50 mg total) by mouth daily.  . traZODone (DESYREL) 50 MG tablet Take 0.5-1 tablets (25-50 mg total) by mouth at bedtime as needed for sleep.  . vitamin B-12 (CYANOCOBALAMIN) 500 MCG tablet Take 500 mcg by mouth daily.  . [DISCONTINUED] amLODipine (NORVASC) 5 MG tablet Take 1 tablet (5 mg total) by mouth daily.    Review of Systems  Constitutional: Negative for chills, fatigue and fever.  Respiratory: Negative for cough, chest tightness, shortness of breath and wheezing.   Cardiovascular: Negative for chest pain, palpitations and leg swelling.    Objective:  BP 102/70 (BP Location: Left Arm, Patient Position: Sitting, Cuff Size: Normal)   Pulse 72   Temp (!) 97.3 F (36.3 C) (Temporal)   Ht 5\' 3"  (1.6 m)   Wt 144 lb 14.4 oz (65.7 kg)  LMP 06/29/2017   SpO2 98%   BMI 25.67 kg/m   Weight: 144 lb 14.4 oz (65.7 kg)   BP Readings from Last 3 Encounters:  07/12/19 102/70  06/20/19 110/72  06/16/19 132/90   Wt Readings from Last 3 Encounters:  07/12/19 144 lb 14.4 oz (65.7 kg)  06/18/19 147 lb 4.8 oz (66.8 kg)  06/16/19 147 lb 4.8 oz (66.8 kg)    Physical Exam Constitutional:      General: She is not in acute distress.    Appearance: She is well-developed.  Cardiovascular:     Rate and Rhythm: Normal rate and regular rhythm.     Heart sounds: Normal heart sounds. No murmur. No friction rub.   Pulmonary:     Effort: Pulmonary effort is normal. No respiratory distress.     Breath sounds: Normal breath sounds. No wheezing or rales.  Musculoskeletal:     Right lower leg: No edema.     Left lower leg: No edema.     Comments: There is some mild effusion right knee. There is medial and lateral JLT. There is crepitance with ROM. ROM is not restricted.   Neurological:     Mental Status: She is alert and oriented to person, place, and time.  Psychiatric:        Behavior: Behavior normal.     Assessment/Plan   1. Screening for breast cancer Ordered today.  - MM DIGITAL SCREENING BILATERAL; Future  2. Hypertension, unspecified type Amlodipine, hctz. Well controlled.  - CBC with Differential/Platelet; Future - Comprehensive metabolic panel; Future  3. Hyperlipidemia, unspecified hyperlipidemia type Will complete prior to next visit. - Lipid panel; Future - TSH; Future  4. Hyperglycemia Will recheck A1C with next set of bloodwork.   5. Right knee pain: see above. Will order MRI to help facilitate evaluation/treatment as this was next step in plan, but specialist  No longer at office. Can return to specialist after completion MRI.   Return in about 6 months (around 01/09/2020) for physical exam.    Micheline Rough, MD

## 2019-07-12 NOTE — Patient Instructions (Signed)
Try taking zoloft at bedtime. If still feeling drowsy let me know.

## 2019-07-20 ENCOUNTER — Encounter: Payer: Self-pay | Admitting: Gynecology

## 2019-08-07 ENCOUNTER — Ambulatory Visit
Admission: RE | Admit: 2019-08-07 | Discharge: 2019-08-07 | Disposition: A | Discharge status: Home / Self Care | Payer: 59 | Source: Ambulatory Visit | Attending: Family Medicine | Admitting: Family Medicine

## 2019-08-07 ENCOUNTER — Other Ambulatory Visit: Payer: Self-pay

## 2019-08-07 DIAGNOSIS — M25561 Pain in right knee: Secondary | ICD-10-CM

## 2019-08-07 DIAGNOSIS — G8929 Other chronic pain: Secondary | ICD-10-CM

## 2019-08-10 ENCOUNTER — Other Ambulatory Visit: Payer: Self-pay | Admitting: Family Medicine

## 2019-08-10 DIAGNOSIS — S83241D Other tear of medial meniscus, current injury, right knee, subsequent encounter: Secondary | ICD-10-CM

## 2019-08-11 ENCOUNTER — Other Ambulatory Visit: Payer: Self-pay | Admitting: *Deleted

## 2019-08-11 DIAGNOSIS — M1711 Unilateral primary osteoarthritis, right knee: Secondary | ICD-10-CM

## 2019-08-26 ENCOUNTER — Encounter: Payer: Self-pay | Admitting: Family Medicine

## 2019-08-26 ENCOUNTER — Other Ambulatory Visit: Payer: Self-pay

## 2019-08-26 ENCOUNTER — Ambulatory Visit (INDEPENDENT_AMBULATORY_CARE_PROVIDER_SITE_OTHER): Payer: 59 | Admitting: Orthopaedic Surgery

## 2019-08-26 VITALS — Ht 63.0 in | Wt 145.0 lb

## 2019-08-26 DIAGNOSIS — F419 Anxiety disorder, unspecified: Secondary | ICD-10-CM

## 2019-08-26 DIAGNOSIS — M1711 Unilateral primary osteoarthritis, right knee: Secondary | ICD-10-CM | POA: Diagnosis not present

## 2019-08-26 MED ORDER — CELECOXIB 200 MG PO CAPS: 200.0000 mg | ORAL_CAPSULE | Freq: Two times a day (BID) | ORAL | 3 refills | Status: DC

## 2019-08-26 MED ORDER — PENNSAID 2 % TD SOLN: 2.0000 g | Freq: Two times a day (BID) | TRANSDERMAL | 3 refills | Status: DC | PRN

## 2019-08-26 MED ORDER — SERTRALINE HCL 50 MG PO TABS: 50.0000 mg | ORAL_TABLET | Freq: Every day | ORAL | 1 refills | Status: DC

## 2019-08-26 NOTE — Progress Notes (Signed)
Office Visit Note   Patient: Ariel [name]           Date of Birth: 05/22/1961           MRN: [id number] Visit Date: 08/26/2019              Requested by: Caren Macadam, MD [place],   [id number] PCP: Caren Macadam, MD   Assessment & Plan: Visit Diagnoses:  1. Primary osteoarthritis of right knee     Plan: Impression is right knee tricompartmental DJD worst in the patellofemoral compartment.  The MRI was reviewed and the medial meniscal tear appears to be a small incomplete tear that is likely part of the overall degenerative process.  I do not see a displaced fragment.  Her DJD is quite pronounced.  These findings were reviewed in detail with the patient.  Based on discussion today we will start with a prescription for Celebrex and Pennsaid gel as well as home exercises and over-the-counter knee brace.  Questions encouraged and answered.  Follow-up as needed.  Follow-Up Instructions: Return if symptoms worsen or fail to improve.   Orders:  Orders Placed This Encounter  Procedures   Ambulatory referral to Physical Therapy   Meds ordered this encounter  Medications   celecoxib (CELEBREX) 200 MG capsule    Sig: Take 1 capsule (200 mg total) by mouth 2 (two) times daily.    Dispense:  60 capsule    Refill:  3   Diclofenac Sodium (PENNSAID) 2 % SOLN    Sig: Apply 2 g topically 2 (two) times daily as needed (to affected area).    Dispense:  112 g    Refill:  3      Procedures: No procedures performed   Clinical Data: No additional findings.   Subjective: Chief Complaint  Patient presents with   Right Knee - Pain    Ariel [name] comes in for evaluation of right knee pain for approximately a year.  Denies any definite injuries.  Occasional swelling.  The pain is worse when she goes upstairs and after prolonged immobilization such as sitting at work.  She takes Aleve and Advil without much relief.  Pain is worse when she wears heels.   Denies any mechanical symptoms.   Review of Systems  Constitutional: Negative.   HENT: Negative.   Eyes: Negative.   Respiratory: Negative.   Cardiovascular: Negative.   Endocrine: Negative.   Musculoskeletal: Negative.   Neurological: Negative.   Hematological: Negative.   Psychiatric/Behavioral: Negative.   All other systems reviewed and are negative.    Objective: Vital Signs: Ht 5\' 3"  (1.6 m)    Wt 145 lb (65.8 kg)    LMP 06/29/2017    BMI 25.69 kg/m   Physical Exam Vitals signs and nursing note reviewed.  Constitutional:      Appearance: She is well-developed.  HENT:     Head: Normocephalic and atraumatic.  Neck:     Musculoskeletal: Neck supple.  Pulmonary:     Effort: Pulmonary effort is normal.  Abdominal:     Palpations: Abdomen is soft.  Skin:    General: Skin is warm.     Capillary Refill: Capillary refill takes less than 2 seconds.  Neurological:     Mental Status: She is alert and oriented to person, place, and time.  Psychiatric:        Behavior: Behavior normal.        Thought Content: Thought content normal.  Judgment: Judgment normal.     Ortho Exam Right knee exam shows no joint effusion.  She has mild tenderness to palpation throughout her knee.  No specific joint line tenderness.  2+ patellofemoral crepitus.  Normal range of motion.  Collaterals cruciates are stable. Specialty Comments:  No specialty comments available.  Imaging: No results found.   PMFS History: Patient Active Problem List   Diagnosis Date Noted   Primary osteoarthritis of right knee 08/26/2019   Left thyroid nodule 03/12/2019   Right knee pain 12/01/2018   CTS (carpal tunnel syndrome) 01/22/2018   Mood disorder ([place]) 01/22/2018   Hypertension 05/29/2017   Hyperlipidemia 05/29/2017   Hyperglycemia 05/29/2017   Tobacco use 05/29/2017   Aortic atherosclerosis ([place]) 05/29/2017   Goiter 04/24/2017   Past Medical History:  Diagnosis Date    Chronic bronchitis ([place])    Dyspnea    Fibroids, intramural    Goiter    History of hypertension    Hyperlipidemia    Hypertension    Hypothyroidism    Osteopenia 01/2018   T score -1.3 FRAX 3.8% / 0.5%   Thyroid disease     Family History  Problem Relation Age of Onset   Thyroid disease Maternal Aunt    Hypertension Mother    Renal Disease Mother    Diabetes Father    Hypertension Father    Renal Disease Father        on dialysis   Stroke Maternal Grandmother    Breast cancer Maternal Grandmother        diagnosed in her 72's   Cancer Paternal Grandmother        ?    Past Surgical History:  Procedure Laterality Date   ABDOMINAL HYSTERECTOMY     07/2017-Dr [place] per patient; done for fibroids   CARDIAC ELECTROPHYSIOLOGY STUDY AND ABLATION  2010   hx tachycardia    LAPAROTOMY N/A 06/26/2017   Procedure: EXPLORATORY LAPAROTOMY;  Surgeon: Everitt Amber, MD;  Location: [facility];  Service: Gynecology;  Laterality: N/A;   THYROID LOBECTOMY Left 06/18/2019   Procedure: LEFT THYROID LOBECTOMY;  Surgeon: Armandina Gemma, MD;  Location: [facility];  Service: General;  Laterality: Left;   TONSILLECTOMY  1967   TUBAL LIGATION     X2, REVERSAL    Social History   Occupational History   Not on file  Tobacco Use   Smoking status: Former Smoker    Packs/day: 0.25    Years: 30.00    Pack years: 7.50    Types: Cigarettes    Quit date: 06/17/2019    Years since quitting: 0.1   Smokeless tobacco: Never Used   Tobacco comment: 4 CIGS A DAY  Substance and Sexual Activity   Alcohol use: Yes    Comment: OCC   Drug use: No   Sexual activity: Yes    Partners: Male    Comment: 1ST INTERCOURSE- 18, PARTNERS- 83, CURRENT PARTNER- 3.5 YRS

## 2019-08-30 ENCOUNTER — Ambulatory Visit: Payer: 59

## 2019-08-31 ENCOUNTER — Encounter: Payer: Self-pay | Admitting: Family Medicine

## 2019-09-07 ENCOUNTER — Ambulatory Visit: Payer: 59 | Admitting: Physical Therapy

## 2019-09-07 ENCOUNTER — Encounter: Payer: Self-pay | Admitting: Physical Therapy

## 2019-12-02 ENCOUNTER — Ambulatory Visit: Payer: 59 | Admitting: Orthopaedic Surgery

## 2019-12-08 ENCOUNTER — Other Ambulatory Visit: Payer: Self-pay | Admitting: *Deleted

## 2019-12-08 ENCOUNTER — Other Ambulatory Visit: Payer: Self-pay | Admitting: Family Medicine

## 2019-12-08 ENCOUNTER — Telehealth: Payer: Self-pay | Admitting: *Deleted

## 2019-12-08 DIAGNOSIS — G4700 Insomnia, unspecified: Secondary | ICD-10-CM

## 2019-12-08 DIAGNOSIS — F419 Anxiety disorder, unspecified: Secondary | ICD-10-CM

## 2019-12-08 MED ORDER — SERTRALINE HCL 50 MG PO TABS: 50.0000 mg | ORAL_TABLET | Freq: Every day | ORAL | 0 refills | Status: DC

## 2019-12-08 MED ORDER — TRAZODONE HCL 50 MG PO TABS: 25.0000 mg | ORAL_TABLET | Freq: Every evening | ORAL | 1 refills | Status: DC | PRN

## 2019-12-08 MED ORDER — HYDROCHLOROTHIAZIDE 25 MG PO TABS: 25.0000 mg | ORAL_TABLET | Freq: Every day | ORAL | 0 refills | Status: DC

## 2019-12-08 MED ORDER — AMLODIPINE BESYLATE 5 MG PO TABS: 5.0000 mg | ORAL_TABLET | Freq: Every day | ORAL | 0 refills | Status: DC

## 2019-12-08 NOTE — Telephone Encounter (Signed)
[facility] faxed a refill request for Trazodone.  Message sent to PCP.

## 2020-01-10 ENCOUNTER — Ambulatory Visit: Payer: 59 | Admitting: Family Medicine

## 2020-01-12 ENCOUNTER — Encounter: Payer: Self-pay | Admitting: Family Medicine

## 2020-01-12 ENCOUNTER — Ambulatory Visit: Payer: 59 | Admitting: Family Medicine

## 2020-01-12 ENCOUNTER — Other Ambulatory Visit: Payer: Self-pay

## 2020-01-12 VITALS — BP 120/84 | HR 88 | Temp 97.2°F | Ht 63.0 in | Wt 159.3 lb

## 2020-01-12 DIAGNOSIS — R739 Hyperglycemia, unspecified: Secondary | ICD-10-CM

## 2020-01-12 DIAGNOSIS — M1711 Unilateral primary osteoarthritis, right knee: Secondary | ICD-10-CM | POA: Diagnosis not present

## 2020-01-12 DIAGNOSIS — Z87891 Personal history of nicotine dependence: Secondary | ICD-10-CM

## 2020-01-12 DIAGNOSIS — M2550 Pain in unspecified joint: Secondary | ICD-10-CM

## 2020-01-12 DIAGNOSIS — I10 Essential (primary) hypertension: Secondary | ICD-10-CM | POA: Diagnosis not present

## 2020-01-12 DIAGNOSIS — E785 Hyperlipidemia, unspecified: Secondary | ICD-10-CM | POA: Diagnosis not present

## 2020-01-12 LAB — TSH: TSH: 4.77 u[IU]/mL — ABNORMAL HIGH (ref 0.35–4.50)

## 2020-01-12 LAB — CBC WITH DIFFERENTIAL/PLATELET
Basophils Absolute: 0 10*3/uL (ref 0.0–0.1)
Basophils Relative: 0.9 % (ref 0.0–3.0)
Eosinophils Absolute: 0.1 10*3/uL (ref 0.0–0.7)
Eosinophils Relative: 3.8 % (ref 0.0–5.0)
HCT: 40.5 % (ref 36.0–46.0)
Hemoglobin: 13.8 g/dL (ref 12.0–15.0)
Lymphocytes Relative: 41.1 % (ref 12.0–46.0)
Lymphs Abs: 1.6 10*3/uL (ref 0.7–4.0)
MCHC: 34.2 g/dL (ref 30.0–36.0)
MCV: 94.1 fl (ref 78.0–100.0)
Monocytes Absolute: 0.3 10*3/uL (ref 0.1–1.0)
Monocytes Relative: 7 % (ref 3.0–12.0)
Neutro Abs: 1.8 10*3/uL (ref 1.4–7.7)
Neutrophils Relative %: 47.2 % (ref 43.0–77.0)
Platelets: 220 10*3/uL (ref 150.0–400.0)
RBC: 4.3 Mil/uL (ref 3.87–5.11)
RDW: 12.9 % (ref 11.5–15.5)
WBC: 3.8 10*3/uL — ABNORMAL LOW (ref 4.0–10.5)

## 2020-01-12 LAB — COMPREHENSIVE METABOLIC PANEL
ALT: 12 U/L (ref 0–35)
AST: 22 U/L (ref 0–37)
Albumin: 4.3 g/dL (ref 3.5–5.2)
Alkaline Phosphatase: 116 U/L (ref 39–117)
BUN: 13 mg/dL (ref 6–23)
CO2: 32 mEq/L (ref 19–32)
Calcium: 10.1 mg/dL (ref 8.4–10.5)
Chloride: 99 mEq/L (ref 96–112)
Creatinine, Ser: 0.86 mg/dL (ref 0.40–1.20)
GFR: 82.3 mL/min (ref >60.00)
Glucose, Bld: 87 mg/dL (ref 70–99)
Potassium: 3.5 mEq/L (ref 3.5–5.1)
Sodium: 138 mEq/L (ref 135–145)
Total Bilirubin: 0.6 mg/dL (ref 0.2–1.2)
Total Protein: 7.2 g/dL (ref 6.0–8.3)

## 2020-01-12 LAB — LIPID PANEL
Cholesterol: 289 mg/dL — ABNORMAL HIGH (ref 0–200)
HDL: 61.8 mg/dL (ref >39.00)
LDL Cholesterol: 203 mg/dL — ABNORMAL HIGH (ref 0–99)
NonHDL: 227.01
Total CHOL/HDL Ratio: 5
Triglycerides: 119 mg/dL (ref 0.0–149.0)
VLDL: 23.8 mg/dL (ref 0.0–40.0)

## 2020-01-12 LAB — HEMOGLOBIN A1C: Hgb A1c MFr Bld: 5.2 % (ref 4.6–6.5)

## 2020-01-12 LAB — C-REACTIVE PROTEIN: CRP: <1 mg/dL (ref 0.5–20.0)

## 2020-01-12 LAB — SEDIMENTATION RATE: Sed Rate: 21 mm/hr (ref 0–30)

## 2020-01-12 NOTE — Progress Notes (Signed)
Ariel [name] DOB: 12/25/1960 Encounter date: 01/12/2020  This is a 59 y.o. female who presents with Chief Complaint  Patient presents with  . Follow-up    History of present illness: Feels she might be getting arthritis in right hand - fingers are very stiff. Types all day. Esp in morning, but feels it persists through day. Decreased hand strength. Right knee is "shot". Has been compliant with recommendations except she was not able to do the PT visit. Got braces which she wears and has a stand up desk which helps a little. Now really hurting when she goes up the stairs.   HTN: on amlodipine, hctz but not checking at home.   Hasn't smoked; no cigarette since August.   zoloft is doing well for mood. Feels like mood is very good. Takes medication at night.   Constipation: uses the mag citrate if needed for constipation.   Cologuard negative 06/29/19.  Does follow regularly with gyn.   No Known Allergies Current Meds  Medication Sig  . amLODipine (NORVASC) 5 MG tablet Take 1 tablet (5 mg total) by mouth daily.  Marland Kitchen aspirin EC 81 MG tablet Take 81 mg by mouth daily.  Marland Kitchen conjugated estrogens (PREMARIN) vaginal cream Place [id number] Applicatorfuls vaginally 2 (two) times a week.  . Diclofenac Sodium (PENNSAID) 2 % SOLN Apply 2 g topically 2 (two) times daily as needed (to affected area).  . hydrochlorothiazide (HYDRODIURIL) 25 MG tablet Take 1 tablet (25 mg total) by mouth daily.  . magnesium citrate solution Take 296 mLs by mouth daily as needed (constipation).   . Multiple Vitamin (MULTIVITAMIN) capsule Take 1 capsule by mouth daily. Centrum silver  . sertraline (ZOLOFT) 50 MG tablet Take 1 tablet (50 mg total) by mouth daily.  . traZODone (DESYREL) 50 MG tablet Take 0.5-1 tablets (25-50 mg total) by mouth at bedtime as needed for sleep.    Review of Systems  Constitutional: Negative for chills, fatigue and fever.  Respiratory: Negative for cough, chest tightness, shortness of breath  and wheezing.   Cardiovascular: Negative for chest pain, palpitations and leg swelling.  Musculoskeletal: Positive for arthralgias, gait problem (due to knee pain) and joint swelling.    Objective:  BP 120/84 (BP Location: Left Arm, Patient Position: Sitting, Cuff Size: Large)   Pulse 88   Temp (!) 97.2 F (36.2 C) (Temporal)   Ht 5\' 3"  (1.6 m)   Wt 159 lb 4.8 oz (72.3 kg)   LMP 06/29/2017   SpO2 98%   BMI 28.22 kg/m   Weight: 159 lb 4.8 oz (72.3 kg)   BP Readings from Last 3 Encounters:  01/12/20 120/84  07/12/19 102/70  06/20/19 110/72   Wt Readings from Last 3 Encounters:  01/12/20 159 lb 4.8 oz (72.3 kg)  08/26/19 145 lb (65.8 kg)  07/12/19 144 lb 14.4 oz (65.7 kg)    Physical Exam Constitutional:      General: She is not in acute distress.    Appearance: She is well-developed.  Cardiovascular:     Rate and Rhythm: Normal rate and regular rhythm.     Heart sounds: Normal heart sounds. No murmur. No friction rub.  Pulmonary:     Effort: Pulmonary effort is normal. No respiratory distress.     Breath sounds: Normal breath sounds. No wheezing or rales.  Musculoskeletal:        General: Swelling (right knee) present.     Right lower leg: No edema.     Left lower leg:  No edema.     Comments: Mild PIP edema bilateral all digits. No significant edema noted on other joints of the hands.  Neurological:     Mental Status: She is alert and oriented to person, place, and time.  Psychiatric:        Behavior: Behavior normal.     Assessment/Plan 1. Hypertension, unspecified type Improved control; continue current medication - Comprehensive metabolic panel - CBC with Differential/Platelet  2. Hyperlipidemia, unspecified hyperlipidemia type Has been diet controlled. - TSH - Lipid panel  3. Hyperglycemia Has been diet controlled. Limit sweets, carbohydrates. - Hemoglobin A1c  4. Primary osteoarthritis of right knee Worsening of knee pain. Would like to get second  opinion. Referral placed. - Ambulatory referral to Orthopedics  5. Arthralgia, unspecified joint In addition to pain in the knee, she is having pains in the hands that affects her daily. Worse in the morning with some improvement as the day progresses. - ANA; Future - C-reactive protein; Future - Sedimentation rate; Future - Rheumatoid factor; Future - Rheumatoid factor - Sedimentation rate - C-reactive protein - ANA  6. History of tobacco use disorder - CT CHEST LUNG CANCER SCREENING LOW DOSE WO CONTRAST; Future    Return for pending labs.     Micheline Rough, MD

## 2020-01-12 NOTE — Patient Instructions (Signed)
We are committed to keeping you informed about the COVID-19 vaccine.  As the vaccine continues to become available for each phase, we will ensure that patients who meet the criteria receive the information they need to access vaccination opportunities. Continue to check your MyChart account and [email or web address] for updates.   We are following [place]'s phased rollout of the vaccine. Currently vaccines are being administered to health care workers, long term care staff and residents, anyone 57 years of age and older, and frontline essential workers.    [facility] has created mass vaccination clinics in [place], [place] and [place] counties in partnership with [place], [place] and [place] health departments. Vaccination at these clinics is by appointment only. Walk-ins will NOT be accepted.  NOTICE: All [facility] appointments for vaccine are currently full. Please check back as we look to make more opportunities available soon.  [place] is opening appointments weekly, based on the number of vaccine doses provided by the state. To receive email notices about when we will open additional appointments each week, please sign up for our weekly email with information about COVID-19 vaccine appointment availability   There are Currently Three Other Vaccination Opportunities in [place]:  1. [place]) Eligible members of the public can make appointments online for a vaccine at the federally supported [place] in [place] at [employer]. Appointments can be made online by visiting [email or web address].  The FEMA site will operate seven days a week with the capacity to provide up to 3,000 vaccinations per day, with options for drive-thru service in the parking lot and service at an indoor clinic. The federal government will provide the center's vaccine supply,  which will be in addition to supply [facility] receives through [employer] from the Centers for Disease Control.  [place] health departments in [facility] service area are also scheduling vaccine appointments for those eligible. Learn more about each county's vaccination efforts in the website links below:  - [place]  3. [facility] throughout [place] are offering COVID-19 vaccines to eligible individuals. Click here to schedule appointments and learn more.  Find all vaccine locations throughout the state of [place] at [email or web address]://[email or web address].com.[email or web address]  Vaccine Safety and Effectiveness All three vaccines authorized for emergency use by the U.S. Food and Drug Administration are recommended by the U.S. Centers for Disease Control and Prevention as safe and effective treatments for the prevention of COVID-19. These include the [employer], Science writer and [employer].  Clinical trials involving tens of thousands of people for each vaccine have shown that the vaccines are extremely effective in preventing COVID-19 with no serious safety concerns. Side effects reported include a sore arm at the injection site, fatigue, headache, chills and fever. While side effects are sometimes higher than for a typical flu vaccine, they are lower in many ways than side effects from the leading vaccine to prevent shingles.  Side effects are signs that a vaccine is working and are related to your immune system being stimulated to produce antibodies against infection. Side effects from vaccination are far less significant than health impacts from COVID-19.  Staying Informed Pharmacists, infectious disease doctors, critical care nurses and other experts at [facility] continue to speak publicly through media interviews and direct communication with our patients and communities about the safety,  effectiveness and importance  of vaccines to eliminate COVID-19.  In addition, reliable information on vaccine safety, effectiveness, side effects and more is available on the following websites:  [place] Department of Health and Human Services COVID-19 Vaccine Information Website.  U.S. Centers for Disease Control and Prevention [id number] Human resources officer.  Staying Safe We agree with the CDC on what we can do to help our communities get back to normal: Getting "back to normal" is going to take all of our tools. If we use all the tools we have, we stand the best chance of getting our families, communities, schools and workplaces "back to normal" sooner:  Get vaccinated as soon as vaccines become available within the phase of the state's vaccination rollout plan for which you meet the eligibility criteria.  Wear a mask.  Stay 6 feet from others and avoid crowds.  Wash hands often.  Masking, social distancing and hand hygiene should be followed by everyone, including those who have been vaccinated.  For our most current information, please visit [email or web address].

## 2020-01-13 ENCOUNTER — Encounter: Payer: Self-pay | Admitting: Family Medicine

## 2020-01-13 NOTE — Telephone Encounter (Signed)
The patient called today to get her X-Rays put on a disk for her to come pick up. I advised her that she has to fill out a medical records release form and [facility] has already placed the images on a disk for the patient.  The X-Ray report, medical records release form, and X-Ray disk are in the filing cabinet up front.  The medical release form just needs to be signed by the patient.

## 2020-01-14 ENCOUNTER — Other Ambulatory Visit: Payer: Self-pay | Admitting: Family Medicine

## 2020-01-14 ENCOUNTER — Encounter: Payer: Self-pay | Admitting: Family Medicine

## 2020-01-14 ENCOUNTER — Telehealth: Payer: Self-pay | Admitting: Endocrinology

## 2020-01-14 DIAGNOSIS — E785 Hyperlipidemia, unspecified: Secondary | ICD-10-CM

## 2020-01-14 LAB — ANTI-NUCLEAR AB-TITER (ANA TITER)
ANA TITER: 1:80 {titer} — ABNORMAL HIGH
ANA Titer 1: 1:80 {titer} — ABNORMAL HIGH

## 2020-01-14 LAB — ANA: Anti Nuclear Antibody (ANA): POSITIVE — AB

## 2020-01-14 LAB — RHEUMATOID FACTOR: Rheumatoid fact SerPl-aCnc: <14 IU/mL (ref <14)

## 2020-01-14 MED ORDER — ATORVASTATIN CALCIUM 20 MG PO TABS: 20.0000 mg | ORAL_TABLET | Freq: Every day | ORAL | 1 refills | Status: DC

## 2020-01-14 NOTE — Telephone Encounter (Signed)
Ariel [name] spoke with the pt and informed her of the results-see results note.

## 2020-01-14 NOTE — Telephone Encounter (Signed)
Patient has called to advise tha

## 2020-01-18 NOTE — Addendum Note (Signed)
Addended by: Agnes Lawrence on: 01/18/2020 09:35 AM   Modules accepted: Orders

## 2020-01-19 ENCOUNTER — Encounter: Payer: Self-pay | Admitting: Family Medicine

## 2020-01-19 ENCOUNTER — Other Ambulatory Visit: Payer: Self-pay

## 2020-01-24 ENCOUNTER — Encounter: Payer: Self-pay | Admitting: Family Medicine

## 2020-01-24 ENCOUNTER — Encounter: Payer: Self-pay | Admitting: Endocrinology

## 2020-01-24 ENCOUNTER — Ambulatory Visit: Payer: 59 | Admitting: Endocrinology

## 2020-01-24 ENCOUNTER — Other Ambulatory Visit: Payer: Self-pay | Admitting: Family Medicine

## 2020-01-24 ENCOUNTER — Other Ambulatory Visit: Payer: Self-pay

## 2020-01-24 ENCOUNTER — Ambulatory Visit
Admission: RE | Admit: 2020-01-24 | Discharge: 2020-01-24 | Disposition: A | Discharge status: Home / Self Care | Payer: 59 | Source: Ambulatory Visit | Attending: Endocrinology | Admitting: Endocrinology

## 2020-01-24 VITALS — BP 114/80 | HR 85 | Ht 63.0 in | Wt 153.8 lb

## 2020-01-24 DIAGNOSIS — E041 Nontoxic single thyroid nodule: Secondary | ICD-10-CM

## 2020-01-24 MED ORDER — LEVOTHYROXINE SODIUM 50 MCG PO TABS: 50.0000 ug | ORAL_TABLET | Freq: Every day | ORAL | 3 refills | Status: DC

## 2020-01-24 MED ORDER — MELOXICAM 15 MG PO TABS: 15.0000 mg | ORAL_TABLET | Freq: Every day | ORAL | 0 refills | Status: DC

## 2020-01-24 NOTE — Patient Instructions (Addendum)
Let's recheck the ultrasound.  you will receive a phone call, about a day and time for an appointment. I have sent a prescription to your pharmacy, for thyroid hormone.  You will probably need this for the rest of your life.  However, that is not [id number] certain. Please redo the blood test in 1 month.  Please call ahead, so the lab can expect you. Please come back for a follow-up appointment in 6 months.

## 2020-01-24 NOTE — Progress Notes (Signed)
Subjective:    Patient ID: Ariel [name], female    DOB: December 28, 1960, 59 y.o.   MRN: [id number]  HPI Pt returns for f/u of multinodular goiter (dx'ed 2007, when [place] lived in [place].  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  She is on no thyroid medication now.  Past Medical History:  Diagnosis Date  . Chronic bronchitis ([place])   . Dyspnea   . Fibroids, intramural   . Goiter   . History of hypertension   . Hyperlipidemia   . Hypertension   . Hypothyroidism   . Osteopenia 01/2018   T score -1.3 FRAX 3.8% / 0.5%  . Thyroid disease   . Tobacco use 05/29/2017    Past Surgical History:  Procedure Laterality Date  . ABDOMINAL HYSTERECTOMY     07/2017-Dr [place] per patient; done for fibroids  . CARDIAC ELECTROPHYSIOLOGY STUDY AND ABLATION  2010   hx tachycardia   . LAPAROTOMY N/A 06/26/2017   Procedure: EXPLORATORY LAPAROTOMY;  Surgeon: Everitt Amber, MD;  Location: [facility];  Service: Gynecology;  Laterality: N/A;  . THYROID LOBECTOMY Left 06/18/2019   Procedure: LEFT THYROID LOBECTOMY;  Surgeon: Armandina Gemma, MD;  Location: [facility];  Service: General;  Laterality: Left;  . TONSILLECTOMY  1967  . TUBAL LIGATION     X2, REVERSAL     Social History   Socioeconomic History  . Marital status: Single    Spouse name: Not on file  . Number of children: Not on file  . Years of education: Not on file  . Highest education level: Not on file  Occupational History  . Not on file  Tobacco Use  . Smoking status: Former Smoker    Packs/day: 0.25    Years: 30.00    Pack years: 7.50    Types: Cigarettes    Quit date: 06/17/2019    Years since quitting: 0.6  . Smokeless  tobacco: Never Used  Substance and Sexual Activity  . Alcohol use: Yes    Comment: OCC  . Drug use: No  . Sexual activity: Yes    Partners: Male    Comment: 1ST INTERCOURSE- 27, PARTNERS- 22, CURRENT PARTNER- 3.5 YRS   Other Topics Concern  . 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 Christian, no church currenty      Lifestyle: "poor diet"; no exercise   Social Determinants of Health   Financial Resource Strain:   . Difficulty of Paying Living Expenses:   Food Insecurity:   . Worried About Charity fundraiser in the Last Year:   . Arboriculturist in the Last Year:   Transportation Needs:   . Film/video editor (Medical):   Marland Kitchen Lack of Transportation (Non-Medical):   Physical Activity:   . Days of Exercise per Week:   . Minutes of Exercise per Session:   Stress:   . Feeling of Stress :   Social Connections:   . Frequency of Communication with Friends and Family:   .  Frequency of Social Gatherings with Friends and Family:   . Attends Religious Services:   . Active Member of Clubs or Organizations:   . Attends Archivist Meetings:   Marland Kitchen Marital Status:   Intimate Partner Violence:   . Fear of Current or Ex-Partner:   . Emotionally Abused:   Marland Kitchen Physically Abused:   . Sexually Abused:     Current Outpatient Medications on File Prior to Visit  Medication Sig Dispense Refill  . amLODipine (NORVASC) 5 MG tablet Take 1 tablet (5 mg total) by mouth daily. 90 tablet 0  . aspirin EC 81 MG tablet Take 81 mg by mouth daily.    Marland Kitchen atorvastatin (LIPITOR) 20 MG tablet Take 1 tablet (20 mg total) by mouth daily. 90 tablet 1  . conjugated estrogens (PREMARIN) vaginal cream Place 3.42 Applicatorfuls vaginally 2 (two) times a week. 42.5 g 0  . hydrochlorothiazide (HYDRODIURIL) 25 MG tablet Take 1 tablet (25 mg total) by mouth daily. 90 tablet 0  . magnesium citrate solution  Take 296 mLs by mouth daily as needed (constipation).     . Multiple Vitamin (MULTIVITAMIN) capsule Take 1 capsule by mouth daily. Centrum silver    . sertraline (ZOLOFT) 50 MG tablet Take 1 tablet (50 mg total) by mouth daily. 90 tablet 0  . traZODone (DESYREL) 50 MG tablet Take 0.5-1 tablets (25-50 mg total) by mouth at bedtime as needed for sleep. 90 tablet 1  . vitamin B-12 (CYANOCOBALAMIN) 500 MCG tablet Take 500 mcg by mouth daily.     No current facility-administered medications on file prior to visit.    No Known Allergies  Family History  Problem Relation Age of Onset  . Thyroid disease Maternal Aunt   . Hypertension Mother   . Renal Disease Mother   . Diabetes Father   . Hypertension Father   . Renal Disease Father        on dialysis  . Stroke Maternal Grandmother   . Breast cancer Maternal Grandmother        diagnosed in her 82's  . Cancer Paternal Grandmother        ?    BP 114/80   Pulse 85   Ht 5' 3"  (1.6 m)   Wt 153 lb 12.8 oz (69.8 kg)   LMP 06/29/2017   SpO2 98%   BMI 27.24 kg/m    Review of Systems Denies neck pain.      Objective:   Physical Exam VITAL SIGNS:  See vs page GENERAL: no distress Neck: a healed scar is present.  I do not appreciate a nodule in the thyroid or elsewhere in the neck.     Lab Results  Component Value Date   CALCIUM 10.1 01/12/2020   Lab Results  Component Value Date   TSH 4.77 (H) 01/12/2020       Assessment & Plan:  Hypothyroidism, new PTC: small focus: we'll follow with [place].  Patient Instructions  Let's recheck the ultrasound.  you will receive a phone call, about a day and time for an appointment. I have sent a prescription to your pharmacy, for thyroid hormone.  You will probably need this for the rest of your life.  However, that is not 876% certain. Please redo the blood test in 1 month.  Please call ahead, so the lab can expect you. Please come back for a follow-up appointment in 6 months.

## 2020-01-26 NOTE — Progress Notes (Signed)
Noted ok. 

## 2020-02-20 ENCOUNTER — Other Ambulatory Visit: Payer: Self-pay | Admitting: Family Medicine

## 2020-02-20 DIAGNOSIS — F419 Anxiety disorder, unspecified: Secondary | ICD-10-CM

## 2020-02-23 ENCOUNTER — Other Ambulatory Visit: Payer: 59

## 2020-02-29 ENCOUNTER — Encounter: Payer: Self-pay | Admitting: Family Medicine

## 2020-02-29 DIAGNOSIS — E039 Hypothyroidism, unspecified: Secondary | ICD-10-CM

## 2020-02-29 DIAGNOSIS — R252 Cramp and spasm: Secondary | ICD-10-CM

## 2020-02-29 DIAGNOSIS — R0602 Shortness of breath: Secondary | ICD-10-CM

## 2020-02-29 DIAGNOSIS — R0781 Pleurodynia: Secondary | ICD-10-CM

## 2020-02-29 DIAGNOSIS — R5383 Other fatigue: Secondary | ICD-10-CM

## 2020-03-02 ENCOUNTER — Other Ambulatory Visit: Payer: Self-pay | Admitting: Family Medicine

## 2020-03-03 ENCOUNTER — Other Ambulatory Visit: Payer: 59

## 2020-03-05 ENCOUNTER — Encounter: Payer: Self-pay | Admitting: Family Medicine

## 2020-03-06 NOTE — Telephone Encounter (Signed)
I was able to get in touch with the patient.  She states that since her Covid vaccine, she has not been feeling well.  She has had no fevers, but has felt chilled and sleepy for about 3 weeks.  She is having muscle aches with cramping in her feet, especially at night.  She had some bruising in the legs, but this has improved somewhat since stopping her aspirin.  She has no leg swelling.  She had a single episode of vomiting this morning while brushing her teeth, but otherwise no vomiting.  No loose stools.  She has had some constipation, but she attributes this to not eating much and poor appetite during the last few weeks.  She states that her urine is dark yellow in color, but no difficulty or discomfort with urination.  No abdominal pain.  She does feel like she cannot get a big breath in, but has no cough or other respiratory symptoms.  She states that it hurts on the right side of her rib cage with a deep breath in.  She did have Covid testing which was negative.  She has no headache.  No heart racing.  Her blood pressures have been running elevated at home when she was checking, so she stopped checking.  She did check when she was on the phone with me and blood pressure was 180/85 with heart rate of 74.  This is similar to the numbers that she was getting at home and she was checking regularly, but much higher than what we had on office visits for her.  We are not certain that blood pressure cuff is accurate.  Going to have her come in on Friday for an in person visit and bring her home cuff so we can check against this.  Do not feel comfortable adjusting medications without getting a second blood pressure cuff reading since she has been running very well on all previous office visits.  We will also get her in for blood work as well as a chest x-ray and can do any follow-up testing as needed on Friday when she is in the office for pending results.

## 2020-03-07 NOTE — Telephone Encounter (Signed)
Patient called yesterday and set up for follow up visit.

## 2020-03-08 ENCOUNTER — Ambulatory Visit (INDEPENDENT_AMBULATORY_CARE_PROVIDER_SITE_OTHER)
Admission: RE | Admit: 2020-03-08 | Discharge: 2020-03-08 | Disposition: A | Discharge status: Home / Self Care | Payer: 59 | Source: Ambulatory Visit | Attending: Family Medicine | Admitting: Family Medicine

## 2020-03-08 ENCOUNTER — Other Ambulatory Visit (INDEPENDENT_AMBULATORY_CARE_PROVIDER_SITE_OTHER): Payer: 59

## 2020-03-08 ENCOUNTER — Other Ambulatory Visit: Payer: Self-pay

## 2020-03-08 DIAGNOSIS — E039 Hypothyroidism, unspecified: Secondary | ICD-10-CM | POA: Diagnosis not present

## 2020-03-08 DIAGNOSIS — R5383 Other fatigue: Secondary | ICD-10-CM

## 2020-03-08 DIAGNOSIS — R252 Cramp and spasm: Secondary | ICD-10-CM

## 2020-03-08 DIAGNOSIS — R0602 Shortness of breath: Secondary | ICD-10-CM | POA: Diagnosis not present

## 2020-03-08 DIAGNOSIS — R0781 Pleurodynia: Secondary | ICD-10-CM

## 2020-03-08 LAB — CBC WITH DIFFERENTIAL/PLATELET
Basophils Absolute: 0 10*3/uL (ref 0.0–0.1)
Basophils Relative: 0.8 % (ref 0.0–3.0)
Eosinophils Absolute: 0.1 10*3/uL (ref 0.0–0.7)
Eosinophils Relative: 3.7 % (ref 0.0–5.0)
HCT: 36.2 % (ref 36.0–46.0)
Hemoglobin: 12.4 g/dL (ref 12.0–15.0)
Lymphocytes Relative: 32.5 % (ref 12.0–46.0)
Lymphs Abs: 1.2 10*3/uL (ref 0.7–4.0)
MCHC: 34.2 g/dL (ref 30.0–36.0)
MCV: 92.6 fl (ref 78.0–100.0)
Monocytes Absolute: 0.3 10*3/uL (ref 0.1–1.0)
Monocytes Relative: 8.4 % (ref 3.0–12.0)
Neutro Abs: 2 10*3/uL (ref 1.4–7.7)
Neutrophils Relative %: 54.6 % (ref 43.0–77.0)
Platelets: 247 10*3/uL (ref 150.0–400.0)
RBC: 3.91 Mil/uL (ref 3.87–5.11)
RDW: 12.9 % (ref 11.5–15.5)
WBC: 3.7 10*3/uL — ABNORMAL LOW (ref 4.0–10.5)

## 2020-03-08 LAB — CK: Total CK: 93 U/L (ref 7–177)

## 2020-03-08 LAB — COMPREHENSIVE METABOLIC PANEL
ALT: 12 U/L (ref 0–35)
AST: 26 U/L (ref 0–37)
Albumin: 4 g/dL (ref 3.5–5.2)
Alkaline Phosphatase: 108 U/L (ref 39–117)
BUN: 8 mg/dL (ref 6–23)
CO2: 30 mEq/L (ref 19–32)
Calcium: 10.2 mg/dL (ref 8.4–10.5)
Chloride: 101 mEq/L (ref 96–112)
Creatinine, Ser: 0.84 mg/dL (ref 0.40–1.20)
GFR: 84.52 mL/min (ref >60.00)
Glucose, Bld: 144 mg/dL — ABNORMAL HIGH (ref 70–99)
Potassium: 4.3 mEq/L (ref 3.5–5.1)
Sodium: 139 mEq/L (ref 135–145)
Total Bilirubin: 0.5 mg/dL (ref 0.2–1.2)
Total Protein: 7.5 g/dL (ref 6.0–8.3)

## 2020-03-08 LAB — TSH: TSH: 1.16 u[IU]/mL (ref 0.35–4.50)

## 2020-03-08 LAB — SEDIMENTATION RATE: Sed Rate: 63 mm/hr — ABNORMAL HIGH (ref 0–30)

## 2020-03-09 MED ORDER — PREDNISONE 20 MG PO TABS
ORAL_TABLET | ORAL | 0 refills | Status: DC
Start: 2020-03-09 — End: 2020-03-10

## 2020-03-10 ENCOUNTER — Telehealth: Payer: Self-pay | Admitting: Endocrinology

## 2020-03-10 ENCOUNTER — Other Ambulatory Visit: Payer: Self-pay

## 2020-03-10 ENCOUNTER — Ambulatory Visit: Payer: 59 | Admitting: Family Medicine

## 2020-03-10 VITALS — BP 120/80 | HR 73 | Temp 97.3°F | Ht 63.0 in | Wt 152.9 lb

## 2020-03-10 DIAGNOSIS — N62 Hypertrophy of breast: Secondary | ICD-10-CM | POA: Diagnosis not present

## 2020-03-10 DIAGNOSIS — R739 Hyperglycemia, unspecified: Secondary | ICD-10-CM

## 2020-03-10 DIAGNOSIS — R0602 Shortness of breath: Secondary | ICD-10-CM

## 2020-03-10 LAB — POCT GLYCOSYLATED HEMOGLOBIN (HGB A1C): Hemoglobin A1C: 5.1 % (ref 4.0–5.6)

## 2020-03-10 LAB — MAGNESIUM, RBC: Magnesium RBC: 4.8 mg/dL (ref 4.0–6.4)

## 2020-03-10 LAB — EXTRA SPECIMEN

## 2020-03-10 LAB — D-DIMER, QUANTITATIVE: D-Dimer, Quant: 0.49 mcg/mL FEU (ref <0.50)

## 2020-03-10 MED ORDER — PREDNISONE 20 MG PO TABS
40.0000 mg | ORAL_TABLET | Freq: Every day | ORAL | 0 refills | Status: DC
Start: 2020-03-10 — End: 2020-04-13

## 2020-03-10 NOTE — Patient Instructions (Signed)
Hold mobic while taking prednisone. See if mobic is needed once prednisone completed and stop med if not needed for joint pain.   Take prednisone with food.   Update me next week once you complete the prednisone course and let me know how pain, breathing are doing.

## 2020-03-10 NOTE — Telephone Encounter (Signed)
Patient called to let Dr. Loanne Drilling know that Dr. Ethlyn Gallery (PCP) ordered labs for patient that included TSH which results are in patient's MyChart (that is why patient cancelled her lab appointment at our office).

## 2020-03-10 NOTE — Progress Notes (Signed)
Ariel [name] DOB: November 11, 1960 Encounter date: 03/10/2020  This is a 59 y.o. female who presents with Chief Complaint  Patient presents with  . Follow-up    History of present illness: Brought wrist cuff in with her today: home reading is 207/97. All home readings were elevated for her with this wrist cuff.   Was fine after last visit. First COVID shot 4/8 and was fine. After 2nd shot on 4/28 was ok that day, but next morning had headache and rested whole day. Thought catching a cold. Got to point where she had "chill bumps" all day long. Energy was completely drained. Even putting head on keyboard. Then started noticing that she was getting short of breath when taking short walks. For a little while had muscle aches/pains and then foot cramps but all muscle issues have gone away. Then got COVID test which was negative. She called [place] after that. No longer having chills. Feeling better overall. Having more pain in side, but also breast size has gone up a whole cup - thinks that is causing the discomfort on rib cage. 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 Loses weight and size around goes down, but not cup size.   She is interested in breast reduction. They are extremely heavy. Right breast is larger/heavier.   Really doesn't feel like the breathing is getting better. Sometimes with deep breath hurts in throat/chest.   Sometimes if she drinks soda quickly and burps she gets relief with discomfort on right. Bowels working ok. No vomiting.   No Known Allergies Current Meds  Medication Sig  . amLODipine (NORVASC) 5 MG tablet TAKE 1 TABLET BY MOUTH EVERY DAY  . atorvastatin (LIPITOR) 20 MG tablet Take 1 tablet (20 mg total) by mouth daily.  Marland Kitchen conjugated estrogens (PREMARIN) vaginal cream Place [id number] Applicatorfuls vaginally 2 (two) times a week.  . hydrochlorothiazide (HYDRODIURIL) 25 MG tablet TAKE 1 TABLET BY MOUTH EVERY DAY  . levothyroxine (SYNTHROID) 50 MCG tablet Take 1 tablet (50 mcg total) by mouth  daily.  . magnesium citrate solution Take 296 mLs by mouth daily as needed (constipation).   . meloxicam (MOBIC) 15 MG tablet Take 1 tablet (15 mg total) by mouth daily.  . Multiple Vitamin (MULTIVITAMIN) capsule Take 1 capsule by mouth daily. Centrum silver  . sertraline (ZOLOFT) 50 MG tablet PHYSICAL EXAM DUE. TAKE 1 TABLET BY MOUTH EVERY DAY. PHYSICAL DUE.  . traZODone (DESYREL) 50 MG tablet Take 0.5-1 tablets (25-50 mg total) by mouth at bedtime as needed for sleep.  . vitamin B-12 (CYANOCOBALAMIN) 500 MCG tablet Take 500 mcg by mouth daily.  . [DISCONTINUED] predniSONE (DELTASONE) 20 MG tablet Take 3 tablets by mouth with food on day 1, then 2 tablets once a day thereafter    Review of Systems  Constitutional: Negative for chills, fatigue and fever.  HENT: Negative for congestion, sinus pressure and sinus pain.   Respiratory: Positive for shortness of breath (with exertion). Negative for cough, chest tightness and wheezing.   Cardiovascular: Negative for chest pain, palpitations and leg swelling.  Gastrointestinal: Negative for abdominal pain, blood in stool, constipation, nausea and vomiting.  Musculoskeletal: Positive for arthralgias.  Neurological: Weakness: improved. Headaches: improved.    Objective:  BP 120/80 (BP Location: Left Arm, Patient Position: Sitting, Cuff Size: Normal)   Pulse 73   Temp (!) 97.3 F (36.3 C) (Temporal)   Ht 5\' 3"  (1.6 m)   Wt 152 lb 14.4 oz (69.4 kg)   LMP 06/29/2017   SpO2  98%   BMI 27.09 kg/m   Weight: 152 lb 14.4 oz (69.4 kg)   BP Readings from Last 3 Encounters:  03/10/20 120/80  01/24/20 114/80  01/12/20 120/84   Wt Readings from Last 3 Encounters:  03/10/20 152 lb 14.4 oz (69.4 kg)  01/24/20 153 lb 12.8 oz (69.8 kg)  01/12/20 159 lb 4.8 oz (72.3 kg)    Physical Exam Constitutional:      General: She is not in acute distress.    Appearance: She is well-developed.  Cardiovascular:     Rate and Rhythm: Normal rate and regular  rhythm.     Heart sounds: Normal heart sounds. No murmur. No friction rub.  Pulmonary:     Effort: Pulmonary effort is normal. No accessory muscle usage or respiratory distress.     Breath sounds: Decreased breath sounds (diffuse bilat) present. No wheezing, rhonchi or rales.  Chest:     Comments: Bra straps have caused hyperpigmentation and indentations bilaterally and shoulders approximately 1 inch in diameter. Musculoskeletal:     Right lower leg: No edema.     Left lower leg: No edema.  Neurological:     Mental Status: She is alert and oriented to person, place, and time.  Psychiatric:        Behavior: Behavior normal.     Assessment/Plan 1. Hyperglycemia A1c is stable.  Continue with healthy eating. - POC HgB A1c  2. Large breasts She would like to have breast reduction.  She has recently been losing weight, but breast size is actually increased a full cup.  Breasts cause neck pain, upper back pain. - Ambulatory referral to Plastic Surgery  3. Shortness of breath Not certain etiology for shortness of breath, but she does have some diminished lung sounds on exam.  She does feel like there is been slight improvement since starting prednisone yesterday.  She does have an elevated sed rate as well.  I am not sure the correlation.  I have asked her to complete the prednisone course and have given her a full 5 days of prednisone treatment.  I would like for her to update me after that time.  If inflammation is coming from something in the autoimmune realm, she should feel better with the prednisone.  She may have had some sort of reactive airway response to the Covid vaccine, and she does seem to be getting better, so I suspect that a course of prednisone may get her back to feeling normal.  Pulse ox maintained at 97% with walking around the office.  D-dimer was negative which decreases likelihood for any clot.  Consider further evaluation and work-up of pending patient update next  week.   Return for pending mychart update.  Spent 30 minutes in total with patient visit, exam, review of lab work and imaging, discussion of treatment plan and follow-up.  Micheline Rough, MD

## 2020-03-13 NOTE — Telephone Encounter (Signed)
Normal.  Please continue the same medication. I'll see you next time.

## 2020-03-13 NOTE — Telephone Encounter (Signed)
LAB RESULTS  Lab results were reviewed by Dr. Loanne Drilling. Called pt to inform about lab results as well as new orders. Using closed-loop communication, pt verbalized complete acceptance and understanding of all information provided. No further questions nor concerns were voiced at this time.

## 2020-03-13 NOTE — Telephone Encounter (Signed)
Please refer to message below and advise

## 2020-03-14 ENCOUNTER — Other Ambulatory Visit: Payer: Self-pay | Admitting: Family Medicine

## 2020-03-14 DIAGNOSIS — G4700 Insomnia, unspecified: Secondary | ICD-10-CM

## 2020-03-21 ENCOUNTER — Encounter: Payer: Self-pay | Admitting: Family Medicine

## 2020-03-21 ENCOUNTER — Other Ambulatory Visit: Payer: Self-pay | Admitting: Family Medicine

## 2020-03-21 MED ORDER — BUDESONIDE-FORMOTEROL FUMARATE 80-4.5 MCG/ACT IN AERO: 2.0000 | INHALATION_SPRAY | Freq: Two times a day (BID) | RESPIRATORY_TRACT | 2 refills | Status: DC

## 2020-03-30 ENCOUNTER — Ambulatory Visit
Admission: RE | Admit: 2020-03-30 | Discharge: 2020-03-30 | Disposition: A | Discharge status: Home / Self Care | Payer: 59 | Source: Ambulatory Visit | Attending: Family Medicine | Admitting: Family Medicine

## 2020-03-30 ENCOUNTER — Other Ambulatory Visit: Payer: Self-pay

## 2020-03-30 DIAGNOSIS — Z1239 Encounter for other screening for malignant neoplasm of breast: Secondary | ICD-10-CM

## 2020-03-30 NOTE — Progress Notes (Signed)
Office Visit Note  Patient: Ariel [name]             Date of Birth: Mar 09, 1961           MRN: [id number]             PCP: Caren Macadam, MD Referring: Caren Macadam, MD Visit Date: 04/13/2020 Occupation: _0 @  Subjective:  Pain in both hands.   History of Present Illness: Ariel [name] is a 59 y.o. female seen in consultation per request of her PCP.  According to the patient her symptoms a started about an year ago with stiffness in her bilateral hands.  The stiffness and discomfort in her hands persist.  At times her hands get cold.  She has noted some puffiness in her hands as well.  She states about 2 years ago she had right knee joint discomfort and she was seen by an orthopedic surgeon who initially diagnosed her with meniscal tear.  No surgery was performed.  She had cortisone injection in March 2021 and she was referred to physical therapy which was very expensive.  She has been trying to do exercises at home.  She states her knee joint has improved.  Activities of Daily Living:  Patient reports morning stiffness for  24 hours.   Patient Denies nocturnal pain.  Difficulty dressing/grooming: Denies Difficulty climbing stairs: Denies Difficulty getting out of chair: Denies Difficulty using hands for taps, buttons, cutlery, and/or writing: Reports  Review of Systems  Constitutional: Positive for fatigue. Negative for night sweats, weight gain and weight loss.  HENT: Positive for mouth dryness. Negative for mouth sores, trouble swallowing, trouble swallowing and nose dryness.   Eyes: Negative for pain, redness, itching, visual disturbance and dryness.  Respiratory: Negative for cough, shortness of breath and difficulty breathing.   Cardiovascular: Negative for chest pain, palpitations, hypertension, irregular heartbeat and swelling in legs/feet.  Gastrointestinal: Negative for blood in stool, constipation and diarrhea.  Endocrine: Negative for increased  urination.  Genitourinary: Negative for difficulty urinating, painful urination and vaginal dryness.  Musculoskeletal: Positive for arthralgias, joint pain and morning stiffness. Negative for joint swelling, myalgias, muscle weakness, muscle tenderness and myalgias.  Skin: Negative for color change, rash, hair loss, redness, skin tightness, ulcers and sensitivity to sunlight.  Allergic/Immunologic: Negative for susceptible to infections.  Neurological: Negative for dizziness, numbness, headaches, memory loss, night sweats and weakness.  Hematological: Negative for bruising/bleeding tendency and swollen glands.  Psychiatric/Behavioral: Negative for depressed mood, confusion and sleep disturbance. The patient is nervous/anxious.     PMFS History:  Patient Active Problem List   Diagnosis Date Noted  . Primary osteoarthritis of right knee 08/26/2019  . Left thyroid nodule 03/12/2019  . Right knee pain 12/01/2018  . CTS (carpal tunnel syndrome) 01/22/2018  . Mood disorder ([place]) 01/22/2018  . Hypertension 05/29/2017  . Hyperlipidemia 05/29/2017  . Hyperglycemia 05/29/2017  . Aortic atherosclerosis ([place]) 05/29/2017  . Goiter 04/24/2017    Past Medical History:  Diagnosis Date  . Chronic bronchitis ([place])   . Dyspnea   . Fibroids, intramural   . Goiter   . History of hypertension   . Hyperlipidemia   . Hypertension   . Hypothyroidism   . Osteopenia 01/2018   T score -1.3 FRAX 3.8% / 0.5%  . Thyroid disease   . Tobacco use 05/29/2017    Family History  Problem Relation Age of Onset  . Thyroid disease Maternal Aunt   . Hypertension Mother   .  Renal Disease Mother   . Diabetes Father   . Hypertension Father   . Renal Disease Father        on dialysis  . Stroke Maternal Grandmother   . Breast cancer Maternal Grandmother        diagnosed in her 33's  . Cancer Paternal Grandmother        ?  Marland Kitchen Healthy Son   . Healthy Daughter    Past Surgical History:  Procedure Laterality Date   . ABDOMINAL HYSTERECTOMY     07/2017-Dr [place] per patient; done for fibroids  . BUNIONECTOMY Right   . CARDIAC ELECTROPHYSIOLOGY STUDY AND ABLATION  2010   hx tachycardia   . LAPAROTOMY N/A 06/26/2017   Procedure: EXPLORATORY LAPAROTOMY;  Surgeon: Everitt Amber, MD;  Location: [facility];  Service: Gynecology;  Laterality: N/A;  . THYROID LOBECTOMY Left 06/18/2019   Procedure: LEFT THYROID LOBECTOMY;  Surgeon: Armandina Gemma, MD;  Location: [facility];  Service: General;  Laterality: Left;  . TONSILLECTOMY  1967  . 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 Christian, no church currenty      Lifestyle: "poor diet"; no exercise   Immunization History  Administered Date(s) Administered  . PFIZER SARS-COV-2 Vaccination 01/26/2020, 02/16/2020  . Tdap 06/12/2017     Objective: Vital Signs: BP 129/84 (BP Location: Right Arm, Patient Position: Sitting, Cuff Size: Normal)   Pulse 76   Resp 16   Ht 5' 3" (1.6 m)   Wt 159 lb (72.1 kg)   LMP 06/29/2017   BMI 28.17 kg/m    Physical Exam Vitals and nursing note reviewed.  Constitutional:      Appearance: She is well-developed.  HENT:     Head: Normocephalic and atraumatic.  Eyes:     Conjunctiva/sclera: Conjunctivae normal.  Cardiovascular:     Rate and Rhythm: Normal rate and regular rhythm.     Heart sounds: Normal heart sounds.  Pulmonary:     Effort: Pulmonary effort is normal.     Breath sounds: Normal breath sounds.  Abdominal:     General: Bowel sounds are normal.     Palpations: Abdomen is soft.  Musculoskeletal:     Cervical back: Normal range of motion.  Lymphadenopathy:     Cervical: No cervical adenopathy.  Skin:    General: Skin is warm and dry.     Capillary Refill: Capillary refill takes less than 2 seconds.  Neurological:     Mental Status: She is alert and  oriented to person, place, and time.  Psychiatric:        Behavior: Behavior normal.      Musculoskeletal Exam: C-spine thoracic and lumbar spine were in good range of motion.  Shoulder joints, elbow joints, wrist joints with good range of motion.  She has mild PIP and DIP thickening with no synovitis.  Hip joints, knee joints, ankles, MTPs and PIPs with good range of motion with no synovitis.  CDAI Exam: CDAI Score: -- Patient Global: --; Provider Global: -- Swollen: --; Tender: -- Joint Exam 04/13/2020   No joint exam has been documented for this visit   There is currently no information documented on the homunculus. Go to the Rheumatology activity and complete the homunculus joint exam.  Investigation: No additional findings.  Imaging: MM DIGITAL SCREENING BILATERAL  Result Date: 04/03/2020 CLINICAL DATA:  Screening. EXAM: DIGITAL SCREENING BILATERAL MAMMOGRAM WITH CAD COMPARISON:  Previous exam(s). ACR Breast Density Category b: There are scattered areas of fibroglandular density. FINDINGS: There are no findings suspicious for malignancy. Images were processed with CAD. IMPRESSION: No mammographic evidence of malignancy. A result letter of this screening mammogram will be mailed directly to the patient. RECOMMENDATION: Screening mammogram in one year. (Code:SM-B-01Y) BI-RADS CATEGORY  1: Negative. Electronically Signed   By: Lillia Mountain M.D.   On: 04/03/2020 10:59   XR Hand 2 View Left  Result Date: 04/13/2020 PIP and DIP narrowing was noted.  No MCP, intercarpal radiocarpal joint space narrowing was noted.  No erosive changes were noted. Impression: These findings are consistent with osteoarthritis of the hand.  XR Hand 2 View Right  Result Date: 04/13/2020 PIP and DIP narrowing was noted.  No MCP, intercarpal radiocarpal joint space narrowing was noted.  No erosive changes were noted. Impression: These findings are consistent with osteoarthritis of the hand.   Recent  Labs: Lab Results  Component Value Date   WBC 3.7 (L) 03/08/2020   HGB 12.4 03/08/2020   PLT 247.0 03/08/2020   NA 143 04/10/2020   K 3.4 (L) 04/10/2020   CL 104 04/10/2020   CO2 30 04/10/2020   GLUCOSE 92 04/10/2020   BUN 7 04/10/2020   CREATININE 0.83 04/10/2020   BILITOT 0.4 04/10/2020   ALKPHOS 108 04/10/2020   AST 27 04/10/2020   ALT 16 04/10/2020   PROT 6.2 04/10/2020   ALBUMIN 3.8 04/10/2020   CALCIUM 9.4 04/10/2020   GFRAA >60 06/15/2019    Speciality Comments: No specialty comments available.  Procedures:  No procedures performed Allergies: Patient has no known allergies.   Assessment / Plan:     Visit Diagnoses: Polyarthralgia -patient complains of pain and discomfort in her bilateral hands.  She states she had right knee joint meniscal tear in the past and she has intermittent discomfort in her right knee joint.  She has been followed by orthopedic surgeon.  None of the other joints are painful.  01/12/20: ANA 1:80NH, 1:80cytoplasmic, RF<14, ESR 21, CRP<1, TSH 4.77. 03/08/20: ESR 63, CK 93  Pain in both hands -she complains of pain and discomfort in her bilateral hands.  No synovitis was noted.  RF was negative.  Will obtain additional labs today.  Plan: XR Hand 2 View Right, XR Hand 2 View Left, x-ray of bilateral hands were consistent with mild osteoarthritis.  Cyclic citrul peptide antibody, IgG, 14-3-3 eta Protein.  Have given her a handout on exercises to strengthen muscles.  Joint protection was discussed.  Primary osteoarthritis of right knee-I reviewed her previous x-rays and MRI reports.  Which showed meniscal tear injury and osteoarthritis, chondromalacia patella.  She is currently not very symptomatic.  Positive ANA (antinuclear antibody) -she has low titer positive ANA but no clinical features of autoimmune disease.  She has elevated sedimentation rate.  We will obtain additional labs today.  Plan: CBC with Differential/Platelet, COMPLETE METABOLIC PANEL WITH  GFR, Sedimentation rate, Anti-scleroderma antibody, RNP Antibody, Anti-Smith antibody, Sjogrens syndrome-A extractable nuclear antibody, Sjogrens syndrome-B extractable nuclear antibody, Anti-DNA antibody, double-stranded, C3 and C4  Essential hypertension-her blood pressure is controlled.  History of hypothyroidism  History of hyperlipidemia  Anxiety  Orders: Orders Placed This Encounter  Procedures  . XR Hand 2 View Right  . XR Hand 2 View Left  . CBC with Differential/Platelet  . COMPLETE  METABOLIC PANEL WITH GFR  . Sedimentation rate  . Cyclic citrul peptide antibody, IgG  . 14-3-3 eta Protein  . Anti-scleroderma antibody  . RNP Antibody  . Anti-Smith antibody  . Sjogrens syndrome-A extractable nuclear antibody  . Sjogrens syndrome-B extractable nuclear antibody  . Anti-DNA antibody, double-stranded  . C3 and C4   No orders of the defined types were placed in this encounter.    Follow-Up Instructions: Return for joint pain, +ANA.   Bo Merino, MD  Note - This record has been created using Editor, commissioning.  Chart creation errors have been sought, but may not always  have been located. Such creation errors do not reflect on  the standard of medical care.

## 2020-03-31 ENCOUNTER — Telehealth: Payer: Self-pay | Admitting: *Deleted

## 2020-03-31 NOTE — Telephone Encounter (Signed)
-----   Message from Caren Macadam, MD sent at 03/31/2020 11:17 AM EDT ----- I am writing in response to message on paper denial for lung cancer screening.   It has been nearly a month since last visit. I do see that she has some follow ups coming with specialists, which is great, but I want to address the continued shortness of breath. I think it would be best for her to have an in office follow up if she is still having some shortness of breath. I would like to listen to her again and determine next best step. Since she doesn't qualify for screening lung CT, if she is still short of breath we should consider either lung function testing or discuss possible CT scan for further evaluation, but I would like to examine and discuss before ordering. Please set up follow up. If she feels breathing is much better at this point, we can continue symbicort and just do a 3 month follow up (which will be after seeing specialists).

## 2020-03-31 NOTE — Telephone Encounter (Signed)
Spoke with the pt and informed her of the message below.  Patient stated the inhaler is helping and she prefers to await 3 month follow up.  Appt scheduled and the pt agreed to return a call sooner if needed.

## 2020-04-07 ENCOUNTER — Other Ambulatory Visit: Payer: Self-pay

## 2020-04-10 ENCOUNTER — Other Ambulatory Visit: Payer: Self-pay

## 2020-04-10 ENCOUNTER — Other Ambulatory Visit (INDEPENDENT_AMBULATORY_CARE_PROVIDER_SITE_OTHER): Payer: 59

## 2020-04-10 DIAGNOSIS — E785 Hyperlipidemia, unspecified: Secondary | ICD-10-CM | POA: Diagnosis not present

## 2020-04-10 DIAGNOSIS — E041 Nontoxic single thyroid nodule: Secondary | ICD-10-CM | POA: Diagnosis not present

## 2020-04-10 LAB — COMPREHENSIVE METABOLIC PANEL
ALT: 16 U/L (ref 0–35)
AST: 27 U/L (ref 0–37)
Albumin: 3.8 g/dL (ref 3.5–5.2)
Alkaline Phosphatase: 108 U/L (ref 39–117)
BUN: 7 mg/dL (ref 6–23)
CO2: 30 mEq/L (ref 19–32)
Calcium: 9.4 mg/dL (ref 8.4–10.5)
Chloride: 104 mEq/L (ref 96–112)
Creatinine, Ser: 0.83 mg/dL (ref 0.40–1.20)
GFR: 85.66 mL/min (ref >60.00)
Glucose, Bld: 92 mg/dL (ref 70–99)
Potassium: 3.4 mEq/L — ABNORMAL LOW (ref 3.5–5.1)
Sodium: 143 mEq/L (ref 135–145)
Total Bilirubin: 0.4 mg/dL (ref 0.2–1.2)
Total Protein: 6.2 g/dL (ref 6.0–8.3)

## 2020-04-10 LAB — LIPID PANEL
Cholesterol: 179 mg/dL (ref 0–200)
HDL: 60.8 mg/dL (ref >39.00)
LDL Cholesterol: 106 mg/dL — ABNORMAL HIGH (ref 0–99)
NonHDL: 118.33
Total CHOL/HDL Ratio: 3
Triglycerides: 64 mg/dL (ref 0.0–149.0)
VLDL: 12.8 mg/dL (ref 0.0–40.0)

## 2020-04-10 LAB — TSH: TSH: 1.38 u[IU]/mL (ref 0.35–4.50)

## 2020-04-12 MED ORDER — POTASSIUM CHLORIDE ER 10 MEQ PO TBCR: 10.0000 meq | EXTENDED_RELEASE_TABLET | ORAL | 5 refills | Status: DC

## 2020-04-12 NOTE — Addendum Note (Signed)
Addended by: Agnes Lawrence on: 04/12/2020 04:52 PM   Modules accepted: Orders

## 2020-04-13 ENCOUNTER — Encounter: Payer: Self-pay | Admitting: Rheumatology

## 2020-04-13 ENCOUNTER — Ambulatory Visit: Payer: Self-pay

## 2020-04-13 ENCOUNTER — Ambulatory Visit: Payer: 59 | Admitting: Rheumatology

## 2020-04-13 ENCOUNTER — Other Ambulatory Visit: Payer: Self-pay

## 2020-04-13 VITALS — BP 129/84 | HR 76 | Resp 16 | Ht 63.0 in | Wt 159.0 lb

## 2020-04-13 DIAGNOSIS — M2550 Pain in unspecified joint: Secondary | ICD-10-CM | POA: Diagnosis not present

## 2020-04-13 DIAGNOSIS — M1711 Unilateral primary osteoarthritis, right knee: Secondary | ICD-10-CM | POA: Diagnosis not present

## 2020-04-13 DIAGNOSIS — M79641 Pain in right hand: Secondary | ICD-10-CM | POA: Diagnosis not present

## 2020-04-13 DIAGNOSIS — M79642 Pain in left hand: Secondary | ICD-10-CM

## 2020-04-13 DIAGNOSIS — Z8639 Personal history of other endocrine, nutritional and metabolic disease: Secondary | ICD-10-CM

## 2020-04-13 DIAGNOSIS — R768 Other specified abnormal immunological findings in serum: Secondary | ICD-10-CM

## 2020-04-13 DIAGNOSIS — I10 Essential (primary) hypertension: Secondary | ICD-10-CM

## 2020-04-13 DIAGNOSIS — F419 Anxiety disorder, unspecified: Secondary | ICD-10-CM

## 2020-04-13 NOTE — Patient Instructions (Signed)
Hand Exercises Hand exercises can be helpful for almost anyone. These exercises can strengthen the hands, improve flexibility and movement, and increase blood flow to the hands. These results can make work and daily tasks easier. Hand exercises can be especially helpful for people who have joint pain from arthritis or have nerve damage from overuse (carpal tunnel syndrome). These exercises can also help people who have injured a hand. Exercises Most of these hand exercises are gentle stretching and motion exercises. It is usually safe to do them often throughout the day. Warming up your hands before exercise may help to reduce stiffness. You can do this with gentle massage or by placing your hands in warm water for 10-15 minutes. It is normal to feel some stretching, pulling, tightness, or mild discomfort as you begin new exercises. This will gradually improve. Stop an exercise right away if you feel sudden, severe pain or your pain gets worse. Ask your health care provider which exercises are best for you. Knuckle bend or "claw" fist 1. Stand or sit with your arm, hand, and all five fingers pointed straight up. Make sure to keep your wrist straight during the exercise. 2. Gently bend your fingers down toward your palm until the tips of your fingers are touching the top of your palm. Keep your big knuckle straight and just bend the small knuckles in your fingers. 3. Hold this position for __________ seconds. 4. Straighten (extend) your fingers back to the starting position. Repeat this exercise 5-10 times with each hand. Full finger fist 1. Stand or sit with your arm, hand, and all five fingers pointed straight up. Make sure to keep your wrist straight during the exercise. 2. Gently bend your fingers into your palm until the tips of your fingers are touching the middle of your palm. 3. Hold this position for __________ seconds. 4. Extend your fingers back to the starting position, stretching every  joint fully. Repeat this exercise 5-10 times with each hand. Straight fist 1. Stand or sit with your arm, hand, and all five fingers pointed straight up. Make sure to keep your wrist straight during the exercise. 2. Gently bend your fingers at the big knuckle, where your fingers meet your hand, and the middle knuckle. Keep the knuckle at the tips of your fingers straight and try to touch the bottom of your palm. 3. Hold this position for __________ seconds. 4. Extend your fingers back to the starting position, stretching every joint fully. Repeat this exercise 5-10 times with each hand. Tabletop 1. Stand or sit with your arm, hand, and all five fingers pointed straight up. Make sure to keep your wrist straight during the exercise. 2. Gently bend your fingers at the big knuckle, where your fingers meet your hand, as far down as you can while keeping the small knuckles in your fingers straight. Think of forming a tabletop with your fingers. 3. Hold this position for __________ seconds. 4. Extend your fingers back to the starting position, stretching every joint fully. Repeat this exercise 5-10 times with each hand. Finger spread 1. Place your hand flat on a table with your palm facing down. Make sure your wrist stays straight as you do this exercise. 2. Spread your fingers and thumb apart from each other as far as you can until you feel a gentle stretch. Hold this position for __________ seconds. 3. Bring your fingers and thumb tight together again. Hold this position for __________ seconds. Repeat this exercise 5-10 times with each hand.   Making circles 1. Stand or sit with your arm, hand, and all five fingers pointed straight up. Make sure to keep your wrist straight during the exercise. 2. Make a circle by touching the tip of your thumb to the tip of your index finger. 3. Hold for __________ seconds. Then open your hand wide. 4. Repeat this motion with your thumb and each finger on your  hand. Repeat this exercise 5-10 times with each hand. Thumb motion 1. Sit with your forearm resting on a table and your wrist straight. Your thumb should be facing up toward the ceiling. Keep your fingers relaxed as you move your thumb. 2. Lift your thumb up as high as you can toward the ceiling. Hold for __________ seconds. 3. Bend your thumb across your palm as far as you can, reaching the tip of your thumb for the small finger (pinkie) side of your palm. Hold for __________ seconds. Repeat this exercise 5-10 times with each hand. Grip strengthening  1. Hold a stress ball or other soft ball in the middle of your hand. 2. Slowly increase the pressure, squeezing the ball as much as you can without causing pain. Think of bringing the tips of your fingers into the middle of your palm. All of your finger joints should bend when doing this exercise. 3. Hold your squeeze for __________ seconds, then relax. Repeat this exercise 5-10 times with each hand. Contact a health care provider if:  Your hand pain or discomfort gets much worse when you do an exercise.  Your hand pain or discomfort does not improve within 2 hours after you exercise. If you have any of these problems, stop doing these exercises right away. Do not do them again unless your health care provider says that you can. Get help right away if:  You develop sudden, severe hand pain or swelling. If this happens, stop doing these exercises right away. Do not do them again unless your health care provider says that you can. This information is not intended to replace advice given to you by your health care provider. Make sure you discuss any questions you have with your health care provider. Document Revised: 01/28/2019 Document Reviewed: 10/08/2018 Elsevier Patient Education  2020 [employer]  

## 2020-04-27 ENCOUNTER — Encounter: Payer: Self-pay | Admitting: Family Medicine

## 2020-04-28 ENCOUNTER — Ambulatory Visit: Payer: 59 | Admitting: Rheumatology

## 2020-04-28 MED ORDER — HYDROCHLOROTHIAZIDE 25 MG PO TABS: 25.0000 mg | ORAL_TABLET | Freq: Every day | ORAL | 1 refills | Status: DC

## 2020-04-28 MED ORDER — AMLODIPINE BESYLATE 5 MG PO TABS: 5.0000 mg | ORAL_TABLET | Freq: Every day | ORAL | 1 refills | Status: DC

## 2020-04-28 MED ORDER — SERTRALINE HCL 100 MG PO TABS
100.0000 mg | ORAL_TABLET | Freq: Every day | ORAL | 1 refills | Status: DC
Start: 2020-04-28 — End: 2021-01-05

## 2020-04-28 NOTE — Addendum Note (Signed)
Addended by: Caren Macadam on: 04/28/2020 05:19 PM   Modules accepted: Orders

## 2020-05-02 ENCOUNTER — Encounter: Payer: Self-pay | Admitting: Plastic Surgery

## 2020-05-02 ENCOUNTER — Other Ambulatory Visit: Payer: Self-pay

## 2020-05-02 ENCOUNTER — Ambulatory Visit: Payer: 59 | Admitting: Plastic Surgery

## 2020-05-02 DIAGNOSIS — M546 Pain in thoracic spine: Secondary | ICD-10-CM | POA: Diagnosis not present

## 2020-05-02 DIAGNOSIS — G8929 Other chronic pain: Secondary | ICD-10-CM

## 2020-05-02 DIAGNOSIS — M542 Cervicalgia: Secondary | ICD-10-CM | POA: Insufficient documentation

## 2020-05-02 DIAGNOSIS — N62 Hypertrophy of breast: Secondary | ICD-10-CM | POA: Insufficient documentation

## 2020-05-02 DIAGNOSIS — M549 Dorsalgia, unspecified: Secondary | ICD-10-CM | POA: Insufficient documentation

## 2020-05-02 NOTE — Progress Notes (Addendum)
Patient ID: Ariel [name], female    DOB: 1961/09/23, 59 y.o.   MRN: [id number]   Chief Complaint  Patient presents with  . Breast Problem    Mammary Hyperplasia: The patient is a 59 y.o. female with a history of mammary hyperplasia for several years.  She has extremely large breasts causing symptoms that include the following: Back pain in the upper and lower back, including neck pain. She pulls or pins her bra straps to provide better lift and relief of the pressure and pain. She notices relief by holding her breast up manually.  Her shoulder straps cause grooves and pain and pressure that requires padding for relief. Pain medication is sometimes required with motrin and tylenol.  Activities that are hindered by enlarged breasts include: exercise and running.  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  Her breasts are extremely large and fairly symmetric with the right side slightly larger.  She has hyperpigmentation of the inframammary area on both sides.  The sternal to nipple distance on the right is 37 cm and the left is 38 cm.  The IMF distance is 23 cm.  She is 5 feet 3 inches tall and weighs 159 pounds.  Preoperative bra size = 44DDD cup.  She would like to be around a D cup.  The estimated excess breast tissue to be removed at the time of surgery = should be around 645 grams on the left and 645 grams on the right.  Mammogram history: June 2021 and was negative.  She does not have a family history of breast cancer.  She does not have diabetes but has in the past been diagnosed with hypoglycemia, hyperlipidemia and hypertension.  These are all under control.  She does not have a family history of breast cancer.  She has not been to a chiropractor or physical therapist.  She has been to her primary care physician.  She quit smoking 1 year ago and has been tobacco free since.   Review of Systems    Constitutional: Positive for activity change. Negative for appetite change.  Eyes: Negative.   Respiratory: Negative for chest tightness and shortness of breath.   Cardiovascular: Negative for chest pain.  Gastrointestinal: Negative for abdominal pain.  Endocrine: Negative.   Genitourinary: Negative.   Musculoskeletal: Positive for back pain and neck pain.  Skin: Negative.   Hematological: Negative.   Psychiatric/Behavioral: Negative.     Past Medical History:  Diagnosis Date  . Chronic bronchitis ([place])   . Dyspnea   . Fibroids, intramural   . Goiter   . History of hypertension   . Hyperlipidemia   . Hypertension   . Hypothyroidism   . Osteopenia 01/2018   T score -1.3 FRAX 3.8% / 0.5%  . Thyroid disease   . Tobacco use 05/29/2017    Past Surgical History:  Procedure Laterality Date  . ABDOMINAL HYSTERECTOMY     07/2017-Dr [place] per patient; done for fibroids  . BUNIONECTOMY Right   . CARDIAC ELECTROPHYSIOLOGY STUDY AND ABLATION  2010   hx tachycardia   . LAPAROTOMY N/A 06/26/2017   Procedure: EXPLORATORY LAPAROTOMY;  Surgeon: Everitt Amber, MD;  Location: [facility];  Service: Gynecology;  Laterality: N/A;  . THYROID LOBECTOMY Left 06/18/2019   Procedure: LEFT THYROID LOBECTOMY;  Surgeon: Armandina Gemma, MD;  Location: [facility];  Service: General;  Laterality: Left;  .  TONSILLECTOMY  1967  . TUBAL LIGATION     X2, REVERSAL       Current Outpatient Medications:  .  amLODipine (NORVASC) 5 MG tablet, Take 1 tablet (5 mg total) by mouth daily., Disp: 90 tablet, Rfl: 1 .  atorvastatin (LIPITOR) 20 MG tablet, Take 1 tablet (20 mg total) by mouth daily., Disp: 90 tablet, Rfl: 1 .  budesonide-formoterol (SYMBICORT) 80-4.5 MCG/ACT inhaler, Inhale 2 puffs into the lungs in the morning and at bedtime., Disp: 1 Inhaler, Rfl: 2 .  hydrochlorothiazide (HYDRODIURIL) 25 MG tablet, Take 1 tablet (25 mg total) by mouth daily., Disp: 90 tablet, Rfl: 1 .  levothyroxine (SYNTHROID) 50  MCG tablet, Take 1 tablet (50 mcg total) by mouth daily., Disp: 90 tablet, Rfl: 3 .  Multiple Vitamin (MULTIVITAMIN) capsule, Take 1 capsule by mouth daily. Centrum silver, Disp: , Rfl:  .  potassium chloride (KLOR-CON) 10 MEQ tablet, Take 1 tablet (10 mEq total) by mouth 3 (three) times a week., Disp: 12 tablet, Rfl: 5 .  sertraline (ZOLOFT) 100 MG tablet, Take 1 tablet (100 mg total) by mouth daily., Disp: 90 tablet, Rfl: 1 .  traZODone (DESYREL) 50 MG tablet, TAKE 0.5-1 TABLETS (25-50 MG TOTAL) BY MOUTH AT BEDTIME AS NEEDED FOR SLEEP., Disp: 90 tablet, Rfl: 1 .  vitamin B-12 (CYANOCOBALAMIN) 500 MCG tablet, Take 500 mcg by mouth daily., Disp: , Rfl:  .  magnesium citrate solution, Take 296 mLs by mouth daily as needed (constipation). , Disp: , Rfl:    Objective:   Vitals:   05/02/20 0759  BP: (!) 141/95  Pulse: 81  Temp: (!) 97.1 F (36.2 C)  SpO2: 100%    Physical Exam Vitals and nursing note reviewed.  Constitutional:      Appearance: Normal appearance.  HENT:     Head: Normocephalic and atraumatic.  Eyes:     Extraocular Movements: Extraocular movements intact.  Cardiovascular:     Rate and Rhythm: Normal rate.     Pulses: Normal pulses.  Pulmonary:     Effort: Pulmonary effort is normal. No respiratory distress.  Abdominal:     General: Abdomen is flat.  Skin:    General: Skin is warm.     Capillary Refill: Capillary refill takes less than 2 seconds.  Neurological:     General: No focal deficit present.     Mental Status: She is alert and oriented to person, place, and time.  Psychiatric:        Mood and Affect: Mood normal.        Behavior: Behavior normal.        Thought Content: Thought content normal.     Assessment & Plan:  Chronic bilateral thoracic back pain - Plan: Ambulatory referral to Physical Therapy  Neck pain - Plan: Ambulatory referral to Physical Therapy  Symptomatic mammary hypertrophy  The patient is a very good candidate for a bilateral  breast reduction with lateral liposuction as a possibility.  I explained the location of the scars as a anchor.  She will likely have drain on each side that will stay in for a week or 2.  I recommend physical therapy because this would be very helpful for when she has the surgery being able to reposition her shoulders.  I have reviewed her mammogram report and it is negative.  We talked about increasing her protein and decreasing her carbohydrates between now and surgery.  We also discussed that there will be a change in her nipple  areola sensation and she will have scars.  I also explained that I cannot make her perky but I can definitely make her smaller and reposition the breast in a more natural location than they are now.  The patient acknowledged understanding and agreeing with this plan.  The mammogram was from March 30, 2020 and is confirmed to be negative.  [place], DO

## 2020-05-16 ENCOUNTER — Ambulatory Visit: Payer: 59 | Attending: Plastic Surgery | Admitting: Physical Therapy

## 2020-05-16 ENCOUNTER — Other Ambulatory Visit: Payer: Self-pay

## 2020-05-16 DIAGNOSIS — M25511 Pain in right shoulder: Secondary | ICD-10-CM | POA: Diagnosis present

## 2020-05-16 DIAGNOSIS — M25612 Stiffness of left shoulder, not elsewhere classified: Secondary | ICD-10-CM | POA: Insufficient documentation

## 2020-05-16 DIAGNOSIS — M25611 Stiffness of right shoulder, not elsewhere classified: Secondary | ICD-10-CM | POA: Diagnosis present

## 2020-05-16 DIAGNOSIS — R293 Abnormal posture: Secondary | ICD-10-CM | POA: Insufficient documentation

## 2020-05-16 DIAGNOSIS — G8929 Other chronic pain: Secondary | ICD-10-CM | POA: Diagnosis present

## 2020-05-16 DIAGNOSIS — M542 Cervicalgia: Secondary | ICD-10-CM | POA: Insufficient documentation

## 2020-05-16 DIAGNOSIS — M25512 Pain in left shoulder: Secondary | ICD-10-CM | POA: Diagnosis present

## 2020-05-17 NOTE — Therapy (Addendum)
[place], [place], [address] Phone: [phone]   Fax:  [phone]  Physical Therapy Evaluation and Discharge  Patient Details  Name: Ariel [name] MRN: [id number] Date of Birth: [birth year] Referring Provider (PT): Wallace Going, DO   Encounter Date: 05/16/2020   PT End of Session - 05/16/20 1700    Visit Number 1    Number of Visits 6    Date for PT Re-Evaluation 06/27/20    PT Start Time 1701    PT Stop Time 1740    PT Time Calculation (min) 39 min    Activity Tolerance Patient tolerated treatment well    Behavior During Therapy [facility] for tasks assessed/performed           Past Medical History:  Diagnosis Date  . Chronic bronchitis ([place])   . Dyspnea   . Fibroids, intramural   . Goiter   . History of hypertension   . Hyperlipidemia   . Hypertension   . Hypothyroidism   . Osteopenia 01/2018   T score -1.3 FRAX 3.8% / 0.5%  . Thyroid disease   . Tobacco use 05/29/2017    Past Surgical History:  Procedure Laterality Date  . ABDOMINAL HYSTERECTOMY     07/2017-Dr [place] per patient; done for fibroids  . BUNIONECTOMY Right   . CARDIAC ELECTROPHYSIOLOGY STUDY AND ABLATION  2010   hx tachycardia   . LAPAROTOMY N/A 06/26/2017   Procedure: EXPLORATORY LAPAROTOMY;  Surgeon: Everitt Amber, MD;  Location: [facility];  Service: Gynecology;  Laterality: N/A;  . THYROID LOBECTOMY Left 06/18/2019   Procedure: LEFT THYROID LOBECTOMY;  Surgeon: Armandina Gemma, MD;  Location: [facility];  Service: General;  Laterality: Left;  . TONSILLECTOMY  1967  . TUBAL LIGATION     X2, REVERSAL     There were no vitals filed for this visit.    Subjective Assessment - 05/16/20 1702    Subjective Pt reports she's here for posture. Pt reports most of pain is in her lower neck & shoulder. She notes decreased ROM with her shoulder due to bra straps digging in. Pt reports this limits her running/exercise and  sleeping.    Limitations Other (comment);Walking;Lifting   Steps   How long can you sit comfortably? ~2 hrs    How long can you stand comfortably? ~3 hrs    How long can you walk comfortably? ~30 minutes (just enough to get groceries)    Patient Stated Goals Improve ROM    Currently in Pain? Yes    Pain Score 6     Pain Location Shoulder    Pain Descriptors / Indicators Tightness    Pain Type Chronic pain    Pain Onset More than a month ago    Pain Frequency Constant              [facility] PT Assessment - 05/17/20 0001      Assessment   Medical Diagnosis M54.2 (ICD-10-CM) - Neck pain    Referring Provider (PT) Dilling[place], Loel Lofty, DO    Prior Therapy None      Balance Screen   Has the patient fallen in the past 6 months No      [place] residence      Prior Function   Level of Independence Independent    Vocation Full time employment    Vocation Requirements Sitting at desk      Observation/Other Assessments  Focus on Therapeutic Outcomes (FOTO)  n/a      Posture/Postural Control   Posture/Postural Control Postural limitations    Postural Limitations Rounded Shoulders;Forward head;Decreased thoracic kyphosis      AROM   Right/Left Shoulder --   Abduction, ER/IR, and horizontal add/abduction [facility]   Right Shoulder Flexion 70 Degrees    Left Shoulder Flexion 65 Degrees    Cervical Flexion WFL   slight pull   Cervical Extension WFL    Cervical - Right Side Bend WFL   slight pull   Cervical - Left Side Bend WFL   slight pull   Cervical - Right Rotation WFL    Cervical - Left Rotation [facility]    Thoracic Flexion WFL    Thoracic Extension [facility]    Thoracic - Right Rotation [facility]    Thoracic - Left Rotation [facility]      Strength   Right Shoulder Flexion 4-/5   Limited due to pain   Right Shoulder Extension 4-/5   Limited due to pain   Right Shoulder ABduction 5/5    Right Shoulder Internal Rotation 5/5    Right Shoulder External Rotation  5/5    Left Shoulder Flexion 4-/5   Limited due to pain   Left Shoulder Extension 4-/5   Limited due pain   Left Shoulder ABduction 5/5    Left Shoulder Internal Rotation 5/5    Left Shoulder External Rotation 5/5      Palpation   Spinal mobility hypomobile thoracic    Palpation comment TTP upper trap, levators, rhomboids, pec major & minor bilat                      Objective measurements completed on examination: See above findings.       [place] Adult PT Treatment/Exercise - 05/17/20 0001      Neck Exercises: Seated   Neck Retraction 10 reps      Shoulder Exercises: Supine   Other Supine Exercises Retraction x 10; painful when attempted in sitting      Shoulder Exercises: Standing   External Rotation Strengthening;Both;10 reps    Theraband Level (Shoulder External Rotation) Level 3 (Green)    Row Strengthening;Both;10 reps;Theraband    Theraband Level (Shoulder Row) Level 3 (Green)    Other Standing Exercises serratus anterior against wall x 10    Other Standing Exercises attempted low trap setting on wall; however, pt unable to perform      Shoulder Exercises: Stretch   Other Shoulder Stretches doorway stretch mid & high x 30 sec each      Neck Exercises: Stretches   Levator Stretch Right;Left;30 seconds                    PT Short Term Goals - 05/17/20 0829      PT SHORT TERM GOAL #1   Title Pt will be independent with HEP    Time 3    Period Weeks    Status New    Target Date 06/07/20      PT SHORT TERM GOAL #2   Title Pt will increase pain free shoulder ROM >100 deg to be able to reach overhead    Baseline ~70 deg bilat    Time 3    Period Weeks    Status New    Target Date 06/07/20      PT SHORT TERM GOAL #3   Title Pt will be able to maintain and self correct  herself for improved posture throughout the day for pain reduction to </=5/10    Baseline 6/10 pain, pt with rounded shoulders    Time 3    Period Weeks    Status New      Target Date 06/07/20      PT SHORT TERM GOAL #4   Title Pt will have improved shoulder flexion strength to at least 4/5 to lift 5 lbs overhead    Baseline 3+/5 bilat flexion strength    Time 3    Period Weeks    Status New    Target Date 06/07/20                     Plan - 05/16/20 1732    Clinical Impression Statement Pt is a 59 y/o F presenting to O[facility]T with c/o shoulder and thoracic pain due to mammary hypertrophy. Pt demonstrates pain with shoulder flexion ROM and hypomobile thoracic spine. Pt is tender and tight along upper traps, pecs, and rhomboids with very weak lower trap. Pt's deficits is affecting her ability to exercise, lift, work, and ascend/descend steps. Pt would benefit from therapy to address these issues to optimize her level of function.    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;Locomotion Level    Examination-Participation Restrictions Cleaning;Community Activity;Laundry;Shop;Yard Work    Stability/Clinical Decision Making Stable/Uncomplicated    Clinical Decision Making Low    Rehab Potential Good    PT Frequency 2x / week    PT Duration 3 weeks    PT Treatment/Interventions ADLs/Self Care Home Management;Biofeedback;Cryotherapy;Electrical Stimulation;Iontophoresis 48m/ml Dexamethasone;Moist Heat;Traction;Ultrasound;Stair training;Functional mobility training;Therapeutic activities;Therapeutic exercise;Neuromuscular re-education;Patient/family education;Manual techniques;Passive range of motion;Dry needling;Taping;Spinal Manipulations;Joint Manipulations    PT Next Visit Plan Assess response to HEP. Continue to progress thoracic/scapular strengthening. Focus on activating low trap if able. Manual therapy if indicated.    PT Home Exercise Plan Access Code [email or web address]   Consulted and Agree with Plan of Care  Patient           PHYSICAL THERAPY DISCHARGE SUMMARY  Visits from Start of Care: 1  Current functional level related to goals / functional outcomes: See above   Remaining deficits: See above. Pt to get breast reduction surgery.   Education / Equipment: See above. Pt no longer needing PT services and requesting d/c.  Plan: Patient agrees to discharge.  Patient goals were not met. Patient is being discharged due to the patient's request.  ?????       Patient will benefit from skilled therapeutic intervention in order to improve the following deficits and impairments:  Decreased range of motion, Increased fascial restricitons, Impaired UE functional use, Pain, Hypomobility, Impaired flexibility, Improper body mechanics, Decreased strength, Decreased mobility  Visit Diagnosis: Abnormal posture  Cervicalgia  Stiffness of left shoulder, not elsewhere classified  Stiffness of right shoulder, not elsewhere classified  Chronic left shoulder pain  Chronic right shoulder pain     Problem List Patient Active Problem List   Diagnosis Date Noted  . Back pain 05/02/2020  . Neck pain 05/02/2020  . Symptomatic mammary hypertrophy 05/02/2020  . Primary osteoarthritis of right knee 08/26/2019  . Left thyroid nodule 03/12/2019  . Right knee pain 12/01/2018  . CTS (carpal tunnel syndrome) 01/22/2018  . Mood disorder ([place] 01/22/2018  . Hypertension 05/29/2017  . Hyperlipidemia 05/29/2017  . Hyperglycemia 05/29/2017  . Aortic atherosclerosis ([place] 05/29/2017  . Goiter 04/24/2017  [facility] [address] PT, DPT 05/17/2020, 8:46 AM  [facility] [address] [place], [place], [address] Phone: [phone]   Fax:  [phone]  Name: Ariel [name] MRN: [id number] Date of Birth: May 28, 1961

## 2020-05-22 ENCOUNTER — Ambulatory Visit: Payer: 59 | Admitting: Rheumatology

## 2020-05-29 ENCOUNTER — Encounter: Payer: Self-pay | Admitting: Family Medicine

## 2020-05-29 ENCOUNTER — Ambulatory Visit: Payer: 59 | Admitting: Physical Therapy

## 2020-05-30 ENCOUNTER — Encounter: Payer: Self-pay | Admitting: Family Medicine

## 2020-05-30 ENCOUNTER — Telehealth (INDEPENDENT_AMBULATORY_CARE_PROVIDER_SITE_OTHER): Payer: 59 | Admitting: Family Medicine

## 2020-05-30 VITALS — Wt 160.0 lb

## 2020-05-30 DIAGNOSIS — R300 Dysuria: Secondary | ICD-10-CM | POA: Diagnosis not present

## 2020-05-30 MED ORDER — NITROFURANTOIN MONOHYD MACRO 100 MG PO CAPS
100.0000 mg | ORAL_CAPSULE | Freq: Two times a day (BID) | ORAL | 0 refills | Status: DC
Start: 2020-05-30 — End: 2020-08-25

## 2020-05-30 NOTE — Progress Notes (Signed)
Virtual Visit via Video Note  I connected with Ariel [name]  on 05/30/20 at 10:20 AM EDT by a video enabled telemedicine application and verified that I am speaking with the correct person using two identifiers.  Location patient: home, [place] Location provider:work or home office Persons participating in the virtual visit: patient, provider  I discussed the limitations of evaluation and management by telemedicine and the availability of in person appointments. The patient expressed understanding and agreed to proceed.   HPI:  Acute visit for dysuria: -started about 4 days ago -symptoms include discomfort with urination, frequency, urgency -feels similar to prior UTIs -has been over 1 year since last abx -denies: NVD, flank pain, vaginal discharge, gross hematuria, malaise, fatigue -denies abx allergies   ROS: See pertinent positives and negatives per HPI.  Past Medical History:  Diagnosis Date   Chronic bronchitis ([place])    Dyspnea    Fibroids, intramural    Goiter    History of hypertension    Hyperlipidemia    Hypertension    Hypothyroidism    Osteopenia 01/2018   T score -1.3 FRAX 3.8% / 0.5%   Thyroid disease    Tobacco use 05/29/2017    Past Surgical History:  Procedure Laterality Date   ABDOMINAL HYSTERECTOMY     07/2017-Dr [place] per patient; done for fibroids   BUNIONECTOMY Right    CARDIAC ELECTROPHYSIOLOGY STUDY AND ABLATION  2010   hx tachycardia    LAPAROTOMY N/A 06/26/2017   Procedure: EXPLORATORY LAPAROTOMY;  Surgeon: Everitt Amber, MD;  [facility]: [facility];  Service: Gynecology;  Laterality: N/A;   THYROID LOBECTOMY Left 06/18/2019   Procedure: LEFT THYROID LOBECTOMY;  Surgeon: Armandina Gemma, MD;  Location[facility];  Service: General;  Laterality: Left;   TONSILLECTOMY  1967   TUBAL LIGATION     X2, REVERSAL     Family History  Problem Relation Age of Onset   Thyroid disease Maternal Aunt    Hypertension Mother    Renal Disease  Mother    Diabetes Father    Hypertension Father    Renal Disease Father        on dialysis   Stroke Maternal Grandmother    Breast cancer Maternal Grandmother        diagnosed in her 47's   Cancer Paternal Grandmother        ?   Healthy Son    Healthy Daughter     SOCIAL HX: see hpi   Current Outpatient Medications:    amLODipine (NORVASC) 5 MG tablet, Take 1 tablet (5 mg total) by mouth daily., Disp: 90 tablet, Rfl: 1   atorvastatin (LIPITOR) 20 MG tablet, Take 1 tablet (20 mg total) by mouth daily., Disp: 90 tablet, Rfl: 1   budesonide-formoterol (SYMBICORT) 80-4.5 MCG/ACT inhaler, Inhale 2 puffs into the lungs in the morning and at bedtime., Disp: 1 Inhaler, Rfl: 2   hydrochlorothiazide (HYDRODIURIL) 25 MG tablet, Take 1 tablet (25 mg total) by mouth daily., Disp: 90 tablet, Rfl: 1   levothyroxine (SYNTHROID) 50 MCG tablet, Take 1 tablet (50 mcg total) by mouth daily., Disp: 90 tablet, Rfl: 3   Multiple Vitamin (MULTIVITAMIN) capsule, Take 1 capsule by mouth daily. Centrum silver, Disp: , Rfl:    potassium chloride (KLOR-CON) 10 MEQ tablet, Take 1 tablet (10 mEq total) by mouth 3 (three) times a week., Disp: 12 tablet, Rfl: 5   sertraline (ZOLOFT) 100 MG tablet, Take 1 tablet (100 mg total) by mouth daily., Disp: 90 tablet,  Rfl: 1   traZODone (DESYREL) 50 MG tablet, TAKE 0.5-1 TABLETS (25-50 MG TOTAL) BY MOUTH AT BEDTIME AS NEEDED FOR SLEEP., Disp: 90 tablet, Rfl: 1   vitamin B-12 (CYANOCOBALAMIN) 500 MCG tablet, Take 500 mcg by mouth daily., Disp: , Rfl:    nitrofurantoin, macrocrystal-monohydrate, (MACROBID) 100 MG capsule, Take 1 capsule (100 mg total) by mouth 2 (two) times daily., Disp: 14 capsule, Rfl: 0  EXAM:  VITALS per patient if applicable:  GENERAL: alert, oriented, appears well and in no acute distress  HEENT: atraumatic, conjunttiva clear, no obvious abnormalities on inspection of external nose and ears  NECK: normal movements of the head and  neck  LUNGS: on inspection no signs of respiratory distress, breathing rate appears normal, no obvious gross SOB, gasping or wheezing  CV: no obvious cyanosis  MS: moves all visible extremities without noticeable abnormality  PSYCH/NEURO: pleasant and cooperative, no obvious depression or anxiety, speech and thought processing grossly intact  ASSESSMENT AND PLAN:  Discussed the following assessment and plan:  Dysuria  -we discussed possible serious and likely etiologies, options for evaluation and workup, limitations of telemedicine visit vs in person visit, treatment, treatment risks and precautions. Pt prefers to treat via telemedicine empirically rather then risking or undertaking an in person visit at this moment. Query uncomplicated cystitis and she opted to try empiric treatment with MAcrobid 100mg bid x 7 days, sent to pharmacy. Preventive and symptomatic care discussed. Patient agrees to seek prompt in person care if worsening, new symptoms arise, or if is not improving with treatment.   I discussed the assessment and treatment plan with the patient. The patient was provided an opportunity to ask questions and all were answered. The patient agreed with the plan and demonstrated an understanding of the instructions.   The patient was advised to call back or seek an in-person evaluation if the symptoms worsen or if the condition fails to improve as anticipated.   Lucretia Kern, DO

## 2020-05-30 NOTE — Patient Instructions (Signed)
-  I sent the medication(s) we discussed to your pharmacy: °Meds ordered this encounter  °Medications  °• nitrofurantoin, macrocrystal-monohydrate, (MACROBID) 100 MG capsule  °  Sig: Take 1 capsule (100 mg total) by mouth 2 (two) times daily.  °  Dispense:  14 capsule  °  Refill:  0  ° ° °Please let us know if you have any questions or concerns regarding this prescription. ° °I hope you are feeling better soon! °Seek care promptly if your symptoms worsen, new concerns arise or you are not improving with treatment. ° °

## 2020-05-30 NOTE — Telephone Encounter (Signed)
Spoke with the Ariel [name] and informed her a visit is needed in this case and Dr Ethlyn Gallery is out of the office today.  Appt scheduled for a virtual visit with Dr Maudie Mercury at 10:20am.

## 2020-05-31 ENCOUNTER — Encounter: Payer: Self-pay | Admitting: Plastic Surgery

## 2020-06-02 ENCOUNTER — Ambulatory Visit: Payer: 59 | Admitting: Physical Therapy

## 2020-06-06 ENCOUNTER — Ambulatory Visit: Payer: 59 | Admitting: Physical Therapy

## 2020-06-08 ENCOUNTER — Ambulatory Visit: Payer: 59 | Admitting: Physical Therapy

## 2020-06-12 ENCOUNTER — Other Ambulatory Visit: Payer: Self-pay | Admitting: Family Medicine

## 2020-06-14 ENCOUNTER — Encounter: Payer: 59 | Admitting: Physical Therapy

## 2020-06-16 ENCOUNTER — Ambulatory Visit: Payer: 59 | Admitting: Physical Therapy

## 2020-07-03 ENCOUNTER — Other Ambulatory Visit: Payer: Self-pay | Admitting: Family Medicine

## 2020-07-03 DIAGNOSIS — F419 Anxiety disorder, unspecified: Secondary | ICD-10-CM

## 2020-07-07 ENCOUNTER — Ambulatory Visit: Payer: 59 | Admitting: Family Medicine

## 2020-07-10 ENCOUNTER — Other Ambulatory Visit: Payer: Self-pay | Admitting: Family Medicine

## 2020-07-14 NOTE — Progress Notes (Signed)
ICD-10-CM   1. Symptomatic mammary hypertrophy  N62   2. Chronic bilateral thoracic back pain  M54.6    G89.29   3. Neck pain  M54.2       Patient ID: Ariel [name], female    DOB: Mar 09, 1961, 59 y.o.   MRN: [id number]   History of Present Illness: Ariel [name] is a 59 y.o.  female  with a history of mammary hyperplasia.  She presents for preoperative evaluation for upcoming procedure, bilateral breast reduction with liposuction, scheduled for 08/03/2020 with Dr. Marla Roe.  Summary from previous visit: Patient's breasts are extremely large with the right side slightly larger.  Sternal to nipple distance on the right is 37 cm the left is 38 cm.  The IMF distance is 23 cm.  She is 5 feet 3 inches tall and weighs 159 pounds.  Preoperative bra size is 44 DDD and she would like to be around a D cup.  The estimated excess breast tissue to be removed at time of surgery is 645 g from each side.  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  PMH Significant for: Chronic bronchitis, dyspnea, goiter, HTN, HLD, hypothyroid No personal or family history of breast cancer  The patient has not had problems with anesthesia.   Past Medical History: Allergies: No Known Allergies  Current Medications:  Current Outpatient Medications:  .  amLODipine (NORVASC) 5 MG tablet, Take 1 tablet (5 mg total) by mouth daily., Disp: 90 tablet, Rfl: 1 .  atorvastatin (LIPITOR) 20 MG tablet, TAKE 1 TABLET BY MOUTH EVERY DAY, Disp: 90 tablet, Rfl: 1 .  hydrochlorothiazide (HYDRODIURIL) 25 MG tablet, Take 1 tablet (25 mg total) by mouth daily., Disp: 90 tablet, Rfl: 1 .  levothyroxine (SYNTHROID) 50 MCG tablet, Take 1 tablet (50 mcg total) by mouth daily., Disp: 90 tablet, Rfl: 3 .  Multiple Vitamin (MULTIVITAMIN) capsule, Take 1 capsule by mouth daily. Centrum silver, Disp: , Rfl:  .  nitrofurantoin, macrocrystal-monohydrate, (MACROBID) 100 MG capsule, Take 1 capsule (100 mg total) by  mouth 2 (two) times daily., Disp: 14 capsule, Rfl: 0 .  potassium chloride (KLOR-CON) 10 MEQ tablet, Take 1 tablet (10 mEq total) by mouth 3 (three) times a week., Disp: 12 tablet, Rfl: 5 .  sertraline (ZOLOFT) 100 MG tablet, Take 1 tablet (100 mg total) by mouth daily., Disp: 90 tablet, Rfl: 1 .  SYMBICORT 80-4.5 MCG/ACT inhaler, INHALE 2 PUFFS INTO THE LUNGS IN THE MORNING AND AT BEDTIME., Disp: 1 each, Rfl: 5 .  traZODone (DESYREL) 50 MG tablet, TAKE 0.5-1 TABLETS (25-50 MG TOTAL) BY MOUTH AT BEDTIME AS NEEDED FOR SLEEP., Disp: 90 tablet, Rfl: 1 .  vitamin B-12 (CYANOCOBALAMIN) 500 MCG tablet, Take 500 mcg by mouth daily., Disp: , Rfl:  .  acetaminophen (TYLENOL) 500 MG tablet, Take 1 tablet (500 mg total) by mouth every 6 (six) hours as needed. For use AFTER surgery, Disp: 30 tablet, Rfl: 0 .  cephALEXin (KEFLEX) 500 MG capsule, Take 1 capsule (500 mg total) by mouth 4 (four) times daily for 3 days. For use AFTER Surgery, Disp: 12 capsule, Rfl: 0 .  HYDROcodone-acetaminophen (NORCO) 5-325 MG tablet, Take 1 tablet by mouth every 8 (eight) hours as needed for up to 7 days for severe pain. For use AFTER Surgery, Disp: 21 tablet, Rfl: 0 .  ibuprofen (ADVIL) 600 MG tablet, Take 1 tablet (600 mg total) by mouth every 6 (six) hours as needed for mild pain or moderate  pain. For use AFTER surgery, Disp: 30 tablet, Rfl: 0 .  ondansetron (ZOFRAN) 4 MG tablet, Take 1 tablet (4 mg total) by mouth every 8 (eight) hours as needed for nausea or vomiting., Disp: 20 tablet, Rfl: 0 .  sertraline (ZOLOFT) 50 MG tablet, PHYSICAL EXAM DUE. TAKE 1 TABLET BY MOUTH EVERY DAY. PHYSICAL DUE., Disp: 90 tablet, Rfl: 0  Past Medical Problems: Past Medical History:  Diagnosis Date  . Chronic bronchitis ([place])   . Dyspnea   . Fibroids, intramural   . Goiter   . History of hypertension   . Hyperlipidemia   . Hypertension   . Hypothyroidism   . Osteopenia 01/2018   T score -1.3 FRAX 3.8% / 0.5%  . Thyroid disease   .  Tobacco use 05/29/2017    Past Surgical History: Past Surgical History:  Procedure Laterality Date  . ABDOMINAL HYSTERECTOMY     07/2017-Dr [place] per patient; done for fibroids  . BUNIONECTOMY Right   . CARDIAC ELECTROPHYSIOLOGY STUDY AND ABLATION  2010   hx tachycardia   . LAPAROTOMY N/A 06/26/2017   Procedure: EXPLORATORY LAPAROTOMY;  Surgeon: Everitt Amber, MD;  Location: [facility];  Service: Gynecology;  Laterality: N/A;  . THYROID LOBECTOMY Left 06/18/2019   Procedure: LEFT THYROID LOBECTOMY;  Surgeon: Armandina Gemma, MD;  Location: [facility];  Service: General;  Laterality: Left;  . TONSILLECTOMY  1967  . TUBAL LIGATION     X2, REVERSAL     Social History: Social History   Socioeconomic History  . Marital status: Single    Spouse name: Not on file  . Number of children: Not on file  . Years of education: Not on file  . Highest education level: Not on file  Occupational History  . Not on file  Tobacco Use  . Smoking status: Former Smoker    Packs/day: 0.25    Years: 30.00    Pack years: 7.50    Types: Cigarettes    Quit date: 06/17/2019    Years since quitting: 1.0  . Smokeless tobacco: Never Used  Vaping Use  . Vaping Use: Never used  Substance and Sexual Activity  . Alcohol use: Yes    Comment: OCC  . Drug use: No  . Sexual activity: Yes    Partners: Male    Comment: 1ST INTERCOURSE- 50, PARTNERS- 22, CURRENT PARTNER- 3.5 YRS   Other Topics Concern  . 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 Christian, no church currenty      Lifestyle: "poor diet"; no exercise   Social Determinants of Health   Financial Resource Strain:   . Difficulty of Paying Living Expenses: Not on file  Food Insecurity:   . Worried About Charity fundraiser in the Last Year: Not on file  . Ran Out of Food in the Last Year: Not on file  Transportation Needs:   . Lack of  Transportation (Medical): Not on file  . Lack of Transportation (Non-Medical): Not on file  Physical Activity:   . Days of Exercise per Week: Not on file  . Minutes of Exercise per Session: Not on file  Stress:   . Feeling of Stress : Not on file  Social Connections:   . Frequency of Communication with Friends and Family: Not on file  . Frequency of Social Gatherings with Friends and  Family: Not on file  . Attends Religious Services: Not on file  . Active Member of Clubs or Organizations: Not on file  . Attends Archivist Meetings: Not on file  . Marital Status: Not on file  Intimate Partner Violence:   . Fear of Current or Ex-Partner: Not on file  . Emotionally Abused: Not on file  . Physically Abused: Not on file  . Sexually Abused: Not on file    Family History: Family History  Problem Relation Age of Onset  . Thyroid disease Maternal Aunt   . Hypertension Mother   . Renal Disease Mother   . Diabetes Father   . Hypertension Father   . Renal Disease Father        on dialysis  . Stroke Maternal Grandmother   . Breast cancer Maternal Grandmother        diagnosed in her 50's  . Cancer Paternal Grandmother        ?  Marland Kitchen Healthy Son   . Healthy Daughter     Review of Systems: Review of Systems  Constitutional: Negative for chills and fever.  HENT: Negative for congestion and sore throat.   Respiratory: Negative for cough and shortness of breath.   Cardiovascular: Negative for chest pain and palpitations.  Gastrointestinal: Negative for abdominal pain, nausea and vomiting.  Musculoskeletal: Positive for back pain and neck pain. Negative for joint pain and myalgias.  Skin: Negative for itching and rash.    Physical Exam: Vital Signs BP 135/84 (BP Location: Left Arm, Patient Position: Sitting, Cuff Size: Large)   Pulse 78   Temp 97.9 F (36.6 C) (Oral)   Ht 5\' 3"  (1.6 m)   Wt 164 lb 3.2 oz (74.5 kg)   LMP 06/29/2017   SpO2 99%   BMI 29.09 kg/m    Physical Exam Vitals and nursing note reviewed.  Constitutional:      General: She is not in acute distress.    Appearance: Normal appearance. She is normal weight. She is not ill-appearing.  HENT:     Head: Normocephalic and atraumatic.  Eyes:     Extraocular Movements: Extraocular movements intact.  Cardiovascular:     Rate and Rhythm: Normal rate and regular rhythm.     Pulses: Normal pulses.     Heart sounds: Normal heart sounds.  Pulmonary:     Effort: Pulmonary effort is normal.     Breath sounds: Normal breath sounds. No wheezing, rhonchi or rales.  Abdominal:     General: Bowel sounds are normal.     Palpations: Abdomen is soft.  Musculoskeletal:        General: No swelling. Normal range of motion.     Cervical back: Normal range of motion.  Skin:    General: Skin is warm and dry.     Coloration: Skin is not pale.     Findings: No erythema or rash.  Neurological:     General: No focal deficit present.     Mental Status: She is alert and oriented to person, place, and time.  Psychiatric:        Mood and Affect: Mood normal.        Behavior: Behavior normal.        Thought Content: Thought content normal.        Judgment: Judgment normal.     Assessment/Plan:  Ms. Kage scheduled for bilateral breast reduction with liposuction with Dr. Marla Roe.  Risks, benefits, and alternatives of procedure discussed, questions answered  and consent obtained.    Patient is having blood work & PCP visit on 07/21/20.   Smoking Status: Non-smoker-quit over 1 year ago; Counseling Given? N/A Last Mammogram: 03/30/2020; Results: No findings suspicious for malignancy  Caprini Score: 4 Moderate; Risk Factors include: 59 year old female, BMI > 25, and length of planned surgery. Recommendation for mechanical or pharmacological prophylaxis during surgery. Encourage early ambulation.   Pictures obtained: 05/02/2020  Post-op Rx sent to pharmacy: Norco, Keflex, Zofran, IBU 600 mg,  Tylenol 500 mg  Patient was provided with the breast reduction risks and General Surgical Risk consent document and Pain Medication Agreement prior to their appointment.  They had adequate time to read through the risk consent documents and Pain Medication Agreement. We also discussed them in person together during this preop appointment. All of their questions were answered to their satisfaction.  Recommended calling if they have any further questions.  Risk consent form and Pain Medication Agreement to be scanned into patient's chart.  The risk that can be encountered with breast reduction were discussed and include the following but not limited to these:  Breast asymmetry, fluid accumulation, firmness of the breast, inability to breast feed, loss of nipple or areola, skin loss, decrease or no nipple sensation, fat necrosis of the breast tissue, bleeding, infection, healing delay.  There are risks of anesthesia, changes to skin sensation and injury to nerves or blood vessels.  The muscle can be temporarily or permanently injured.  You may have an allergic reaction to tape, suture, glue, blood products which can result in skin discoloration, swelling, pain, skin lesions, poor healing.  Any of these can lead to the need for revisonal surgery or stage procedures.  A reduction has potential to interfere with diagnostic procedures.  Nipple or breast piercing can increase risks of infection.  This procedure is best done when the breast is fully developed.  Changes in the breast will continue to occur over time.  Pregnancy can alter the outcomes of previous breast reduction surgery, weight gain and weigh loss can also effect the long term appearance.   The risks that can be encountered with and after liposuction were discussed and include the following but no limited to these:  Asymmetry, fluid accumulation, firmness of the area, fat necrosis with death of fat tissue, bleeding, infection, delayed healing,  anesthesia risks, skin sensation changes, injury to structures including nerves, blood vessels, and muscles which may be temporary or permanent, allergies to tape, suture materials and glues, blood products, topical preparations or injected agents, skin and contour irregularities, skin discoloration and swelling, deep vein thrombosis, cardiac and pulmonary complications, pain, which may persist, persistent pain, recurrence of the lesion, poor healing of the incision, possible need for revisional surgery or staged procedures. Thiere can also be persistent swelling, poor wound healing, rippling or loose skin, worsening of cellulite, swelling, and thermal burn or heat injury from ultrasound with the ultrasound-assisted lipoplasty technique. Any change in weight fluctuations can alter the outcome.  The [place] was signed into law in 2016 which includes the topic of electronic health records.  This provides immediate access to information in MyChart.  This includes consultation notes, operative notes, office notes, lab results and pathology reports.  If you have any questions about what you read please let us know at your next visit or call us at the office.  We are right here with you.   Electronically signed by: Threasa Heads, PA-C 07/18/2020 10:30 AM

## 2020-07-14 NOTE — H&P (View-Only) (Signed)
ICD-10-CM   1. Symptomatic mammary hypertrophy  N62   2. Chronic bilateral thoracic back pain  M54.6    G89.29   3. Neck pain  M54.2       Patient ID: Ariel [name], female    DOB: January 15, 1961, 59 y.o.   MRN: [id number]   History of Present Illness: Ariel [name] is a 59 y.o.  female  with a history of mammary hyperplasia.  She presents for preoperative evaluation for upcoming procedure, bilateral breast reduction with liposuction, scheduled for 08/03/2020 with Dr. Marla Roe.  Summary from previous visit: Patient's breasts are extremely large with the right side slightly larger.  Sternal to nipple distance on the right is 37 cm the left is 38 cm.  The IMF distance is 23 cm.  She is 5 feet 3 inches tall and weighs 159 pounds.  Preoperative bra size is 44 DDD and she would like to be around a D cup.  The estimated excess breast tissue to be removed at time of surgery is 645 g from each side.  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  PMH Significant for: Chronic bronchitis, dyspnea, goiter, HTN, HLD, hypothyroid No personal or family history of breast cancer  The patient has not had problems with anesthesia.   Past Medical History: Allergies: No Known Allergies  Current Medications:  Current Outpatient Medications:  .  amLODipine (NORVASC) 5 MG tablet, Take 1 tablet (5 mg total) by mouth daily., Disp: 90 tablet, Rfl: 1 .  atorvastatin (LIPITOR) 20 MG tablet, TAKE 1 TABLET BY MOUTH EVERY DAY, Disp: 90 tablet, Rfl: 1 .  hydrochlorothiazide (HYDRODIURIL) 25 MG tablet, Take 1 tablet (25 mg total) by mouth daily., Disp: 90 tablet, Rfl: 1 .  levothyroxine (SYNTHROID) 50 MCG tablet, Take 1 tablet (50 mcg total) by mouth daily., Disp: 90 tablet, Rfl: 3 .  Multiple Vitamin (MULTIVITAMIN) capsule, Take 1 capsule by mouth daily. Centrum silver, Disp: , Rfl:  .  nitrofurantoin, macrocrystal-monohydrate, (MACROBID) 100 MG capsule, Take 1 capsule (100 mg total) by  mouth 2 (two) times daily., Disp: 14 capsule, Rfl: 0 .  potassium chloride (KLOR-CON) 10 MEQ tablet, Take 1 tablet (10 mEq total) by mouth 3 (three) times a week., Disp: 12 tablet, Rfl: 5 .  sertraline (ZOLOFT) 100 MG tablet, Take 1 tablet (100 mg total) by mouth daily., Disp: 90 tablet, Rfl: 1 .  SYMBICORT 80-4.5 MCG/ACT inhaler, INHALE 2 PUFFS INTO THE LUNGS IN THE MORNING AND AT BEDTIME., Disp: 1 each, Rfl: 5 .  traZODone (DESYREL) 50 MG tablet, TAKE 0.5-1 TABLETS (25-50 MG TOTAL) BY MOUTH AT BEDTIME AS NEEDED FOR SLEEP., Disp: 90 tablet, Rfl: 1 .  vitamin B-12 (CYANOCOBALAMIN) 500 MCG tablet, Take 500 mcg by mouth daily., Disp: , Rfl:  .  acetaminophen (TYLENOL) 500 MG tablet, Take 1 tablet (500 mg total) by mouth every 6 (six) hours as needed. For use AFTER surgery, Disp: 30 tablet, Rfl: 0 .  cephALEXin (KEFLEX) 500 MG capsule, Take 1 capsule (500 mg total) by mouth 4 (four) times daily for 3 days. For use AFTER Surgery, Disp: 12 capsule, Rfl: 0 .  HYDROcodone-acetaminophen (NORCO) 5-325 MG tablet, Take 1 tablet by mouth every 8 (eight) hours as needed for up to 7 days for severe pain. For use AFTER Surgery, Disp: 21 tablet, Rfl: 0 .  ibuprofen (ADVIL) 600 MG tablet, Take 1 tablet (600 mg total) by mouth every 6 (six) hours as needed for mild pain or moderate  pain. For use AFTER surgery, Disp: 30 tablet, Rfl: 0 .  ondansetron (ZOFRAN) 4 MG tablet, Take 1 tablet (4 mg total) by mouth every 8 (eight) hours as needed for nausea or vomiting., Disp: 20 tablet, Rfl: 0 .  sertraline (ZOLOFT) 50 MG tablet, PHYSICAL EXAM DUE. TAKE 1 TABLET BY MOUTH EVERY DAY. PHYSICAL DUE., Disp: 90 tablet, Rfl: 0  Past Medical Problems: Past Medical History:  Diagnosis Date  . Chronic bronchitis ([place])   . Dyspnea   . Fibroids, intramural   . Goiter   . History of hypertension   . Hyperlipidemia   . Hypertension   . Hypothyroidism   . Osteopenia 01/2018   T score -1.3 FRAX 3.8% / 0.5%  . Thyroid disease   .  Tobacco use 05/29/2017    Past Surgical History: Past Surgical History:  Procedure Laterality Date  . ABDOMINAL HYSTERECTOMY     07/2017-Dr [place] per patient; done for fibroids  . BUNIONECTOMY Right   . CARDIAC ELECTROPHYSIOLOGY STUDY AND ABLATION  2010   hx tachycardia   . LAPAROTOMY N/A 06/26/2017   Procedure: EXPLORATORY LAPAROTOMY;  Surgeon: Everitt Amber, MD;  Location: [facility];  Service: Gynecology;  Laterality: N/A;  . THYROID LOBECTOMY Left 06/18/2019   Procedure: LEFT THYROID LOBECTOMY;  Surgeon: Armandina Gemma, MD;  Location: [facility];  Service: General;  Laterality: Left;  . TONSILLECTOMY  1967  . TUBAL LIGATION     X2, REVERSAL     Social History: Social History   Socioeconomic History  . Marital status: Single    Spouse name: Not on file  . Number of children: Not on file  . Years of education: Not on file  . Highest education level: Not on file  Occupational History  . Not on file  Tobacco Use  . Smoking status: Former Smoker    Packs/day: 0.25    Years: 30.00    Pack years: 7.50    Types: Cigarettes    Quit date: 06/17/2019    Years since quitting: 1.0  . Smokeless tobacco: Never Used  Vaping Use  . Vaping Use: Never used  Substance and Sexual Activity  . Alcohol use: Yes    Comment: OCC  . Drug use: No  . Sexual activity: Yes    Partners: Male    Comment: 1ST INTERCOURSE- 33, PARTNERS- 22, CURRENT PARTNER- 3.5 YRS   Other Topics Concern  . 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 Christian, no church currenty      Lifestyle: "poor diet"; no exercise   Social Determinants of Health   Financial Resource Strain:   . Difficulty of Paying Living Expenses: Not on file  Food Insecurity:   . Worried About Charity fundraiser in the Last Year: Not on file  . Ran Out of Food in the Last Year: Not on file  Transportation Needs:   . Lack of  Transportation (Medical): Not on file  . Lack of Transportation (Non-Medical): Not on file  Physical Activity:   . Days of Exercise per Week: Not on file  . Minutes of Exercise per Session: Not on file  Stress:   . Feeling of Stress : Not on file  Social Connections:   . Frequency of Communication with Friends and Family: Not on file  . Frequency of Social Gatherings with Friends and  Family: Not on file  . Attends Religious Services: Not on file  . Active Member of Clubs or Organizations: Not on file  . Attends Archivist Meetings: Not on file  . Marital Status: Not on file  Intimate Partner Violence:   . Fear of Current or Ex-Partner: Not on file  . Emotionally Abused: Not on file  . Physically Abused: Not on file  . Sexually Abused: Not on file    Family History: Family History  Problem Relation Age of Onset  . Thyroid disease Maternal Aunt   . Hypertension Mother   . Renal Disease Mother   . Diabetes Father   . Hypertension Father   . Renal Disease Father        on dialysis  . Stroke Maternal Grandmother   . Breast cancer Maternal Grandmother        diagnosed in her 29's  . Cancer Paternal Grandmother        ?  Marland Kitchen Healthy Son   . Healthy Daughter     Review of Systems: Review of Systems  Constitutional: Negative for chills and fever.  HENT: Negative for congestion and sore throat.   Respiratory: Negative for cough and shortness of breath.   Cardiovascular: Negative for chest pain and palpitations.  Gastrointestinal: Negative for abdominal pain, nausea and vomiting.  Musculoskeletal: Positive for back pain and neck pain. Negative for joint pain and myalgias.  Skin: Negative for itching and rash.    Physical Exam: Vital Signs BP 135/84 (BP Location: Left Arm, Patient Position: Sitting, Cuff Size: Large)   Pulse 78   Temp 97.9 F (36.6 C) (Oral)   Ht 5\' 3"  (1.6 m)   Wt 164 lb 3.2 oz (74.5 kg)   LMP 06/29/2017   SpO2 99%   BMI 29.09 kg/m    Physical Exam Vitals and nursing note reviewed.  Constitutional:      General: She is not in acute distress.    Appearance: Normal appearance. She is normal weight. She is not ill-appearing.  HENT:     Head: Normocephalic and atraumatic.  Eyes:     Extraocular Movements: Extraocular movements intact.  Cardiovascular:     Rate and Rhythm: Normal rate and regular rhythm.     Pulses: Normal pulses.     Heart sounds: Normal heart sounds.  Pulmonary:     Effort: Pulmonary effort is normal.     Breath sounds: Normal breath sounds. No wheezing, rhonchi or rales.  Abdominal:     General: Bowel sounds are normal.     Palpations: Abdomen is soft.  Musculoskeletal:        General: No swelling. Normal range of motion.     Cervical back: Normal range of motion.  Skin:    General: Skin is warm and dry.     Coloration: Skin is not pale.     Findings: No erythema or rash.  Neurological:     General: No focal deficit present.     Mental Status: She is alert and oriented to person, place, and time.  Psychiatric:        Mood and Affect: Mood normal.        Behavior: Behavior normal.        Thought Content: Thought content normal.        Judgment: Judgment normal.     Assessment/Plan:  Ms. Bossi scheduled for bilateral breast reduction with liposuction with Dr. Marla Roe.  Risks, benefits, and alternatives of procedure discussed, questions answered  and consent obtained.    Patient is having blood work & PCP visit on 07/21/20.   Smoking Status: Non-smoker-quit over 1 year ago; Counseling Given? N/A Last Mammogram: 03/30/2020; Results: No findings suspicious for malignancy  Caprini Score: 4 Moderate; Risk Factors include: 59 year old female, BMI > 25, and length of planned surgery. Recommendation for mechanical or pharmacological prophylaxis during surgery. Encourage early ambulation.   Pictures obtained: 05/02/2020  Post-op Rx sent to pharmacy: Norco, Keflex, Zofran, IBU 600 mg,  Tylenol 500 mg  Patient was provided with the breast reduction risks and General Surgical Risk consent document and Pain Medication Agreement prior to their appointment.  They had adequate time to read through the risk consent documents and Pain Medication Agreement. We also discussed them in person together during this preop appointment. All of their questions were answered to their satisfaction.  Recommended calling if they have any further questions.  Risk consent form and Pain Medication Agreement to be scanned into patient's chart.  The risk that can be encountered with breast reduction were discussed and include the following but not limited to these:  Breast asymmetry, fluid accumulation, firmness of the breast, inability to breast feed, loss of nipple or areola, skin loss, decrease or no nipple sensation, fat necrosis of the breast tissue, bleeding, infection, healing delay.  There are risks of anesthesia, changes to skin sensation and injury to nerves or blood vessels.  The muscle can be temporarily or permanently injured.  You may have an allergic reaction to tape, suture, glue, blood products which can result in skin discoloration, swelling, pain, skin lesions, poor healing.  Any of these can lead to the need for revisonal surgery or stage procedures.  A reduction has potential to interfere with diagnostic procedures.  Nipple or breast piercing can increase risks of infection.  This procedure is best done when the breast is fully developed.  Changes in the breast will continue to occur over time.  Pregnancy can alter the outcomes of previous breast reduction surgery, weight gain and weigh loss can also effect the long term appearance.   The risks that can be encountered with and after liposuction were discussed and include the following but no limited to these:  Asymmetry, fluid accumulation, firmness of the area, fat necrosis with death of fat tissue, bleeding, infection, delayed healing,  anesthesia risks, skin sensation changes, injury to structures including nerves, blood vessels, and muscles which may be temporary or permanent, allergies to tape, suture materials and glues, blood products, topical preparations or injected agents, skin and contour irregularities, skin discoloration and swelling, deep vein thrombosis, cardiac and pulmonary complications, pain, which may persist, persistent pain, recurrence of the lesion, poor healing of the incision, possible need for revisional surgery or staged procedures. Thiere can also be persistent swelling, poor wound healing, rippling or loose skin, worsening of cellulite, swelling, and thermal burn or heat injury from ultrasound with the ultrasound-assisted lipoplasty technique. Any change in weight fluctuations can alter the outcome.  The [place] was signed into law in 2016 which includes the topic of electronic health records.  This provides immediate access to information in MyChart.  This includes consultation notes, operative notes, office notes, lab results and pathology reports.  If you have any questions about what you read please let us know at your next visit or call us at the office.  We are right here with you.   Electronically signed by: Threasa Heads, PA-C 07/18/2020 10:30 AM

## 2020-07-18 ENCOUNTER — Other Ambulatory Visit: Payer: Self-pay

## 2020-07-18 ENCOUNTER — Encounter: Payer: Self-pay | Admitting: Plastic Surgery

## 2020-07-18 ENCOUNTER — Ambulatory Visit (INDEPENDENT_AMBULATORY_CARE_PROVIDER_SITE_OTHER): Payer: 59 | Admitting: Plastic Surgery

## 2020-07-18 VITALS — BP 135/84 | HR 78 | Temp 97.9°F | Ht 63.0 in | Wt 164.2 lb

## 2020-07-18 DIAGNOSIS — N62 Hypertrophy of breast: Secondary | ICD-10-CM

## 2020-07-18 DIAGNOSIS — G8929 Other chronic pain: Secondary | ICD-10-CM

## 2020-07-18 DIAGNOSIS — M542 Cervicalgia: Secondary | ICD-10-CM

## 2020-07-18 DIAGNOSIS — M546 Pain in thoracic spine: Secondary | ICD-10-CM

## 2020-07-18 MED ORDER — ONDANSETRON HCL 4 MG PO TABS: 4.0000 mg | ORAL_TABLET | Freq: Three times a day (TID) | ORAL | 0 refills | Status: DC | PRN

## 2020-07-18 MED ORDER — CEPHALEXIN 500 MG PO CAPS: 500.0000 mg | ORAL_CAPSULE | Freq: Four times a day (QID) | ORAL | 0 refills | Status: AC

## 2020-07-18 MED ORDER — HYDROCODONE-ACETAMINOPHEN 5-325 MG PO TABS: 1.0000 | ORAL_TABLET | Freq: Three times a day (TID) | ORAL | 0 refills | Status: AC | PRN

## 2020-07-18 MED ORDER — ACETAMINOPHEN 500 MG PO TABS
500.0000 mg | ORAL_TABLET | Freq: Four times a day (QID) | ORAL | 0 refills | Status: DC | PRN
Start: 2020-07-18 — End: 2020-11-24

## 2020-07-18 MED ORDER — IBUPROFEN 600 MG PO TABS
600.0000 mg | ORAL_TABLET | Freq: Four times a day (QID) | ORAL | 0 refills | Status: DC | PRN
Start: 2020-07-18 — End: 2020-11-24

## 2020-07-21 ENCOUNTER — Other Ambulatory Visit: Payer: Self-pay

## 2020-07-21 ENCOUNTER — Encounter: Payer: Self-pay | Admitting: Family Medicine

## 2020-07-21 ENCOUNTER — Ambulatory Visit (INDEPENDENT_AMBULATORY_CARE_PROVIDER_SITE_OTHER): Payer: 59

## 2020-07-21 ENCOUNTER — Ambulatory Visit (INDEPENDENT_AMBULATORY_CARE_PROVIDER_SITE_OTHER): Payer: 59 | Admitting: Family Medicine

## 2020-07-21 VITALS — BP 112/80 | HR 80 | Temp 97.9°F | Ht 63.0 in | Wt 167.7 lb

## 2020-07-21 DIAGNOSIS — M2550 Pain in unspecified joint: Secondary | ICD-10-CM

## 2020-07-21 DIAGNOSIS — E039 Hypothyroidism, unspecified: Secondary | ICD-10-CM

## 2020-07-21 DIAGNOSIS — F419 Anxiety disorder, unspecified: Secondary | ICD-10-CM | POA: Diagnosis not present

## 2020-07-21 DIAGNOSIS — R739 Hyperglycemia, unspecified: Secondary | ICD-10-CM | POA: Diagnosis not present

## 2020-07-21 DIAGNOSIS — E785 Hyperlipidemia, unspecified: Secondary | ICD-10-CM

## 2020-07-21 DIAGNOSIS — Z23 Encounter for immunization: Secondary | ICD-10-CM

## 2020-07-21 DIAGNOSIS — E876 Hypokalemia: Secondary | ICD-10-CM

## 2020-07-21 DIAGNOSIS — I10 Essential (primary) hypertension: Secondary | ICD-10-CM

## 2020-07-21 MED ORDER — FUROSEMIDE 20 MG PO TABS
20.0000 mg | ORAL_TABLET | Freq: Every day | ORAL | 1 refills | Status: DC | PRN
Start: 2020-07-21 — End: 2021-01-08

## 2020-07-21 NOTE — Progress Notes (Signed)
Ariel [name] DOB: 1961/01/10 Encounter date: 07/21/2020  This is a 59 y.o. female who presents with Chief Complaint  Patient presents with  . Follow-up    History of present illness: Last visit with me was May/2021.  At that time, we talked about some shortness of breath she was having.  Evaluation has been negative for this.  Notes breathing issues with physical exercise. No coughing. Comfortable with sitting. Gets winded going up flight of stairs. Additionally, she has met with Dr. Marla Roe to discuss breast reduction and is scheduled for surgery on 08/03/2020.  She feels that some of her breathing issues are secondary to weight gain she has had.  She saw Dr.Deveshwar in June for polyarthralgia, multiple labs were ordered at that visit, but I do not see that she complained of days. She didn't do the bloodwork due to med bills. She does feel like there is something going on internally. States that something is not right. Not specifically in one place, just body aching. Esp in left arm. Some days can't even abduct left shoulder. Some days can't raise it. Feels like something is not right in left lateral arm and feels like it causes weakness in left hand. Neck gets stiff from work, but nothing constant. Did feel much better after getting prednisone. But has days where she is aching and then will just get swelling.   She is back at work. Having hard time. Wondering if she can get ergonomic equipment to help her with pain. Would like ergonomic chair/keyboard and feels that this will be helpful for her.   PT was too pricey for her, but she did great after last knee injection from Dr. Delfino Lovett which was in March; just wearing off now.   Mood has been ok; but just feeling down about everything going on with her. Has taken 149m of the zoloft now.   Hypertension: Amlodipine 5 mg daily, hydrochlorothiazide 25 mg daily has had days where she is noticing more swelling in the lower legs and will take  an extra hydrochlorothiazide on those days.  This seems to help. Hyperlipidemia: Lipitor 20 mg daily Hypothyroid: 50 mcg Synthroid daily  No Known Allergies Current Meds  Medication Sig  . acetaminophen (TYLENOL) 500 MG tablet Take 1 tablet (500 mg total) by mouth every 6 (six) hours as needed. For use AFTER surgery  . amLODipine (NORVASC) 5 MG tablet Take 1 tablet (5 mg total) by mouth daily.  .Marland Kitchenatorvastatin (LIPITOR) 20 MG tablet TAKE 1 TABLET BY MOUTH EVERY DAY  . cephALEXin (KEFLEX) 500 MG capsule Take 1 capsule (500 mg total) by mouth 4 (four) times daily for 3 days. For use AFTER Surgery  . hydrochlorothiazide (HYDRODIURIL) 25 MG tablet Take 1 tablet (25 mg total) by mouth daily.  .Marland KitchenHYDROcodone-acetaminophen (NORCO) 5-325 MG tablet Take 1 tablet by mouth every 8 (eight) hours as needed for up to 7 days for severe pain. For use AFTER Surgery  . ibuprofen (ADVIL) 600 MG tablet Take 1 tablet (600 mg total) by mouth every 6 (six) hours as needed for mild pain or moderate pain. For use AFTER surgery  . levothyroxine (SYNTHROID) 50 MCG tablet Take 1 tablet (50 mcg total) by mouth daily.  . Multiple Vitamin (MULTIVITAMIN) capsule Take 1 capsule by mouth daily. Centrum silver  . nitrofurantoin, macrocrystal-monohydrate, (MACROBID) 100 MG capsule Take 1 capsule (100 mg total) by mouth 2 (two) times daily.  . ondansetron (ZOFRAN) 4 MG tablet Take 1 tablet (4 mg total)  by mouth every 8 (eight) hours as needed for nausea or vomiting.  . potassium chloride (KLOR-CON) 10 MEQ tablet Take 1 tablet (10 mEq total) by mouth 3 (three) times a week.  . sertraline (ZOLOFT) 100 MG tablet Take 1 tablet (100 mg total) by mouth daily.  . SYMBICORT 80-4.5 MCG/ACT inhaler INHALE 2 PUFFS INTO THE LUNGS IN THE MORNING AND AT BEDTIME.  . traZODone (DESYREL) 50 MG tablet TAKE 0.5-1 TABLETS (25-50 MG TOTAL) BY MOUTH AT BEDTIME AS NEEDED FOR SLEEP.  Marland Kitchen vitamin B-12 (CYANOCOBALAMIN) 500 MCG tablet Take 500 mcg by mouth  daily.  . [DISCONTINUED] sertraline (ZOLOFT) 50 MG tablet PHYSICAL EXAM DUE. TAKE 1 TABLET BY MOUTH EVERY DAY. PHYSICAL DUE.    Review of Systems  Constitutional: Negative for chills, fatigue and fever.  Respiratory: Negative for cough, chest tightness, shortness of breath and wheezing.   Cardiovascular: Negative for chest pain, palpitations and leg swelling.  Musculoskeletal: Positive for arthralgias.    Objective:  BP 112/80 (BP Location: Right Arm, Patient Position: Sitting, Cuff Size: Large)   Pulse 80   Temp 97.9 F (36.6 C) (Oral)   Ht 5' 3"  (1.6 m)   Wt 167 lb 11.2 oz (76.1 kg)   LMP 06/29/2017   BMI 29.71 kg/m   Weight: 167 lb 11.2 oz (76.1 kg)   BP Readings from Last 3 Encounters:  07/21/20 112/80  07/18/20 135/84  05/02/20 (!) 141/95   Wt Readings from Last 3 Encounters:  07/21/20 167 lb 11.2 oz (76.1 kg)  07/18/20 164 lb 3.2 oz (74.5 kg)  05/30/20 160 lb (72.6 kg)    Physical Exam Constitutional:      General: She is not in acute distress.    Appearance: She is well-developed.  Cardiovascular:     Rate and Rhythm: Normal rate and regular rhythm.     Heart sounds: Normal heart sounds. No murmur heard.  No friction rub.  Pulmonary:     Effort: Pulmonary effort is normal. No respiratory distress.     Breath sounds: Normal breath sounds. No wheezing or rales.  Musculoskeletal:     Right lower leg: No edema.     Left lower leg: No edema.     Comments: Initially, patient is able to abduct both arms equally and without discomfort, however on next attempt she experiences a sharp pain in the lateral aspect of the left shoulder.  This limits her ability to abduct the arm.  She has pain with abduction against resistance, making exam more difficult.  No tenderness with palpation of the shoulder.  Neurological:     Mental Status: She is alert and oriented to person, place, and time.  Psychiatric:        Behavior: Behavior normal.     Assessment/Plan  1. Primary  hypertension Blood pressures well controlled today.  Continue current medications: Amlodipine 5 mg, hydrochlorothiazide 25 mg.  We did add Lasix 20 mg for use as needed for lower extremity swelling. - CBC with Differential/Platelet; Future - CBC with Differential/Platelet  2. Hyperlipidemia, unspecified hyperlipidemia type Cholesterols been well controlled with Lipitor 20 mg.  Continue this.  3. Hyperglycemia She is motivated to work on regular exercise, which we discussed will help with blood sugar overall.  Sugar has been stable.  4. Anxiety Mood has been doing better.  Discussed importance of regular exercise.  Continue with Zoloft 100 mg.  5. Arthralgia, unspecified joint She is going to follow back up with rheumatology.  I am concerned  with ongoing joint discomfort.  She feels this is very different from her baseline and it is very debilitating for her when joints are flaring.  I do suspect she has some underlying autoimmune trigger. - Sedimentation rate; Future - DG Shoulder Left; Future - Sedimentation rate  6. Hypokalemia Continue with potassium supplementation.  We will plan to recheck labs today. - Basic metabolic panel; Future - Basic metabolic panel  7. Hypothyroidism, unspecified type Continue with current Synthroid 50 mcg daily dose.  She does follow with endocrinology. - TSH; Future - TSH  8. Need for immunization against influenza - Flu Vaccine QUAD 6+ mos PF IM (Fluarix Quad PF)   Return for pending lab.     Micheline Rough, MD

## 2020-07-22 LAB — CBC WITH DIFFERENTIAL/PLATELET
Absolute Monocytes: 283 cells/uL (ref 200–950)
Basophils Absolute: 41 cells/uL (ref 0–200)
Basophils Relative: 1 %
Eosinophils Absolute: 160 cells/uL (ref 15–500)
Eosinophils Relative: 3.9 %
HCT: 40.9 % (ref 35.0–45.0)
Hemoglobin: 13.6 g/dL (ref 11.7–15.5)
Lymphs Abs: 1677 cells/uL (ref 850–3900)
MCH: 30.5 pg (ref 27.0–33.0)
MCHC: 33.3 g/dL (ref 32.0–36.0)
MCV: 91.7 fL (ref 80.0–100.0)
MPV: 11.2 fL (ref 7.5–12.5)
Monocytes Relative: 6.9 %
Neutro Abs: 1939 cells/uL (ref 1500–7800)
Neutrophils Relative %: 47.3 %
Platelets: 225 10*3/uL (ref 140–400)
RBC: 4.46 10*6/uL (ref 3.80–5.10)
RDW: 14.4 % (ref 11.0–15.0)
Total Lymphocyte: 40.9 %
WBC: 4.1 10*3/uL (ref 3.8–10.8)

## 2020-07-22 LAB — BASIC METABOLIC PANEL
BUN: 10 mg/dL (ref 7–25)
CO2: 33 mmol/L — ABNORMAL HIGH (ref 20–32)
Calcium: 10.1 mg/dL (ref 8.6–10.4)
Chloride: 103 mmol/L (ref 98–110)
Creat: 0.77 mg/dL (ref 0.50–1.05)
Glucose, Bld: 91 mg/dL (ref 65–99)
Potassium: 4.3 mmol/L (ref 3.5–5.3)
Sodium: 142 mmol/L (ref 135–146)

## 2020-07-22 LAB — SEDIMENTATION RATE: Sed Rate: 17 mm/h (ref 0–30)

## 2020-07-22 LAB — TSH: TSH: 1.2 mIU/L (ref 0.40–4.50)

## 2020-07-26 ENCOUNTER — Ambulatory Visit: Payer: 59 | Admitting: Endocrinology

## 2020-07-27 ENCOUNTER — Encounter (HOSPITAL_BASED_OUTPATIENT_CLINIC_OR_DEPARTMENT_OTHER): Payer: Self-pay | Admitting: Plastic Surgery

## 2020-07-27 ENCOUNTER — Other Ambulatory Visit: Payer: Self-pay

## 2020-07-31 ENCOUNTER — Encounter (HOSPITAL_BASED_OUTPATIENT_CLINIC_OR_DEPARTMENT_OTHER)
Admission: RE | Admit: 2020-07-31 | Discharge: 2020-07-31 | Disposition: A | Discharge status: Home / Self Care | Payer: 59 | Source: Ambulatory Visit | Attending: Plastic Surgery | Admitting: Plastic Surgery

## 2020-07-31 ENCOUNTER — Other Ambulatory Visit (HOSPITAL_COMMUNITY)
Admission: RE | Admit: 2020-07-31 | Discharge: 2020-07-31 | Disposition: A | Discharge status: Home / Self Care | Payer: 59 | Source: Ambulatory Visit | Attending: Plastic Surgery | Admitting: Plastic Surgery

## 2020-07-31 DIAGNOSIS — Z01812 Encounter for preprocedural laboratory examination: Secondary | ICD-10-CM | POA: Insufficient documentation

## 2020-07-31 DIAGNOSIS — Z20822 Contact with and (suspected) exposure to covid-19: Secondary | ICD-10-CM | POA: Insufficient documentation

## 2020-07-31 LAB — SARS CORONAVIRUS 2 (TAT 6-24 HRS): SARS Coronavirus 2: NEGATIVE

## 2020-08-03 ENCOUNTER — Encounter (HOSPITAL_BASED_OUTPATIENT_CLINIC_OR_DEPARTMENT_OTHER): Payer: Self-pay | Admitting: Plastic Surgery

## 2020-08-03 ENCOUNTER — Encounter (HOSPITAL_BASED_OUTPATIENT_CLINIC_OR_DEPARTMENT_OTHER)
Admission: RE | Disposition: A | Discharge status: Home / Self Care | Payer: Self-pay | Source: Home / Self Care | Attending: Plastic Surgery

## 2020-08-03 ENCOUNTER — Ambulatory Visit (HOSPITAL_BASED_OUTPATIENT_CLINIC_OR_DEPARTMENT_OTHER): Payer: 59 | Admitting: Anesthesiology

## 2020-08-03 ENCOUNTER — Ambulatory Visit (HOSPITAL_BASED_OUTPATIENT_CLINIC_OR_DEPARTMENT_OTHER)
Admission: RE | Admit: 2020-08-03 | Discharge: 2020-08-03 | Disposition: A | Discharge status: Home / Self Care | Payer: 59 | Attending: Plastic Surgery | Admitting: Plastic Surgery

## 2020-08-03 ENCOUNTER — Other Ambulatory Visit: Payer: Self-pay

## 2020-08-03 DIAGNOSIS — E890 Postprocedural hypothyroidism: Secondary | ICD-10-CM | POA: Diagnosis not present

## 2020-08-03 DIAGNOSIS — M546 Pain in thoracic spine: Secondary | ICD-10-CM | POA: Diagnosis not present

## 2020-08-03 DIAGNOSIS — I10 Essential (primary) hypertension: Secondary | ICD-10-CM | POA: Insufficient documentation

## 2020-08-03 DIAGNOSIS — G8929 Other chronic pain: Secondary | ICD-10-CM | POA: Diagnosis not present

## 2020-08-03 DIAGNOSIS — G709 Myoneural disorder, unspecified: Secondary | ICD-10-CM | POA: Diagnosis not present

## 2020-08-03 DIAGNOSIS — M549 Dorsalgia, unspecified: Secondary | ICD-10-CM | POA: Insufficient documentation

## 2020-08-03 DIAGNOSIS — Z79890 Hormone replacement therapy (postmenopausal): Secondary | ICD-10-CM | POA: Insufficient documentation

## 2020-08-03 DIAGNOSIS — Z79899 Other long term (current) drug therapy: Secondary | ICD-10-CM | POA: Diagnosis not present

## 2020-08-03 DIAGNOSIS — M1990 Unspecified osteoarthritis, unspecified site: Secondary | ICD-10-CM | POA: Insufficient documentation

## 2020-08-03 DIAGNOSIS — E785 Hyperlipidemia, unspecified: Secondary | ICD-10-CM | POA: Diagnosis not present

## 2020-08-03 DIAGNOSIS — M542 Cervicalgia: Secondary | ICD-10-CM | POA: Diagnosis not present

## 2020-08-03 DIAGNOSIS — Z87891 Personal history of nicotine dependence: Secondary | ICD-10-CM | POA: Insufficient documentation

## 2020-08-03 DIAGNOSIS — M8580 Other specified disorders of bone density and structure, unspecified site: Secondary | ICD-10-CM | POA: Diagnosis not present

## 2020-08-03 DIAGNOSIS — N62 Hypertrophy of breast: Secondary | ICD-10-CM | POA: Insufficient documentation

## 2020-08-03 DIAGNOSIS — J42 Unspecified chronic bronchitis: Secondary | ICD-10-CM | POA: Diagnosis not present

## 2020-08-03 DIAGNOSIS — Z7951 Long term (current) use of inhaled steroids: Secondary | ICD-10-CM | POA: Insufficient documentation

## 2020-08-03 HISTORY — PX: BREAST REDUCTION SURGERY: SHX8

## 2020-08-03 SURGERY — BREAST REDUCTION WITH LIPOSUCTION
Anesthesia: General | Site: Breast | Laterality: Bilateral

## 2020-08-03 MED ORDER — ROCURONIUM BROMIDE 100 MG/10ML IV SOLN
INTRAVENOUS | Status: DC | PRN
  Administered 2020-08-03: 60 mg via INTRAVENOUS

## 2020-08-03 MED ORDER — LIDOCAINE-EPINEPHRINE 1 %-1:100000 IJ SOLN
INTRAMUSCULAR | Status: DC | PRN
  Administered 2020-08-03: 50 mL via INTRAMUSCULAR

## 2020-08-03 MED ORDER — ONDANSETRON HCL 4 MG/2ML IJ SOLN
INTRAMUSCULAR | Status: DC | PRN
  Administered 2020-08-03: 4 mg via INTRAVENOUS

## 2020-08-03 MED ORDER — FENTANYL CITRATE (PF) 100 MCG/2ML IJ SOLN
INTRAMUSCULAR | Status: AC
  Filled 2020-08-03: qty 2

## 2020-08-03 MED ORDER — SUGAMMADEX SODIUM 200 MG/2ML IV SOLN
INTRAVENOUS | Status: DC | PRN
  Administered 2020-08-03: 15100 mg via INTRAVENOUS

## 2020-08-03 MED ORDER — ACETAMINOPHEN 325 MG RE SUPP: 650.0000 mg | RECTAL | Status: DC | PRN

## 2020-08-03 MED ORDER — SODIUM CHLORIDE 0.9 % IV SOLN: 250.0000 mL | INTRAVENOUS | Status: DC | PRN

## 2020-08-03 MED ORDER — SODIUM CHLORIDE 0.9% FLUSH: 3.0000 mL | INTRAVENOUS | Status: DC | PRN

## 2020-08-03 MED ORDER — PHENYLEPHRINE HCL (PRESSORS) 10 MG/ML IV SOLN
INTRAVENOUS | Status: DC | PRN
  Administered 2020-08-03: 120 ug via INTRAVENOUS

## 2020-08-03 MED ORDER — LIDOCAINE HCL (CARDIAC) PF 100 MG/5ML IV SOSY
PREFILLED_SYRINGE | INTRAVENOUS | Status: DC | PRN
  Administered 2020-08-03: 60 mg via INTRAVENOUS

## 2020-08-03 MED ORDER — OXYCODONE HCL 5 MG/5ML PO SOLN: 5.0000 mg | Freq: Once | ORAL | Status: DC | PRN

## 2020-08-03 MED ORDER — FENTANYL CITRATE (PF) 100 MCG/2ML IJ SOLN
25.0000 ug | INTRAMUSCULAR | Status: DC | PRN
  Administered 2020-08-03: 50 ug via INTRAVENOUS

## 2020-08-03 MED ORDER — PROMETHAZINE HCL 25 MG/ML IJ SOLN
6.2500 mg | Freq: Once | INTRAMUSCULAR | Status: AC
  Administered 2020-08-03: 6.25 mg via INTRAVENOUS

## 2020-08-03 MED ORDER — ONDANSETRON HCL 4 MG/2ML IJ SOLN
INTRAMUSCULAR | Status: AC
  Filled 2020-08-03: qty 2

## 2020-08-03 MED ORDER — EPHEDRINE SULFATE 50 MG/ML IJ SOLN
INTRAMUSCULAR | Status: DC | PRN
  Administered 2020-08-03 (×3): 10 mg via INTRAVENOUS

## 2020-08-03 MED ORDER — OXYCODONE HCL 5 MG PO TABS: 5.0000 mg | ORAL_TABLET | ORAL | Status: DC | PRN

## 2020-08-03 MED ORDER — ONDANSETRON HCL 4 MG/2ML IJ SOLN: 4.0000 mg | Freq: Four times a day (QID) | INTRAMUSCULAR | Status: DC | PRN

## 2020-08-03 MED ORDER — FENTANYL CITRATE (PF) 100 MCG/2ML IJ SOLN
INTRAMUSCULAR | Status: DC | PRN
Start: 2020-08-03 — End: 2020-08-03
  Administered 2020-08-03: 50 ug via INTRAVENOUS
  Administered 2020-08-03: 100 ug via INTRAVENOUS
  Administered 2020-08-03 (×2): 50 ug via INTRAVENOUS

## 2020-08-03 MED ORDER — DEXAMETHASONE SODIUM PHOSPHATE 4 MG/ML IJ SOLN
INTRAMUSCULAR | Status: DC | PRN
  Administered 2020-08-03: 10 mg via INTRAVENOUS

## 2020-08-03 MED ORDER — DIPHENHYDRAMINE HCL 50 MG/ML IJ SOLN
INTRAMUSCULAR | Status: DC | PRN
  Administered 2020-08-03: 12.5 mg via INTRAVENOUS

## 2020-08-03 MED ORDER — MIDAZOLAM HCL 2 MG/2ML IJ SOLN
INTRAMUSCULAR | Status: AC
  Filled 2020-08-03: qty 2

## 2020-08-03 MED ORDER — CEFAZOLIN SODIUM-DEXTROSE 2-4 GM/100ML-% IV SOLN
INTRAVENOUS | Status: AC
  Filled 2020-08-03: qty 100

## 2020-08-03 MED ORDER — MIDAZOLAM HCL 5 MG/5ML IJ SOLN
INTRAMUSCULAR | Status: DC | PRN
  Administered 2020-08-03: 2 mg via INTRAVENOUS

## 2020-08-03 MED ORDER — OXYCODONE HCL 5 MG PO TABS: 5.0000 mg | ORAL_TABLET | Freq: Once | ORAL | Status: DC | PRN

## 2020-08-03 MED ORDER — DIPHENHYDRAMINE HCL 50 MG/ML IJ SOLN
INTRAMUSCULAR | Status: AC
  Filled 2020-08-03: qty 1

## 2020-08-03 MED ORDER — LIDOCAINE 2% (20 MG/ML) 5 ML SYRINGE
INTRAMUSCULAR | Status: AC
  Filled 2020-08-03: qty 5

## 2020-08-03 MED ORDER — CEFAZOLIN SODIUM-DEXTROSE 2-4 GM/100ML-% IV SOLN
2.0000 g | INTRAVENOUS | Status: AC
  Administered 2020-08-03: 2 g via INTRAVENOUS

## 2020-08-03 MED ORDER — SUCCINYLCHOLINE CHLORIDE 200 MG/10ML IV SOSY
PREFILLED_SYRINGE | INTRAVENOUS | Status: AC
  Filled 2020-08-03: qty 10

## 2020-08-03 MED ORDER — SODIUM CHLORIDE 0.9% FLUSH: 3.0000 mL | Freq: Two times a day (BID) | INTRAVENOUS | Status: DC

## 2020-08-03 MED ORDER — PROMETHAZINE HCL 25 MG/ML IJ SOLN
INTRAMUSCULAR | Status: AC
  Filled 2020-08-03: qty 1

## 2020-08-03 MED ORDER — PHENYLEPHRINE 40 MCG/ML (10ML) SYRINGE FOR IV PUSH (FOR BLOOD PRESSURE SUPPORT)
PREFILLED_SYRINGE | INTRAVENOUS | Status: AC
  Filled 2020-08-03: qty 10

## 2020-08-03 MED ORDER — LIDOCAINE HCL 1 % IJ SOLN
INTRAVENOUS | Status: DC | PRN
  Administered 2020-08-03: 250 mL

## 2020-08-03 MED ORDER — DEXAMETHASONE SODIUM PHOSPHATE 10 MG/ML IJ SOLN
INTRAMUSCULAR | Status: AC
  Filled 2020-08-03: qty 1

## 2020-08-03 MED ORDER — CHLORHEXIDINE GLUCONATE CLOTH 2 % EX PADS: 6.0000 | MEDICATED_PAD | Freq: Once | CUTANEOUS | Status: DC

## 2020-08-03 MED ORDER — PROPOFOL 10 MG/ML IV BOLUS
INTRAVENOUS | Status: DC | PRN
  Administered 2020-08-03: 150 mg via INTRAVENOUS

## 2020-08-03 MED ORDER — FENTANYL CITRATE (PF) 100 MCG/2ML IJ SOLN: 25.0000 ug | INTRAMUSCULAR | Status: DC | PRN

## 2020-08-03 MED ORDER — EPHEDRINE 5 MG/ML INJ
INTRAVENOUS | Status: AC
  Filled 2020-08-03: qty 10

## 2020-08-03 MED ORDER — PROPOFOL 500 MG/50ML IV EMUL
INTRAVENOUS | Status: AC
  Filled 2020-08-03: qty 50

## 2020-08-03 MED ORDER — ACETAMINOPHEN 325 MG PO TABS: 650.0000 mg | ORAL_TABLET | ORAL | Status: DC | PRN

## 2020-08-03 MED ORDER — LACTATED RINGERS IV SOLN: INTRAVENOUS | Status: DC

## 2020-08-03 SURGICAL SUPPLY — 65 items
ADH SKN CLS APL DERMABOND .7 (GAUZE/BANDAGES/DRESSINGS) ×4
BAG DECANTER FOR FLEXI CONT (MISCELLANEOUS) ×2 IMPLANT
BINDER BREAST LRG (GAUZE/BANDAGES/DRESSINGS) IMPLANT
BINDER BREAST MEDIUM (GAUZE/BANDAGES/DRESSINGS) IMPLANT
BINDER BREAST XLRG (GAUZE/BANDAGES/DRESSINGS) ×2 IMPLANT
BINDER BREAST XXLRG (GAUZE/BANDAGES/DRESSINGS) IMPLANT
BIOPATCH RED 1 DISK 7.0 (GAUZE/BANDAGES/DRESSINGS) ×4 IMPLANT
BLADE HEX COATED 2.75 (ELECTRODE) ×2 IMPLANT
BLADE KNIFE PERSONA 10 (BLADE) ×4 IMPLANT
BLADE SURG 15 STRL LF DISP TIS (BLADE) IMPLANT
BLADE SURG 15 STRL SS (BLADE)
BNDG GAUZE ELAST 4 BULKY (GAUZE/BANDAGES/DRESSINGS) IMPLANT
CANISTER SUCT 1200ML W/VALVE (MISCELLANEOUS) ×2 IMPLANT
COVER BACK TABLE 60X90IN (DRAPES) ×2 IMPLANT
COVER MAYO STAND STRL (DRAPES) ×2 IMPLANT
COVER WAND RF STERILE (DRAPES) IMPLANT
DECANTER SPIKE VIAL GLASS SM (MISCELLANEOUS) IMPLANT
DERMABOND ADVANCED (GAUZE/BANDAGES/DRESSINGS) ×4
DERMABOND ADVANCED .7 DNX12 (GAUZE/BANDAGES/DRESSINGS) ×4 IMPLANT
DRAIN CHANNEL 19F RND (DRAIN) ×4 IMPLANT
DRAPE LAPAROSCOPIC ABDOMINAL (DRAPES) ×2 IMPLANT
DRSG OPSITE POSTOP 4X6 (GAUZE/BANDAGES/DRESSINGS) ×8 IMPLANT
DRSG PAD ABDOMINAL 8X10 ST (GAUZE/BANDAGES/DRESSINGS) ×4 IMPLANT
ELECT BLADE 4.0 EZ CLEAN MEGAD (MISCELLANEOUS)
ELECT REM PT RETURN 9FT ADLT (ELECTROSURGICAL) ×2
ELECTRODE BLDE 4.0 EZ CLN MEGD (MISCELLANEOUS) IMPLANT
ELECTRODE REM PT RTRN 9FT ADLT (ELECTROSURGICAL) ×1 IMPLANT
EVACUATOR SILICONE 100CC (DRAIN) ×4 IMPLANT
GLOVE BIO SURGEON STRL SZ 6.5 (GLOVE) ×4 IMPLANT
GOWN STRL REUS W/ TWL LRG LVL3 (GOWN DISPOSABLE) ×2 IMPLANT
GOWN STRL REUS W/TWL LRG LVL3 (GOWN DISPOSABLE) ×4
NDL SAFETY ECLIPSE 18X1.5 (NEEDLE) IMPLANT
NEEDLE HYPO 18GX1.5 SHARP (NEEDLE)
NEEDLE HYPO 25X1 1.5 SAFETY (NEEDLE) ×2 IMPLANT
NS IRRIG 1000ML POUR BTL (IV SOLUTION) ×2 IMPLANT
PACK BASIN DAY SURGERY FS (CUSTOM PROCEDURE TRAY) ×2 IMPLANT
PAD ALCOHOL SWAB (MISCELLANEOUS) IMPLANT
PAD FOAM SILICONE BACKED (GAUZE/BANDAGES/DRESSINGS) IMPLANT
PENCIL SMOKE EVACUATOR (MISCELLANEOUS) ×2 IMPLANT
SLEEVE SCD COMPRESS KNEE MED (MISCELLANEOUS) ×2 IMPLANT
SPONGE LAP 18X18 RF (DISPOSABLE) ×10 IMPLANT
STRIP SUTURE WOUND CLOSURE 1/2 (MISCELLANEOUS) ×4 IMPLANT
SUT MNCRL AB 4-0 PS2 18 (SUTURE) ×20 IMPLANT
SUT MON AB 3-0 SH 27 (SUTURE) ×8
SUT MON AB 3-0 SH27 (SUTURE) ×4 IMPLANT
SUT MON AB 5-0 PS2 18 (SUTURE) ×4 IMPLANT
SUT PDS 3-0 CT2 (SUTURE)
SUT PDS AB 2-0 CT2 27 (SUTURE) IMPLANT
SUT PDS II 3-0 CT2 27 ABS (SUTURE) IMPLANT
SUT SILK 3 0 PS 1 (SUTURE) IMPLANT
SUT VIC AB 3-0 SH 27 (SUTURE)
SUT VIC AB 3-0 SH 27X BRD (SUTURE) IMPLANT
SUT VICRYL 4-0 PS2 18IN ABS (SUTURE) IMPLANT
SYR 3ML 23GX1 SAFETY (SYRINGE) ×2 IMPLANT
SYR 50ML LL SCALE MARK (SYRINGE) IMPLANT
SYR BULB IRRIG 60ML STRL (SYRINGE) ×2 IMPLANT
SYR CONTROL 10ML LL (SYRINGE) ×2 IMPLANT
TAPE MEASURE VINYL STERILE (MISCELLANEOUS) IMPLANT
TOWEL GREEN STERILE FF (TOWEL DISPOSABLE) ×4 IMPLANT
TRAY DSU PREP LF (CUSTOM PROCEDURE TRAY) ×2 IMPLANT
TUBE CONNECTING 20X1/4 (TUBING) ×2 IMPLANT
TUBING INFILTRATION IT-10001 (TUBING) IMPLANT
TUBING SET GRADUATE ASPIR 12FT (MISCELLANEOUS) IMPLANT
UNDERPAD 30X36 HEAVY ABSORB (UNDERPADS AND DIAPERS) ×4 IMPLANT
YANKAUER SUCT BULB TIP NO VENT (SUCTIONS) ×2 IMPLANT

## 2020-08-03 NOTE — Anesthesia Procedure Notes (Signed)
Procedure Name: Intubation Date/Time: 08/03/2020 7:39 AM Performed by: Willa Frater, CRNA Pre-anesthesia Checklist: Patient identified, Emergency Drugs available, Suction available and Patient being monitored Patient Re-evaluated:Patient Re-evaluated prior to induction Oxygen Delivery Method: Circle system utilized Preoxygenation: Pre-oxygenation with 100% oxygen Induction Type: IV induction Ventilation: Mask ventilation without difficulty Laryngoscope Size: Mac and 3 Grade View: Grade I Tube type: Oral Number of attempts: 1 Airway Equipment and Method: Stylet and Oral airway Placement Confirmation: ETT inserted through vocal cords under direct vision,  positive ETCO2 and breath sounds checked- equal and bilateral Secured at: 23 cm Tube secured with: Tape Dental Injury: Teeth and Oropharynx as per pre-operative assessment

## 2020-08-03 NOTE — Op Note (Signed)
Breast Reduction Op note:    DATE OF PROCEDURE: 08/03/2020  LOCATION: [place]  SURGEON: Lyndee Leo Sanger Aleksa Collinsworth, DO  ASSISTANT: Roetta Sessions, PA  PREOPERATIVE DIAGNOSIS 1. Macromastia 2. Neck Pain 3. Back Pain  POSTOPERATIVE DIAGNOSIS 1. Macromastia 2. Neck Pain 3. Back Pain  PROCEDURES 1. Bilateral breast reduction.  Right reduction 1577 g, Left reduction 3382 g  COMPLICATIONS: None.  DRAINS: bilateral #19 blake  INDICATIONS FOR PROCEDURE Ariel [name] is a 59 y.o. year-old female born on 1961-05-22,with a history of symptomatic macromastia with concominant back pain, neck pain, shoulder grooving from her bra.   MRN: [id number]  CONSENT Informed consent was obtained directly from the patient. The risks, benefits and alternatives were fully discussed. Specific risks including but not limited to bleeding, infection, hematoma, seroma, scarring, pain, nipple necrosis, asymmetry, poor cosmetic results, and need for further surgery were discussed. The patient had ample opportunity to have her questions answered to her satisfaction.  DESCRIPTION OF PROCEDURE  Patient was brought into the operating room and placed in a supine position.  SCDs were placed and appropriate padding was performed.  Antibiotics were given. The patient underwent general anesthesia and the chest was prepped and draped in a sterile fashion.  A timeout was performed and all information was confirmed to be correct. Tumescent was infused into the lower portion of the breast to aid with hemostasis.  Right side: Preoperative markings were confirmed.  Incision lines were injected with 1% Xylocaine with epinephrine.  After waiting for vasoconstriction, the marked lines were incised.  A Wise-pattern superomedial breast reduction was performed by de-epithelializing the pedicle, using bovie to create the superomedial pedicle, and removing breast tissue from the lateral and inferior  portions of the breast.  Care was taken to not undermine the breast pedicle. Hemostasis was achieved.  The nipple was gently rotated into position and the soft tissue closed with 4-0 Monocryl.   The pocket was irrigated and hemostasis confirmed.  The deep tissues were approximated with 3-0 Monocryl sutures and the skin was closed with deep dermal and subcuticular 4-0 Monocryl sutures.  The 5-0 Monocryl was used to close the skin as well. The nipple and skin flaps had good capillary refill at the end of the procedure.    Left side: Preoperative markings were confirmed.  Incision lines were injected with 1% Xylocaine with epinephrine.  After waiting for vasoconstriction, the marked lines were incised.  A Wise-pattern superomedial breast reduction was performed by de-epithelializing the pedicle, using bovie to create the superomedial pedicle, and removing breast tissue from the lateral, and inferior portions of the breast.  Care was taken to not undermine the breast pedicle. Hemostasis was achieved.  The nipple was gently rotated into position and the soft tissue was closed with 4-0 Monocryl.  The patient was sat upright and size and shape symmetry was confirmed.  The pocket was irrigated and hemostasis confirmed.  The deep tissues were approximated with 3-0 Monocryl sutures and the skin was closed with deep dermal and subcuticular 4-0 Monocryl sutures.  The 5-0 Monocryl was used to close the skin as well.  Dermabond was applied.  A breast binder and ABDs were placed.  The nipple and skin flaps had good capillary refill at the end of the procedure.  The patient tolerated the procedure well. The patient was allowed to wake from anesthesia and taken to the recovery room in satisfactory condition.  The advanced practice practitioner (APP) assisted throughout the case.  The APP  was essential in retraction and counter traction when needed to make the case progress smoothly.  This retraction and assistance made it  possible to see the tissue plans for the procedure.  The assistance was needed for blood control, tissue re-approximation and assisted with closure of the incision site.

## 2020-08-03 NOTE — Transfer of Care (Signed)
Immediate Anesthesia Transfer of Care Note  Patient: Ariel [name]  Procedure(s) Performed: BREAST REDUCTION WITH LIPOSUCTION (Bilateral Breast)  Patient Location: PACU  Anesthesia Type:General  Level of Consciousness: awake, alert , oriented, drowsy and patient cooperative  Airway & Oxygen Therapy: Patient Spontanous Breathing and Patient connected to face mask oxygen  Post-op Assessment: Report given to RN and Post -op Vital signs reviewed and stable  Post vital signs: Reviewed and stable  Last Vitals:  Vitals Value Taken Time  BP 138/76 08/03/20 1046  Temp    Pulse 82 08/03/20 1047  Resp 11 08/03/20 1047  SpO2 100 % 08/03/20 1047  Vitals shown include unvalidated device data.  Last Pain:  Vitals:   08/03/20 0702  TempSrc: Oral  PainSc: 0-No pain      Patients Stated Pain Goal: 6 (21/62/44 [id number])  Complications: No complications documented.

## 2020-08-03 NOTE — Anesthesia Postprocedure Evaluation (Signed)
Anesthesia Post Note  Patient: Ariel [name]  Procedure(s) Performed: BREAST REDUCTION WITH LIPOSUCTION (Bilateral Breast)     Patient location during evaluation: PACU Anesthesia Type: General Level of consciousness: awake and alert Pain management: pain level controlled Vital Signs Assessment: post-procedure vital signs reviewed and stable Respiratory status: spontaneous breathing, nonlabored ventilation, respiratory function stable and patient connected to nasal cannula oxygen Cardiovascular status: blood pressure returned to baseline and stable Postop Assessment: no apparent nausea or vomiting Anesthetic complications: no   No complications documented.  Last Vitals:  Vitals:   08/03/20 1159 08/03/20 1232  BP:  133/71  Pulse: 86 86  Resp: 17 16  Temp:  36.7 C  SpO2: 100% 95%    Last Pain:  Vitals:   08/03/20 1232  TempSrc:   PainSc: 0-No pain                 Mehki Klumpp S

## 2020-08-03 NOTE — Discharge Instructions (Addendum)
INSTRUCTIONS FOR AFTER SURGERY   You will likely have some questions about what to expect following your operation.  The following information will help you and your family understand what to expect when you are discharged from the hospital.  Following these guidelines will help ensure a smooth recovery and reduce risks of complications.  Postoperative instructions include information on: diet, wound care, medications and physical activity.  AFTER SURGERY Expect to go home after the procedure.  In some cases, you may need to spend one night in the hospital for observation.  DIET This surgery does not require a specific diet.  However, I have to mention that the healthier you eat the better your body can start healing. It is important to increasing your protein intake.  This means limiting the foods with added sugar.  Focus on fruits and vegetables and some meat. It is very important to drink water after your surgery.  If your urine is bright yellow, then it is concentrated, and you need to drink more water.  As a general rule after surgery, you should have 8 ounces of water every hour while awake.  If you find you are persistently nauseated or unable to take in liquids let us know.  NO TOBACCO USE or EXPOSURE.  This will slow your healing process and increase the risk of a wound.  WOUND CARE  If you have a drain: Clean with baby wipes for 5 days or until the drain is removed.   If you have steri-strips / tape directly attached to your skin leave them in place. It is OK to get these wet.  No baths, pools or hot tubs for two weeks. We close your incision to leave the smallest and best-looking scar. No ointment or creams on your incisions until given the go ahead.  Especially not Neosporin (Too many skin reactions with this one).  A few weeks after surgery you can use Mederma and start massaging the scar. We ask you to wear your binder or sports bra for the first 6 weeks around the clock, including while  sleeping. This provides added comfort and helps reduce the fluid accumulation at the surgery site.  ACTIVITY No heavy lifting until cleared by the doctor.  It is OK to walk and climb stairs. In fact, moving your legs is very important to decrease your risk of a blood clot.  It will also help keep you from getting deconditioned.  Every 1 to 2 hours get up and walk for 5 minutes. This will help with a quicker recovery back to normal.  Let pain be your guide so you don't do too much.  NO, you cannot do the spring cleaning and don't plan on taking care of anyone else.  This is your time for TLC.   WORK Everyone returns to work at different times. As a rough guide, most people take at least 1 - 2 weeks off prior to returning to work. If you need documentation for your job, bring the forms to your postoperative follow up visit.  DRIVING Arrange for someone to bring you home from the hospital.  You may be able to drive a few days after surgery but not while taking any narcotics or valium.  BOWEL MOVEMENTS Constipation can occur after anesthesia and while taking pain medication.  It is important to stay ahead for your comfort.  We recommend taking Milk of Magnesia (2 tablespoons; twice a day) while taking the pain pills.  SEROMA This is fluid your body  tried to put in the surgical site.  This is normal but if it creates excessive pain and swelling let us know.  It usually decreases in a few weeks.  MEDICATIONS and PAIN CONTROL At your preoperative visit for you history and physical you were given the following medications: 1. An antibiotic: Start this medication when you get home and take according to the instructions on the bottle. 2. Zofran 4 mg:  This is to treat nausea and vomiting.  You can take this every 6 hours as needed and only if needed. 3. Norco (hydrocodone/acetaminophen) 5/325 mg:  This is only to be used after you have taken the motrin or the tylenol. Every 8 hours as needed. Over the  counter Medication to take: 4. Ibuprofen (Motrin) 600 mg:  Take this every 6 hours.  If you have additional pain then take 500 mg of the tylenol.  Only take the Norco after you have tried these two. 5. Miralax or stool softener of choice: Take this according to the bottle if you take the [place] Call your surgeon's office if any of the following occur: . Fever 101 degrees F or greater . Excessive bleeding or fluid from the incision site. . Pain that increases over time without aid from the medications . Redness, warmth, or pus draining from incision sites . Persistent nausea or inability to take in liquids . Severe misshapen area that underwent the operation.   [facility] Surgery Specialist  What is the benefit of having a drain?  During surgery your tissue layers are separated.  This raw surface stimulates your body to fill the space with serous fluid.  This is normal but you don't want that fluid to collect and prevent healing.  A fluid collection can also become infected.  The Jackson-Pratt (JP) drain is used to eliminate this collection of fluid and allow the tissue to heal together.    Jackson-Pratt (JP) bulb    How to care for your drainage and suction unit at home Your drainage catheter will be connected to a collection device. The vacuum caused when the device is compressed allows drainage to collect in the device.    Wendee Copp your hands with soap and water before and after touching the system. . Empty the JP drain every 12 hours once you get home from your procedure. . Record the fluid amount on the record sheet included. . Start with stripping the drain tube to push the clots or excess fluid to the bulb.  Do this by pinching the tube with one hand near your skin.  Then with the other hand squeeze the tubing and work it toward the bulb.  This should be done several times a day.  This may collapse the tube which will correct on its own.   . Use a safety pin to attach  your collection device to your clothing so there is no tension on the insertion site.   . If you have drainage at the skin insertion site, you can apply a gauze dressing and secure it with tape. . If the drain falls out, apply a gauze dressing over the drain insertion site and secure with tape.   To empty the collection device:   . Release the stopper on the top of the collection unit (bulb).  Signa Kell contents into a measuring container such as a plastic medicine cup.  . Record the day and amount of drainage on the attached sheet. . This should be  done at least twice a day.    To compress the Jackson-Pratt Bulb:  . Release the stopper at the top of the bulb. Marland Kitchen Squeeze the bulb tightly in your fist, squeezing air out of the bulb.  . Replace the stopper while the bulb is compressed.  . Be careful not to spill the contents when squeezing the bulb. . The drainage will start bright red and turn to pink and then yellow with time. . IMPORTANT: If the bulb is not squeezed before adding the stopper it will not draw out the fluid.  Care for the JP drain site and your skin daily:  . You may shower three days after surgery. . Secure the drain to a ribbon or cloth around your waist while showering so it does not pull out while showering. . Be sure your hands are cleaned with soap and water. . Use a clean wet cotton swab to clean the skin around the drain site.  . Use another cotton swab to place Vaseline or antibiotic ointment on the skin around the drain.     Contact your physician if any of the following occur:  Marland Kitchen The fluid in the bulb becomes cloudy. . Your temperature is greater than 101.4.  Marland Kitchen The incision opens. . If you have drainage at the skin insertion site, you can apply a gauze dressing and secure it with tape. . If the drain falls out, apply a gauze dressing over the drain insertion site and secure with tape.  . You will usually have more drainage when you are active than while you rest  or are asleep. If the drainage increases significantly or is bloody call the physician                             Bring this record with you to each office visit Date  Drainage Volume  Date   Drainage volume                                                                                                                                                                                            Post Anesthesia Home Care Instructions  Activity: Get plenty of rest for the remainder of the day. A responsible individual must stay with you for 24 hours following the procedure.  For the next 24 hours, DO NOT: -Drive a car -Paediatric nurse -Drink alcoholic beverages -Take any medication unless instructed by your physician -Make any legal decisions or sign important papers.  Meals: Start with liquid foods such as gelatin or soup. Progress to regular foods as  tolerated. Avoid greasy, spicy, heavy foods. If nausea and/or vomiting occur, drink only clear liquids until the nausea and/or vomiting subsides. Call your physician if vomiting continues.  Special Instructions/Symptoms: Your throat may feel dry or sore from the anesthesia or the breathing tube placed in your throat during surgery. If this causes discomfort, gargle with warm salt water. The discomfort should disappear within 24 hours.  If you had a scopolamine patch placed behind your ear for the management of post- operative nausea and/or vomiting:  1. The medication in the patch is effective for 72 hours, after which it should be removed.  Wrap patch in a tissue and discard in the trash. Wash hands thoroughly with soap and water. 2. You may remove the patch earlier than 72 hours if you experience unpleasant side effects which may include dry mouth, dizziness or visual disturbances. 3. Avoid touching the patch. Wash your hands with soap and water after contact with the patch.

## 2020-08-03 NOTE — Anesthesia Preprocedure Evaluation (Signed)
Anesthesia Evaluation  Patient identified by MRN, date of birth, ID band Patient awake    Reviewed: Allergy & Precautions, H&P , NPO status , Patient's Chart, lab work & pertinent test results  Airway Mallampati: II   Neck ROM: full    Dental   Pulmonary Patient abstained from smoking., former smoker,    breath sounds clear to auscultation       Cardiovascular hypertension,  Rhythm:regular Rate:Normal     Neuro/Psych  Neuromuscular disease    GI/Hepatic   Endo/Other  Hypothyroidism   Renal/GU      Musculoskeletal  (+) Arthritis ,   Abdominal   Peds  Hematology   Anesthesia Other Findings   Reproductive/Obstetrics                             Anesthesia Physical Anesthesia Plan  ASA: II  Anesthesia Plan: General   Post-op Pain Management:    Induction: Intravenous  PONV Risk Score and Plan: 3 and Ondansetron, Dexamethasone, Midazolam and Treatment may vary due to age or medical condition  Airway Management Planned: Oral ETT  Additional Equipment:   Intra-op Plan:   Post-operative Plan: Extubation in OR  Informed Consent: I have reviewed the patients History and Physical, chart, labs and discussed the procedure including the risks, benefits and alternatives for the proposed anesthesia with the patient or authorized representative who has indicated his/her understanding and acceptance.       Plan Discussed with: CRNA, Anesthesiologist and Surgeon  Anesthesia Plan Comments:         Anesthesia Quick Evaluation

## 2020-08-03 NOTE — Interval H&P Note (Signed)
History and Physical Interval Note:  08/03/2020 7:04 AM  Ariel [name]  has presented today for surgery, with the diagnosis of mammary hypertrophy.  The various methods of treatment have been discussed with the patient and family. After consideration of risks, benefits and other options for treatment, the patient has consented to  Procedure(s) with comments: BREAST REDUCTION WITH LIPOSUCTION (Bilateral) - 3.5 hours as a surgical intervention.  The patient's history has been reviewed, patient examined, no change in status, stable for surgery.  I have reviewed the patient's chart and labs.  Questions were answered to the patient's satisfaction.     Loel Lofty Ariel [name]

## 2020-08-04 ENCOUNTER — Encounter (HOSPITAL_BASED_OUTPATIENT_CLINIC_OR_DEPARTMENT_OTHER): Payer: Self-pay | Admitting: Plastic Surgery

## 2020-08-04 LAB — SURGICAL PATHOLOGY

## 2020-08-07 ENCOUNTER — Telehealth: Payer: Self-pay

## 2020-08-07 NOTE — Telephone Encounter (Signed)
Patient is having concerns about taking a shower and would like to speak with someone about this.  Please call.

## 2020-08-07 NOTE — Telephone Encounter (Signed)
Returned patients call. Patient indicated she had drains from her bilateral breast surgery with lipo. Advised her she can use baby wipes or do a bird bath for 5 days. Replace soiled gauze only if needed, and keep binder on 24/7. Patient did not feel comfortable at this time and will wait for her PO follow up 08/11/20 at 8:00am.Patient understood and agreed.

## 2020-08-09 ENCOUNTER — Encounter: Payer: Self-pay | Admitting: Family Medicine

## 2020-08-11 ENCOUNTER — Ambulatory Visit (INDEPENDENT_AMBULATORY_CARE_PROVIDER_SITE_OTHER): Payer: 59 | Admitting: Plastic Surgery

## 2020-08-11 ENCOUNTER — Encounter: Payer: Self-pay | Admitting: Plastic Surgery

## 2020-08-11 ENCOUNTER — Other Ambulatory Visit: Payer: Self-pay

## 2020-08-11 VITALS — BP 108/72 | HR 92 | Temp 97.0°F

## 2020-08-11 DIAGNOSIS — N62 Hypertrophy of breast: Secondary | ICD-10-CM

## 2020-08-11 NOTE — Progress Notes (Signed)
Patient is a 59 year old female here for follow-up on her bilateral breast reduction.  She is a little bit sore.  She does not appear to have a hematoma or seroma.  Her drains are in place.  She misunderstood how to charge the drain.  Therefore I think we should leave it in over the weekend.  The patient is agreeable.  We went ahead and showed her how to charge the grenade.  I pushed a little bit but did not get any blood out.  She can go into a sports bra.  We will see her back on Tuesday.

## 2020-08-15 ENCOUNTER — Ambulatory Visit (INDEPENDENT_AMBULATORY_CARE_PROVIDER_SITE_OTHER): Payer: 59 | Admitting: Plastic Surgery

## 2020-08-15 ENCOUNTER — Encounter: Payer: Self-pay | Admitting: Plastic Surgery

## 2020-08-15 ENCOUNTER — Other Ambulatory Visit: Payer: Self-pay

## 2020-08-15 VITALS — BP 112/76 | HR 80 | Temp 98.0°F

## 2020-08-15 DIAGNOSIS — N62 Hypertrophy of breast: Secondary | ICD-10-CM

## 2020-08-15 NOTE — Progress Notes (Signed)
The patient is a 59 year old female here for follow-up after undergoing a bilateral breast reduction.  She had a little bit of a challenge with her drain tubes so we kept him in for the weekend.  Her drain output has been minimal.  She does not appear to have any seroma or hematoma.  I went ahead and removed the drains.  The dressings are still in place and we will likely take those off at the next visit.  The patient is very pleased with her outcome at this time.  She would like an extension on her return to work with a possible return date of 22 November.

## 2020-08-25 ENCOUNTER — Encounter: Payer: Self-pay | Admitting: Surgical

## 2020-08-25 ENCOUNTER — Ambulatory Visit (INDEPENDENT_AMBULATORY_CARE_PROVIDER_SITE_OTHER): Payer: 59 | Admitting: Surgical

## 2020-08-25 ENCOUNTER — Other Ambulatory Visit: Payer: Self-pay

## 2020-08-25 VITALS — BP 126/78 | HR 90 | Temp 98.3°F

## 2020-08-25 DIAGNOSIS — N62 Hypertrophy of breast: Secondary | ICD-10-CM

## 2020-08-25 NOTE — Progress Notes (Signed)
Patient is a 59 year old female here for follow-up after bilateral breast reduction with Dr. Marla Roe on 08/03/2020.  She is still having a lot of tenderness, reports that she has noticed a foul odor from her right breast drainage.  She has not had any fevers, chills, nausea, vomiting, chest pain.  She reports that home she has been taking it easy and avoiding any strenuous activities.  Chaperone present on exam On exam she has some incisional dehiscence at the junction of the vertical limb and inframammary fold on the right breast, some drainage from this.  She also has some drainage from bilateral NAC's.  She may have a little bit of nipple areolar necrosis on the right breast at approximately 6-8 o'clock.  It is difficult to definitively confirm as she has some Dermabond over the nipple areola which is limiting the exam.  There is some drainage from this area that does not appear purulent.  She does not have any erythema or cellulitic changes.  Bilateral breasts are very tender to palpation.  Bilateral breasts are firm, no fluid wave noted with palpation.   I was able to aspirate 50 cc of dark fluid from the right breast, patient tolerated this fine. I recommend continue her compressive garment. I recommend Vaseline and gauze to right breast inframammary fold wound and bilateral NAC's until she follows up with me in approximately 1 week. Pictures were obtained of the patient and placed in the chart with the patient's or guardian's permission. There is no sign of infection.

## 2020-09-05 ENCOUNTER — Other Ambulatory Visit: Payer: Self-pay

## 2020-09-05 ENCOUNTER — Ambulatory Visit (INDEPENDENT_AMBULATORY_CARE_PROVIDER_SITE_OTHER): Payer: 59 | Admitting: Surgical

## 2020-09-05 ENCOUNTER — Encounter: Payer: Self-pay | Admitting: Surgical

## 2020-09-05 VITALS — BP 149/94 | HR 85 | Temp 98.4°F

## 2020-09-05 DIAGNOSIS — N62 Hypertrophy of breast: Secondary | ICD-10-CM

## 2020-09-05 DIAGNOSIS — Z9889 Other specified postprocedural states: Secondary | ICD-10-CM

## 2020-09-05 MED ORDER — DOXYCYCLINE HYCLATE 100 MG PO TABS: 100.0000 mg | ORAL_TABLET | Freq: Two times a day (BID) | ORAL | 0 refills | Status: AC

## 2020-09-05 NOTE — Progress Notes (Addendum)
Patient is a 59 year old female here for follow-up after bilateral breast reduction with Dr. Marla Roe on 08/03/2020.  Patient had 1577 g removed from the right breast and 1526 g removed from the left breast She is 1 month postop today.  At her last visit it was noted that she had a little bit of nipple areolar necrosis on the right breast, however this was difficult to definitively confirm as she had some Dermabond covering the NAC limiting the exam.  Today she reports that she has noticed a foul odor from her right breast, she has been applying Vaseline twice per day to bilateral NAC's as well as the right breast inframammary fold wound.  She continues to have a lot of tenderness of the right breast and left breast.  She has continued to wear the compressive garments.  She reports a lot of drainage from the right breast NAC wound.  Chaperone present on exam On exam right breast NAC has had 70 to 80% necrosis.  The necrosis is full-thickness exposing necrotic tissue at the base.  I do not notice a foul odor on exam, however I had a surgical mask on.  Left breast NAC with approximately 20% necrosis, mostly in the 1-3 o'clock range.  Bilateral breasts are firm.  Bilateral breasts are tender to palpation.  No erythema or cellulitic changes noted.  Right breast inframammary fold wound is healing well with new epithelium noted.  There is no surrounding erythema or cellulitic changes noted in this area either.  I recommend Xeroform dressing changes daily to right breast NAC necrosis/wound.  Recommend Vaseline daily to left NAC.  I also recommend Vaseline daily to right inframammary fold wound.  I discussed with the patient that she has some necrosis of bilateral NAC's, I explained what this meant and what the healing process would look like for this.  I discussed with her that she should continue to wear compressive garment.  Due to her ongoing pain, the foul odor and drainage from the right breast we will keep  her out of work until her follow-up in 1 week and then we will reevaluate.  I discussed with the patient to call with any questions or concerns.  She seems understanding of the plan.  Pictures were obtained of the patient and placed in the chart with the patient's or guardian's permission.

## 2020-09-12 ENCOUNTER — Encounter: Payer: Self-pay | Admitting: Surgical

## 2020-09-17 DIAGNOSIS — Z9889 Other specified postprocedural states: Secondary | ICD-10-CM | POA: Insufficient documentation

## 2020-09-17 NOTE — Progress Notes (Deleted)
Patient is a 59 year old female here for follow-up after undergoing bilateral breast reduction with Dr. Marla Roe on 08/03/2020.  (Right breast: 1577 g; Left breast: 1526 g).  At last visit on 11/16, she had 70 to 80% necrosis of the right breast NAC and 20% necrosis of the left breast NAC.  ~ 7 weeks PO

## 2020-09-18 ENCOUNTER — Telehealth: Payer: Self-pay | Admitting: Plastic Surgery

## 2020-09-18 ENCOUNTER — Ambulatory Visit (INDEPENDENT_AMBULATORY_CARE_PROVIDER_SITE_OTHER): Payer: 59 | Admitting: Plastic Surgery

## 2020-09-18 ENCOUNTER — Other Ambulatory Visit: Payer: Self-pay

## 2020-09-18 ENCOUNTER — Encounter: Payer: Self-pay | Admitting: Plastic Surgery

## 2020-09-18 VITALS — BP 148/87 | HR 89 | Temp 99.3°F

## 2020-09-18 DIAGNOSIS — S21001D Unspecified open wound of right breast, subsequent encounter: Secondary | ICD-10-CM

## 2020-09-18 DIAGNOSIS — Z9889 Other specified postprocedural states: Secondary | ICD-10-CM

## 2020-09-18 DIAGNOSIS — S21002D Unspecified open wound of left breast, subsequent encounter: Secondary | ICD-10-CM

## 2020-09-18 MED ORDER — DOXYCYCLINE HYCLATE 100 MG PO TABS: 100.0000 mg | ORAL_TABLET | Freq: Two times a day (BID) | ORAL | 0 refills | Status: AC

## 2020-09-18 NOTE — Telephone Encounter (Signed)
Called and spoke with patient regarding the message below.  Informed her that I spoke with [facility] and she stated that she can continue with the Vaseline and gauze and come in Wed for her scheduled appointment or see can come in today at 2:20pm to be seen.  Patient stated that the areas are getting worse and she don't want to wait until Wednesday.  She stated that she would like to come in today.//AB/CMA

## 2020-09-18 NOTE — Progress Notes (Signed)
Patient is a 59 year old female here for follow-up after bilateral breast reduction with Dr. Marla Roe on 08/03/2020.  Patient had 1577 g removed from the right breast in 1526 g removed from the left breast.  At last visit on 11/16 she had 70 to 80% necrosis of the right breast NAC that was full-thickness and exposing necrotic tissue at the base.  She had about 20% necrosis on the left breast NAC and wound of the IMF near the vertical limb.  ~ 7 weeks PO Patient presents today with concern for increased wound size, increased drainage, and foul odor.  She has slightly elevated temperature in the office.  Upon examination she has 100% necrosis of the right breast NAC that is full-thickness.  She has approximately 30% necrosis of the left breast NAC.  The wound of the IMF near the vertical limb of the left breast has healed however an additional wound has opened just lateral to the vertical limb.  Erythema of the medial and inferior right breast.  Right breast is firm to the touch with foul odor from the wound.  Patient reports pain of bilateral wounds.  Continue dressing changes as needed (minimum of once daily) to right breast wound consisting of Xeroform and gauze or Mepilex border dressing.  Do daily dressing changes to the left breast wounds consisting of Vaseline and gauze.  7 days of doxycycline sent to the pharmacy.  Recommend patient work from home for the next few weeks until wounds stabilize.  Follow-up in 2 weeks.  Return precautions provided.  Call office with any questions/concerns.        Pictures were obtained of the patient and placed in the chart with the patient's or guardian's permission.

## 2020-09-18 NOTE — Telephone Encounter (Signed)
Patient called to advise that on her left breast at the vertical incision, there is some leaking. She also said that on the right breast, the nipple/areola is gone and there is a hole and she can see flesh. It's draining a clearish liquid with some blood mixed in. She has intermittent sharp pain in both breasts, but mostly the right. This has all occurred since Thanksgiving.  Please call to advise.

## 2020-09-19 ENCOUNTER — Other Ambulatory Visit: Payer: Self-pay

## 2020-09-19 ENCOUNTER — Telehealth: Payer: Self-pay | Admitting: *Deleted

## 2020-09-19 ENCOUNTER — Encounter: Payer: Self-pay | Admitting: Plastic Surgery

## 2020-09-19 ENCOUNTER — Ambulatory Visit (INDEPENDENT_AMBULATORY_CARE_PROVIDER_SITE_OTHER): Payer: 59 | Admitting: Plastic Surgery

## 2020-09-19 VITALS — BP 127/83 | HR 92 | Temp 99.5°F

## 2020-09-19 DIAGNOSIS — Z9889 Other specified postprocedural states: Secondary | ICD-10-CM

## 2020-09-19 NOTE — Progress Notes (Signed)
The patient is a 59 year old female here for follow-up on her breast reduction.  She was seen in the office yesterday and when I was cosigning the note I saw her picture.  She has loss of nipple areola on the right breast.  There is full-thickness tissue loss of the areola.  It does not appear to be infected.  Donated ACell powder initiate was applied.  She was given instructions using K-Y jelly daily for the next week.  I would like to see her back next week.

## 2020-09-19 NOTE — Telephone Encounter (Signed)
Faxed order to [facility] for supplies for the patient.  Confirmation received and copy scanned into the chart./AB/CMA  Supplies ordered:Mepilex border dressing (6x6)                              ABD pads                              Gauze (2x2)                              Adpatic                              Surgilube

## 2020-09-20 ENCOUNTER — Ambulatory Visit: Payer: 59 | Admitting: Plastic Surgery

## 2020-09-22 ENCOUNTER — Other Ambulatory Visit: Payer: Self-pay | Admitting: Family Medicine

## 2020-09-22 NOTE — Telephone Encounter (Signed)
Received on (09/19/20) via of fax Order Status Notification from [facility].  Stating:[facility] has provider service for the patient; no further action is required.//AB/CMA

## 2020-09-26 ENCOUNTER — Ambulatory Visit (INDEPENDENT_AMBULATORY_CARE_PROVIDER_SITE_OTHER): Payer: 59 | Admitting: Plastic Surgery

## 2020-09-26 ENCOUNTER — Encounter: Payer: Self-pay | Admitting: Plastic Surgery

## 2020-09-26 ENCOUNTER — Other Ambulatory Visit: Payer: Self-pay

## 2020-09-26 VITALS — BP 130/88 | HR 80 | Temp 97.5°F | Wt 164.0 lb

## 2020-09-26 DIAGNOSIS — N62 Hypertrophy of breast: Secondary | ICD-10-CM

## 2020-09-26 NOTE — Progress Notes (Signed)
The patient is a 59 year old female here for follow-up on her bilateral breast reduction.  She has loss of nipple areola on the right breast.  I was able to do debridement of some of the fibrous tissue.  Donated ACell sheet and powder was applied.  Patient is to do [place] dressing changes daily starting on Thursday.  We will see her back next week.

## 2020-09-27 ENCOUNTER — Other Ambulatory Visit: Payer: Self-pay | Admitting: Family Medicine

## 2020-09-27 DIAGNOSIS — F419 Anxiety disorder, unspecified: Secondary | ICD-10-CM

## 2020-10-03 ENCOUNTER — Other Ambulatory Visit: Payer: Self-pay

## 2020-10-03 ENCOUNTER — Encounter: Payer: Self-pay | Admitting: Plastic Surgery

## 2020-10-03 ENCOUNTER — Ambulatory Visit (INDEPENDENT_AMBULATORY_CARE_PROVIDER_SITE_OTHER): Payer: 59 | Admitting: Plastic Surgery

## 2020-10-03 ENCOUNTER — Telehealth: Payer: Self-pay

## 2020-10-03 VITALS — BP 130/86 | HR 87 | Temp 98.5°F

## 2020-10-03 DIAGNOSIS — S21001A Unspecified open wound of right breast, initial encounter: Secondary | ICD-10-CM

## 2020-10-03 DIAGNOSIS — S21001D Unspecified open wound of right breast, subsequent encounter: Secondary | ICD-10-CM

## 2020-10-03 DIAGNOSIS — Z9889 Other specified postprocedural states: Secondary | ICD-10-CM

## 2020-10-03 HISTORY — DX: Unspecified open wound of right breast, initial encounter: S21.001A

## 2020-10-03 NOTE — Progress Notes (Signed)
° °  Subjective:    Patient ID: Ariel [name], female    DOB: 1961-09-05, 59 y.o.   MRN: [id number]  The patient is a 59 year old very sweet lady who is following up for her breast reduction.  She had wound develop on the right breast.  She has been doing dressing changes at home.  She denies any vomiting or nausea.  She denies any chills or fevers.  There is quite a bit of drainage and she is managing that well with the dressing changes of K-Y jelly.  Donated ACell has been applied and it is getting smaller and less deep than it has been.  Today it is about 1 x 2 cm and 2.5 cm deep.  It does not appear infected.   Review of Systems  Constitutional: Negative.   HENT: Negative.   Eyes: Negative.   Respiratory: Negative.   Cardiovascular: Negative.   Genitourinary: Negative.        Objective:   Physical Exam Vitals and nursing note reviewed.  Constitutional:      Appearance: Normal appearance.  HENT:     Head: Normocephalic and atraumatic.  Cardiovascular:     Rate and Rhythm: Normal rate.     Pulses: Normal pulses.  Pulmonary:     Effort: Pulmonary effort is normal.  Chest:    Neurological:     General: No focal deficit present.     Mental Status: She is alert. Mental status is at baseline.  Psychiatric:        Mood and Affect: Mood normal.        Behavior: Behavior normal.         Assessment & Plan:     ICD-10-CM   1. S/P bilateral breast reduction  Z98.890   2. Open wound of right breast, subsequent encounter  S21.001D     Donated ACell was applied.  Patient will continue to do [facility] dressing changes daily.

## 2020-10-03 NOTE — Telephone Encounter (Signed)
Called [facility], spoke with Jonelle Sidle to order more supplies for patient. At this time the ABD pads, adaptic, surgical lube are maxed out from insurance until 10/09/20. They are able to send out only 30 more mepilex border foam "size 6x6" due to her insurance coverage. Explained to CSR that patient is having to piece together 3 mepilex border foam to cover her wound 3x's a day.   [facility] was able to send out today additional 2 supplies of the 2x2. Added Medipore tape 59M to her order.  Called patient, advised she will only be receiving the 2x2 gauze, mepilex (6x6) quantity of 30 and medipore tape. The other supplies have been maxed out until 10/09/20.  At that time she can order more supplies. Patient understood and agreed.

## 2020-10-16 ENCOUNTER — Encounter: Payer: Self-pay | Admitting: Surgical

## 2020-10-16 ENCOUNTER — Ambulatory Visit (INDEPENDENT_AMBULATORY_CARE_PROVIDER_SITE_OTHER): Payer: 59 | Admitting: Surgical

## 2020-10-16 ENCOUNTER — Other Ambulatory Visit: Payer: Self-pay

## 2020-10-16 ENCOUNTER — Telehealth: Payer: Self-pay | Admitting: Surgical

## 2020-10-16 VITALS — BP 128/87 | HR 77 | Temp 98.1°F

## 2020-10-16 DIAGNOSIS — S21001D Unspecified open wound of right breast, subsequent encounter: Secondary | ICD-10-CM

## 2020-10-16 DIAGNOSIS — Z9889 Other specified postprocedural states: Secondary | ICD-10-CM

## 2020-10-16 NOTE — Progress Notes (Signed)
Patient is a 59 year old female here for follow-up after bilateral breast reduction.  She has been seeing Dr. Marla Roe for treatment of her right breast wound.  She has been completing dressing changes at home. She was last seen on 10/03/2020 and donated ACell was applied to the right NAC wound.  Patient reports that she has noticed a fair amount of drainage from the right breast wound.  She reports that she is not having any infectious symptoms.  She reports that she has noticed some drainage from her left breast on the underside.  Chaperone present on exam On exam she has a pinhole wound along the vertical limb of the left breast.  There is no surrounding erythema.  There is no foul odor.  There is little bit of drainage that appears to be serosanguineous.  She has a wound of the right NAC that is approximately 2 x 1 x 3 cm.  Yellowish drainage noted.  No erythema noted.  No foul odor noted.  Granulation tissue noted throughout.  Plan:  Donated ACell was applied to the left breast wound.  Adaptic, K-Y jelly, 4 x 4 gauze and ABD pads were then applied.  Recommend continuing with Adaptic, 4 x 4 gauze, K-Y jelly and ABD pad dressing changes 1-2 times daily.  She reports that she has been having a lot of drainage which requires her to change it more than once per day.  We did discuss some other options for wound care including packing the wound to help decrease depth.  Plan to follow-up in 2 weeks for reevaluation.  No sign of infection, seroma, hematoma.  We have recommended the patient work from home due to her wound, will complete the necessary forms and have a tentative return to work date in 20 month pending improvement of the wound.

## 2020-10-16 NOTE — Telephone Encounter (Signed)
Ariel [name], [place], called because she received the forms from our office regarding estimated time for accommodations. After speaking with Phoebe Sharps, PA-C, advised Ariel, accommodations would be based on patient's rate of healing which is hard to estimate and her status is evaluated at each follow up visit. Ariel stated she will put 2 months and if patient is able to return to work with the accommodations we can update it at that time.

## 2020-11-01 ENCOUNTER — Other Ambulatory Visit: Payer: Self-pay

## 2020-11-01 ENCOUNTER — Ambulatory Visit (INDEPENDENT_AMBULATORY_CARE_PROVIDER_SITE_OTHER): Payer: 59 | Admitting: Surgical

## 2020-11-01 ENCOUNTER — Other Ambulatory Visit: Payer: Self-pay | Admitting: Family Medicine

## 2020-11-01 ENCOUNTER — Encounter: Payer: Self-pay | Admitting: Surgical

## 2020-11-01 VITALS — BP 134/86 | HR 77 | Temp 98.3°F

## 2020-11-01 DIAGNOSIS — Z9889 Other specified postprocedural states: Secondary | ICD-10-CM

## 2020-11-01 DIAGNOSIS — S21001D Unspecified open wound of right breast, subsequent encounter: Secondary | ICD-10-CM

## 2020-11-01 NOTE — Progress Notes (Signed)
Patient is a 60 year old female here for follow-up after bilateral breast reduction with Dr. Marla Roe on 08/03/2020.  She is 3 months postop today.  She has a wound develop on the right breast NAC and she has been doing dressing changes at home.  Donated ACell has been applied multiple times.  She reports today that she has noticed some drainage from her left breast around the nipple areola as well as along the front of her breast.  She has been applying a dressing to this to help collect the drainage.  She has continued with dressing changes to her right breast.  She has been having a lot of drainage from this area.  She is not having any infectious symptoms.  She does report increased pain in the left breast.  Patient reports that she has been having some difficulty dealing with the changes in her breasts and the difference in the sizes.  It has been causing her some emotional distress.  She reports that she is going to discuss this with her therapist.  Chaperone present on exam On exam left breast NAC is viable, she does have an opening in the incision along the left NAC incision at approximately 4-6 o'clock.  She has some yellowish exudate draining from this area.  There is no surrounding erythema or cellulitic changes.  No excessive warmth.  No foul odor noted.  She also has a pinpoint wound along the vertical limb of the left breast midway.  There is no surrounding erythema or cellulitic changes in this area either.  She does have fat necrosis of the left breast.  It is tender to palpation.  The right breast wound is stable, 2 x 1 x 3 cm.  Yellowish drainage noted, no surrounding erythema noted.  No foul odor is noted.  There has been no change in the depth of her wound since her previous visit.  Plan:  We discussed options for further treatment of the right and left breast wounds.  I recommend she apply Vaseline daily to the left breast wounds and cover with gauze and tape.   I recommend  packing the right breast wound with packing gauze daily.  I discussed with the patient that she can remove a small amount of the packing gauze every day and repack it every few days depending on the drainage.  I discussed with her that if she has a lot of drainage from the right breast wound that she can change it daily to prevent any excessive buildup of drainage.  I would like the patient to try this for 1 week prior to ordering supplies.  We can then place an order to present for her depending on if she is able to pack on her own.  I did discuss with the patient that I would consult with Dr. Marla Roe for further recommendations and input.   I discussed with the patient that speaking with her therapist would be helpful, I discussed with her that we are also available for any questions or concerns.  I recommend she calls [place] with any questions or concerns.  I would like to see her back in 2 weeks for reevaluation.  I do not see any signs of infection.  I recommend she call me if she has any changes in her symptoms or notices any infectious concerns.

## 2020-11-14 ENCOUNTER — Ambulatory Visit: Payer: 59 | Admitting: Plastic Surgery

## 2020-11-15 ENCOUNTER — Ambulatory Visit: Payer: 59 | Admitting: Surgical

## 2020-11-19 ENCOUNTER — Encounter (INDEPENDENT_AMBULATORY_CARE_PROVIDER_SITE_OTHER): Payer: Self-pay

## 2020-11-19 ENCOUNTER — Encounter: Payer: Self-pay | Admitting: Plastic Surgery

## 2020-11-19 ENCOUNTER — Encounter: Payer: Self-pay | Admitting: Family Medicine

## 2020-11-20 ENCOUNTER — Other Ambulatory Visit: Payer: Self-pay | Admitting: Family Medicine

## 2020-11-20 ENCOUNTER — Telehealth: Payer: Self-pay | Admitting: Plastic Surgery

## 2020-11-20 MED ORDER — SPACER/AERO-HOLDING CHAMBERS DEVI: 1.0000 | Freq: Every day | 0 refills | Status: DC | PRN

## 2020-11-20 MED ORDER — ALBUTEROL SULFATE HFA 108 (90 BASE) MCG/ACT IN AERS: 2.0000 | INHALATION_SPRAY | Freq: Four times a day (QID) | RESPIRATORY_TRACT | 2 refills | Status: DC | PRN

## 2020-11-20 NOTE — Telephone Encounter (Signed)
Patient called to say that she has excruciating pain in the left breast and wanted to have the provider call in something to help. The pain comes and goes. Dr. Marla Roe offered to do a televisit so patient was called to find out if this was okay. Patient agreed to the televisit tomorrow, Tuesday, 11/21/20.

## 2020-11-21 ENCOUNTER — Other Ambulatory Visit: Payer: Self-pay

## 2020-11-21 ENCOUNTER — Telehealth (INDEPENDENT_AMBULATORY_CARE_PROVIDER_SITE_OTHER): Payer: 59 | Admitting: Plastic Surgery

## 2020-11-21 ENCOUNTER — Encounter: Payer: Self-pay | Admitting: Plastic Surgery

## 2020-11-21 DIAGNOSIS — S21001D Unspecified open wound of right breast, subsequent encounter: Secondary | ICD-10-CM | POA: Diagnosis not present

## 2020-11-21 DIAGNOSIS — N62 Hypertrophy of breast: Secondary | ICD-10-CM

## 2020-11-21 MED ORDER — HYDROCODONE-ACETAMINOPHEN 5-325 MG PO TABS
1.0000 | ORAL_TABLET | Freq: Two times a day (BID) | ORAL | 0 refills | Status: AC | PRN
Start: 2020-11-21 — End: 2020-11-28

## 2020-11-21 MED ORDER — DOXYCYCLINE HYCLATE 100 MG PO TABS: 100.0000 mg | ORAL_TABLET | Freq: Two times a day (BID) | ORAL | 0 refills | Status: AC

## 2020-11-21 NOTE — Progress Notes (Signed)
The patient is a 60 year old female joining me by phone from her home.  She had a breast reduction in October.  She had some breakdown and wounds that we have been dealing with.  She tested positive for Covid and therefore could not come in today.  She is worried that the breast is draining a little bit.  This may be the case with her positive Covid.  I Georgina Peer go ahead and send in some antibiotics and some pain meds.  I had like to see her as soon as she is able to leave the house.  She is otherwise doing well and no sign of overt infection.  The patient gave consent to have this visit done by telemedicine / virtual visit.  This is also consent for access the chart and treat the patient via this visit. The patient is located at home.  I, the provider, am at the office.  We spent 10 minutes together for the visit.

## 2020-11-22 ENCOUNTER — Telehealth: Payer: Self-pay | Admitting: *Deleted

## 2020-11-22 NOTE — Telephone Encounter (Signed)
Spoke with the pt and scheduled a virtual visit on 11/24/2020.  Advised the pt to go to the ER if needed prior to the visit and she agreed.

## 2020-11-22 NOTE — Telephone Encounter (Signed)
-----   Message from Caren Macadam, MD sent at 11/20/2020  8:18 PM EST ----- She needs a visit. Virtual is probably what we will have to do with recent COVID. I have an opening on Friday at 8:30 as of now if that would work for her? Please see if we can get her on schedule. Thanks!

## 2020-11-23 ENCOUNTER — Encounter: Payer: Self-pay | Admitting: Plastic Surgery

## 2020-11-24 ENCOUNTER — Encounter: Payer: Self-pay | Admitting: Family Medicine

## 2020-11-24 ENCOUNTER — Telehealth: Payer: 59 | Admitting: Family Medicine

## 2020-11-24 VITALS — BP 128/81 | HR 82 | Temp 97.2°F

## 2020-11-24 DIAGNOSIS — F4321 Adjustment disorder with depressed mood: Secondary | ICD-10-CM

## 2020-11-24 DIAGNOSIS — R635 Abnormal weight gain: Secondary | ICD-10-CM

## 2020-11-24 MED ORDER — PHENTERMINE HCL 37.5 MG PO TABS: 37.5000 mg | ORAL_TABLET | Freq: Every day | ORAL | 0 refills | Status: DC

## 2020-11-24 NOTE — Progress Notes (Signed)
Virtual Visit via Video Note  I connected with Ariel [name]  on 11/24/20 at 11:30 AM EST by a video enabled telemedicine application and verified that I am speaking with the correct person using two identifiers.  Location patient: home Location provider: [place], [place] [address] Persons participating in the virtual visit: patient, provider  I discussed the limitations of evaluation and management by telemedicine and the availability of in person appointments. The patient expressed understanding and agreed to proceed.   Ariel [name] DOB: 11-14-1960 Encounter date: 11/24/2020  This is a 60 y.o. female who presents with Chief Complaint  Patient presents with  . Headache    Patient complains of recurrent headache  . Shortness of Breath  . Results    Positive Covid test on 11/14/2020    History of present illness:  *she has been following regularly with plastics for continued issues after breast reduction. Left breast has four areas of drainage; didn't start having issues with this until later. Right breast was the trouble initially - had more tissue taken out of right. She is dressing and taking care of left breast. Right breast has no nipple, areola. Didn't heal well either.   *has tried to stay positive. She does have good support system. She just doesn't feel normal. She is getting to point where she doesn't like to even look at self. Has to force self to bathe. She is so self conscious- size is different in breasts so she is uncomfortable with this. Has gained weight - she eats emotionally. She thinks she has gained 20lbs. States that mid section has neve been this big. Can't exercise because breast hurts. Can't do upper body. Can't do yoga because has to stretch and it hurts. She doesn't feel comfortable going out even to walk. Just logging in for work and staying in home.    Allergies  Allergen Reactions  . Wellbutrin [Bupropion]      Made her sick   Current Meds  Medication Sig  . albuterol (VENTOLIN HFA) 108 (90 Base) MCG/ACT inhaler Inhale 2 puffs into the lungs every 6 (six) hours as needed for wheezing or shortness of breath.  Marland Kitchen amLODipine (NORVASC) 5 MG tablet Take 1 tablet (5 mg total) by mouth daily.  Marland Kitchen atorvastatin (LIPITOR) 20 MG tablet TAKE 1 TABLET BY MOUTH EVERY DAY  . doxycycline (VIBRA-TABS) 100 MG tablet Take 1 tablet (100 mg total) by mouth 2 (two) times daily for 7 days.  . furosemide (LASIX) 20 MG tablet Take 1 tablet (20 mg total) by mouth daily as needed for edema.  . hydrochlorothiazide (HYDRODIURIL) 25 MG tablet TAKE 1 TABLET BY MOUTH EVERY DAY  . HYDROcodone-acetaminophen (NORCO) 5-325 MG tablet Take 1 tablet by mouth every 12 (twelve) hours as needed for up to 7 days for severe pain.  Marland Kitchen levothyroxine (SYNTHROID) 50 MCG tablet Take 1 tablet (50 mcg total) by mouth daily.  . Multiple Vitamin (MULTIVITAMIN) capsule Take 1 capsule by mouth daily. Centrum silver  . phentermine (ADIPEX-P) 37.5 MG tablet Take 1 tablet (37.5 mg total) by mouth daily before breakfast.  . potassium chloride (KLOR-CON) 10 MEQ tablet TAKE 1 TABLET (10 MEQ TOTAL) BY MOUTH 3 (THREE) TIMES A WEEK.  . sertraline (ZOLOFT) 100 MG tablet Take 1 tablet (100 mg total) by mouth daily.  Marland Kitchen Spacer/Aero-Holding Chambers DEVI 1 Device by Does not apply route daily as needed.  . SYMBICORT 80-4.5 MCG/ACT inhaler INHALE 2 PUFFS  INTO THE LUNGS IN THE MORNING AND AT BEDTIME.  . traZODone (DESYREL) 50 MG tablet TAKE 0.5-1 TABLETS (25-50 MG TOTAL) BY MOUTH AT BEDTIME AS NEEDED FOR SLEEP.  Marland Kitchen vitamin B-12 (CYANOCOBALAMIN) 500 MCG tablet Take 500 mcg by mouth daily.    Review of Systems  Constitutional: Negative for chills, fatigue and fever.  Respiratory: Negative for cough, chest tightness, shortness of breath (has improved since breast reduction) and wheezing.   Cardiovascular: Negative for chest pain, palpitations and leg swelling.  Skin:  Positive for wound.  Psychiatric/Behavioral: Negative for suicidal ideas. The patient is not nervous/anxious.     Objective:  BP 128/81   Pulse 82   Temp (!) 97.2 F (36.2 C)   LMP 06/29/2017       BP Readings from Last 3 Encounters:  11/24/20 128/81  11/01/20 134/86  10/16/20 128/87   Wt Readings from Last 3 Encounters:  09/26/20 164 lb (74.4 kg)  08/03/20 166 lb 7.2 oz (75.5 kg)  07/21/20 167 lb 11.2 oz (76.1 kg)    EXAM:  GENERAL: alert, oriented, appears well and in no acute distress  HEENT: atraumatic, conjunctiva clear, no obvious abnormalities on inspection of external nose and ears  NECK: normal movements of the head and neck  LUNGS: on inspection no signs of respiratory distress, breathing rate appears normal, no obvious gross SOB, gasping or wheezing  CV: no obvious cyanosis  MS: moves all visible extremities without noticeable abnormality  PSYCH/NEURO: pleasant and cooperative. Although trying to be positive, her self esteem is lower. Motivation is poor for doing anything outside of house/outside daily required activities.    Assessment/Plan  1. Weight gain She has been less active and states that she is eating more due to poor mood.  We discussed importance of regular exercise and how this can help with mood as well as weight control.  She is interested in something to help suppress appetite.  We are going to try phentermine.  I have encouraged her to keep a baseline weight at home and then we can follow this up in the office to recheck.  I discussed that maintaining body at a healthy weight and taking care of herself is going to continue to help with wound healing.  Discussed new medication(s) today with patient. Discussed potential side effects and patient verbalized understanding.   2. Adjustment disorder with depressed mood  We discussed mood and a little bit more detail today.  Rather than any adjustment with medication, she is agreeable to working on  getting back on track with regular walking, and working on self-care to help get her back on track.  She will continue with Zoloft 100 mg daily.  She will update me with any concerns or if she feels that mood is not improving.   Return in about 1 month (around 12/22/2020).   I discussed the assessment and treatment plan with the patient. The patient was provided an opportunity to ask questions and all were answered. The patient agreed with the plan and demonstrated an understanding of the instructions.   The patient was advised to call back or seek an in-person evaluation if the symptoms worsen or if the condition fails to improve as anticipated.  I provided 30 minutes of non-face-to-face time during this encounter.   Micheline Rough, MD

## 2020-12-01 ENCOUNTER — Ambulatory Visit: Payer: 59 | Admitting: Family Medicine

## 2020-12-04 ENCOUNTER — Telehealth: Payer: Self-pay | Admitting: *Deleted

## 2020-12-04 NOTE — Telephone Encounter (Signed)
Spoke with the pt and scheduled an appt for 3/18 to arrive at 11:15am.

## 2020-12-04 NOTE — Telephone Encounter (Signed)
-----   Message from Caren Macadam, MD sent at 11/24/2020 12:33 PM EST ----- Please call and push back her feb appointment to beginning of march (about a month). In person preferred.

## 2020-12-05 ENCOUNTER — Encounter: Payer: Self-pay | Admitting: Plastic Surgery

## 2020-12-05 ENCOUNTER — Ambulatory Visit (INDEPENDENT_AMBULATORY_CARE_PROVIDER_SITE_OTHER): Payer: 59 | Admitting: Plastic Surgery

## 2020-12-05 ENCOUNTER — Other Ambulatory Visit: Payer: Self-pay

## 2020-12-05 VITALS — BP 120/89 | HR 97

## 2020-12-05 DIAGNOSIS — S21001D Unspecified open wound of right breast, subsequent encounter: Secondary | ICD-10-CM

## 2020-12-05 DIAGNOSIS — Z9889 Other specified postprocedural states: Secondary | ICD-10-CM

## 2020-12-05 NOTE — Progress Notes (Signed)
   Subjective:    Patient ID: Ariel [name], female    DOB: [birth year], 60 y.o.   MRN: [id number]  The patient is a 60 year old female here for follow-up after undergoing bilateral breast reduction in October.  She has had some wound breakdown around the nipple areolar area.  She has done an amazing job keeping the area clean and dressed.  The right side is almost healed but has a 1 cm open area on the medial aspect of the nipple.  The periwound area has improved there does not appear to be any infection.  The left side had some fibrous tissue that I was able to excise with a pair of scissors and pickup after cleaning it.  It is a little bit painful.   Review of Systems  Constitutional: Negative for activity change and appetite change.  Eyes: Negative.   Respiratory: Negative.  Negative for chest tightness and shortness of breath.   Cardiovascular: Negative for leg swelling.  Gastrointestinal: Negative for abdominal distention and abdominal pain.  Genitourinary: Negative.   Musculoskeletal: Negative.        Objective:   Physical Exam Vitals and nursing note reviewed.  Constitutional:      Appearance: Normal appearance.  HENT:     Head: Normocephalic and atraumatic.  Cardiovascular:     Rate and Rhythm: Normal rate.     Pulses: Normal pulses.  Pulmonary:     Effort: Pulmonary effort is normal.  Neurological:     General: No focal deficit present.     Mental Status: She is alert and oriented to person, place, and time.  Psychiatric:        Mood and Affect: Mood normal.        Behavior: Behavior normal.         Assessment & Plan:     ICD-10-CM   1. Open wound of right breast, subsequent encounter  S21.001D   2. S/P bilateral breast reduction  Z98.890     Donated acell was placed on the right breast.  This should be covered with Adaptic [place] and gauze.  Donated Matri Derm was placed on the left breast.  This should be covered with the Adaptic and [place] as well.  I had like to  see the patient back in 2 weeks.  She knows to call with any questions or concerns.  She is going to send [place] forms to send another note to her work.  She needs medical time 2 times during the day to do her dressing changes.

## 2020-12-19 ENCOUNTER — Other Ambulatory Visit: Payer: Self-pay

## 2020-12-19 ENCOUNTER — Encounter: Payer: Self-pay | Admitting: Surgical

## 2020-12-19 ENCOUNTER — Ambulatory Visit (INDEPENDENT_AMBULATORY_CARE_PROVIDER_SITE_OTHER): Payer: 59 | Admitting: Surgical

## 2020-12-19 VITALS — BP 130/90 | HR 115

## 2020-12-19 DIAGNOSIS — S21002D Unspecified open wound of left breast, subsequent encounter: Secondary | ICD-10-CM

## 2020-12-19 DIAGNOSIS — S21001D Unspecified open wound of right breast, subsequent encounter: Secondary | ICD-10-CM

## 2020-12-19 DIAGNOSIS — Z9889 Other specified postprocedural states: Secondary | ICD-10-CM

## 2020-12-19 NOTE — Progress Notes (Addendum)
Patient is a 60 year old female here for follow-up after bilateral breast reduction on 08/03/2020.  Postoperatively she developed some wounds and we have been treating these with local wound care. She reports that she has been using Adaptic, K-Y jelly and/or Vaseline, 4 x 4 gauze and Mepilex border dressings for dressing changes daily or twice daily depending on drainage.  She reports that recently she has noticed some tissue protruding from the left breast wound that has concerned her.  She also reports that she has noticed increased drainage from her breasts with activities.  She reports ongoing tenderness and pain with palpation and some burning sensation.  She reports that she is still working from home, she feels as if this is going well, working from home has allowed her to feel more comfortable as she sometimes notices foul odors from the drainage from her wound and is self-conscious about this.  She also reports that she has noticed that she has to do dressing changes at least 2 times per day on most days and this allows for the time to do this in private without being embarrassed.  Patient reports that she has been seeing her therapist, reports that this has been somewhat helpful for her.  She reports that she is bothered by the size of her right breast, feels as if it is smaller than she would like.  Chaperone present on exam On exam right breast wound with some improvement, she is still having some drainage from this area.  It overall appears clean.  The wound is deep to the crease within her breast from loss of the NAC.  The wound overall is approximately 5 x 6 mm.  On exam the left breast wound with what appears to be fat necrosis protruding through the wound from the 12-3 o'clock mark.  There is no surrounding erythema.  No occult foul odors are noted.  No purulent drainage is noted.  The left breast wound superiorly is approximately 8 x 10 mm.  Laterally the wound is approximately 18 x 5  mm.   Donated ACell was placed over the left breast and right breast wound.  This was covered with Adaptic and K-Y jelly.  4 x 4 gauze and Mepilex border dressings were then placed over this.  I recommend she leave the Adaptic in place for the next 2 days, cover it daily with K-Y jelly and new gauze and top dressing.  I recommend she change the Adaptic after 2 to 3 days and then change it daily with K-Y jelly and or Vaseline followed by 4 x 4 gauze and Mepilex border dressing.  Patient is understanding and in agreement with this.  I discussed with the patient that we will write her out of work due to the ongoing wound care, odors that she is noting.  I discussed with the patient that I will also complete some forms for her to allow her to breaks per day for dressing changes as she has noticed that during the middle of the workday she is having to change her dressings due to the increased drainage.  I recommend she call us with any questions or concerns, recommend she follow-up in 2 weeks for reevaluation.  Pictures were obtained of the patient and placed in the chart with the patient's or guardian's permission.

## 2020-12-24 ENCOUNTER — Other Ambulatory Visit: Payer: Self-pay | Admitting: Family Medicine

## 2020-12-24 DIAGNOSIS — F419 Anxiety disorder, unspecified: Secondary | ICD-10-CM

## 2020-12-25 ENCOUNTER — Other Ambulatory Visit: Payer: Self-pay | Admitting: Family Medicine

## 2020-12-25 DIAGNOSIS — F419 Anxiety disorder, unspecified: Secondary | ICD-10-CM

## 2021-01-02 ENCOUNTER — Encounter: Payer: Self-pay | Admitting: Surgical

## 2021-01-02 ENCOUNTER — Ambulatory Visit (INDEPENDENT_AMBULATORY_CARE_PROVIDER_SITE_OTHER): Payer: 59 | Admitting: Surgical

## 2021-01-02 ENCOUNTER — Other Ambulatory Visit: Payer: Self-pay

## 2021-01-02 VITALS — BP 120/78 | HR 91

## 2021-01-02 DIAGNOSIS — Z9889 Other specified postprocedural states: Secondary | ICD-10-CM

## 2021-01-02 DIAGNOSIS — S21002D Unspecified open wound of left breast, subsequent encounter: Secondary | ICD-10-CM

## 2021-01-02 DIAGNOSIS — S21001D Unspecified open wound of right breast, subsequent encounter: Secondary | ICD-10-CM

## 2021-01-02 NOTE — Progress Notes (Signed)
Patient is a 60 year old female here for follow-up after bilateral breast reduction on 08/03/2020.  She has bilateral breast wounds which we have been treating with local wound care.  She was last seen in the office on 12/19/2020.  Patient reports today that she is not doing well, she reports that dealing with the wound care for the past few months after her surgery has been causing her a lot of mental distress.  She reports that she has been seeing her therapist and reports that this has been minimally helpful.  She reports that she has had some assistance at work with breaks to allow her to complete dressing changes and reports this has been very helpful.   She also reports that she is frustrated with the size of her breasts, reports that she would have liked to been bigger.  She is also bothered by the asymmetry.  Chaperone present on exam On exam left breast wound is improving, she is still having what appears to be fat necrosis protruding through the wound bed.  I was able to excise some of this using pickups and scissors, patient tolerated this well.  She has some tenderness with palpation of the area.  There is no surrounding erythema or cellulitic changes noted.  I do not notice any overt foul odors on exam today.  She is having active drainage that appears to be liquefied fat necrosis.  The right breast wound is overall doing well, there is some granulation tissue present with some new epithelialization noted at the edges, however it is still open.  There is no active drainage noted.  Dr. Marla Roe was consulted during today's appointment for further evaluation.  She discussed the plan with the patient and spent some time discussing options with the patient.  We discussed continuing with local wound care for a few more weeks and if we do not notice much improvement possibly planning for excision of the left breast and right breast wounds with possible primary closure.  She discussed with the patient  that if we were to undergo excision too soon she would be at an increased risk of loss of the nipple areola.  Patient is understanding of this.  Pictures were obtained of the patient and placed in the chart with the patient's or guardian's permission.  Donated ACell was placed on the left breast wound, this was covered with Adaptic and K-Y jelly.  Adaptic and K-Y jelly was placed on the right breast wound.  Recommend changing this as needed due to drainage, recommend being careful with dressing changes to avoid the ACell powder being removed.  All the patient's questions were answered to her content.  I recommend she call with any questions or concerns or if she would like to be seen sooner.  We will schedule a follow-up with Dr. Marla Roe in 2 weeks to reevaluate and discuss further surgical intervention options.

## 2021-01-05 ENCOUNTER — Ambulatory Visit: Payer: 59 | Admitting: Family Medicine

## 2021-01-05 ENCOUNTER — Other Ambulatory Visit: Payer: Self-pay

## 2021-01-05 ENCOUNTER — Encounter: Payer: Self-pay | Admitting: Family Medicine

## 2021-01-05 ENCOUNTER — Other Ambulatory Visit: Payer: Self-pay | Admitting: Family Medicine

## 2021-01-05 VITALS — BP 112/80 | HR 78 | Temp 98.2°F | Ht 63.0 in | Wt 156.6 lb

## 2021-01-05 DIAGNOSIS — R739 Hyperglycemia, unspecified: Secondary | ICD-10-CM

## 2021-01-05 DIAGNOSIS — I10 Essential (primary) hypertension: Secondary | ICD-10-CM

## 2021-01-05 DIAGNOSIS — R5383 Other fatigue: Secondary | ICD-10-CM

## 2021-01-05 DIAGNOSIS — F4321 Adjustment disorder with depressed mood: Secondary | ICD-10-CM | POA: Diagnosis not present

## 2021-01-05 DIAGNOSIS — E785 Hyperlipidemia, unspecified: Secondary | ICD-10-CM | POA: Diagnosis not present

## 2021-01-05 DIAGNOSIS — N3000 Acute cystitis without hematuria: Secondary | ICD-10-CM

## 2021-01-05 DIAGNOSIS — G4700 Insomnia, unspecified: Secondary | ICD-10-CM

## 2021-01-05 LAB — COMPREHENSIVE METABOLIC PANEL
ALT: 18 U/L (ref 0–35)
AST: 23 U/L (ref 0–37)
Albumin: 3.7 g/dL (ref 3.5–5.2)
Alkaline Phosphatase: 105 U/L (ref 39–117)
BUN: 15 mg/dL (ref 6–23)
CO2: 29 mEq/L (ref 19–32)
Calcium: 9.7 mg/dL (ref 8.4–10.5)
Chloride: 104 mEq/L (ref 96–112)
Creatinine, Ser: 0.76 mg/dL (ref 0.40–1.20)
GFR: 86.64 mL/min (ref >60.00)
Glucose, Bld: 88 mg/dL (ref 70–99)
Potassium: 4.3 mEq/L (ref 3.5–5.1)
Sodium: 141 mEq/L (ref 135–145)
Total Bilirubin: 0.6 mg/dL (ref 0.2–1.2)
Total Protein: 6.7 g/dL (ref 6.0–8.3)

## 2021-01-05 LAB — CBC WITH DIFFERENTIAL/PLATELET
Basophils Absolute: 0 10*3/uL (ref 0.0–0.1)
Basophils Relative: 0.5 % (ref 0.0–3.0)
Eosinophils Absolute: 0.1 10*3/uL (ref 0.0–0.7)
Eosinophils Relative: 1.6 % (ref 0.0–5.0)
HCT: 34 % — ABNORMAL LOW (ref 36.0–46.0)
Hemoglobin: 11.5 g/dL — ABNORMAL LOW (ref 12.0–15.0)
Lymphocytes Relative: 29.9 % (ref 12.0–46.0)
Lymphs Abs: 1.6 10*3/uL (ref 0.7–4.0)
MCHC: 34 g/dL (ref 30.0–36.0)
MCV: 87.6 fl (ref 78.0–100.0)
Monocytes Absolute: 0.5 10*3/uL (ref 0.1–1.0)
Monocytes Relative: 8.4 % (ref 3.0–12.0)
Neutro Abs: 3.2 10*3/uL (ref 1.4–7.7)
Neutrophils Relative %: 59.6 % (ref 43.0–77.0)
Platelets: 268 10*3/uL (ref 150.0–400.0)
RBC: 3.88 Mil/uL (ref 3.87–5.11)
RDW: 15 % (ref 11.5–15.5)
WBC: 5.4 10*3/uL (ref 4.0–10.5)

## 2021-01-05 LAB — LIPID PANEL
Cholesterol: 169 mg/dL (ref 0–200)
HDL: 40.3 mg/dL (ref >39.00)
LDL Cholesterol: 113 mg/dL — ABNORMAL HIGH (ref 0–99)
NonHDL: 128.95
Total CHOL/HDL Ratio: 4
Triglycerides: 78 mg/dL (ref 0.0–149.0)
VLDL: 15.6 mg/dL (ref 0.0–40.0)

## 2021-01-05 LAB — TSH: TSH: 2.28 u[IU]/mL (ref 0.35–4.50)

## 2021-01-05 LAB — T4, FREE: Free T4: 0.94 ng/dL (ref 0.60–1.60)

## 2021-01-05 LAB — VITAMIN B12: Vitamin B-12: 801 pg/mL (ref 211–911)

## 2021-01-05 LAB — HEMOGLOBIN A1C: Hgb A1c MFr Bld: 5.9 % (ref 4.6–6.5)

## 2021-01-05 LAB — VITAMIN D 25 HYDROXY (VIT D DEFICIENCY, FRACTURES): VITD: 23.83 ng/mL — ABNORMAL LOW (ref 30.00–100.00)

## 2021-01-05 MED ORDER — NITROFURANTOIN MONOHYD MACRO 100 MG PO CAPS: 100.0000 mg | ORAL_CAPSULE | Freq: Two times a day (BID) | ORAL | 0 refills | Status: DC

## 2021-01-05 NOTE — Progress Notes (Signed)
Ariel [name] DOB: 11/24/1960 Encounter date: 01/05/2021  This is a 60 y.o. female who presents with Chief Complaint  Patient presents with  . Follow-up    History of present illness: Last visit was 11/24/20 - at last visit we discussed her adjustment and depressed mood worsened by recent issues with breast healing s/p reduction. She elected to continue with zoloft 100mg  but was going to work on regular activity and self care to try and help improve mood. We agreed to shorter term follow up to make sure mood was moving in the right direction.   She states she had two melt downs at breast visits this year.   She is still doing well at work; able to accomplish what she needs to, but it is harder for her. Not sure if it is due to her being isolated/at home. Doesn't struggle as much with work as she does with other activities. 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 When there is break; then her mind drifts. Side tracked easily; not finishing tasks. Short term memory - like trying to remember things she thinks of to do is trouble.   A lot of day revolves around her wound care - having to change multiple times/day and has a lot of liquid drainage from left breast.   States has UTI - has been doing AZO.    Allergies  Allergen Reactions  . Wellbutrin [Bupropion]     Made her sick   Current Meds  Medication Sig  . albuterol (VENTOLIN HFA) 108 (90 Base) MCG/ACT inhaler Inhale 2 puffs into the lungs every 6 (six) hours as needed for wheezing or shortness of breath.  Marland Kitchen amLODipine (NORVASC) 5 MG tablet Take 1 tablet (5 mg total) by mouth daily.  Marland Kitchen atorvastatin (LIPITOR) 20 MG tablet TAKE 1 TABLET BY MOUTH EVERY DAY  . hydrochlorothiazide (HYDRODIURIL) 25 MG tablet TAKE 1 TABLET BY MOUTH EVERY DAY  . levothyroxine (SYNTHROID) 50 MCG tablet Take 1 tablet (50 mcg total) by mouth daily.  . Multiple Vitamin (MULTIVITAMIN) capsule Take 1 capsule by mouth daily. Centrum silver  . nitrofurantoin,  macrocrystal-monohydrate, (MACROBID) 100 MG capsule Take 1 capsule (100 mg total) by mouth 2 (two) times daily.  . potassium chloride (KLOR-CON) 10 MEQ tablet TAKE 1 TABLET (10 MEQ TOTAL) BY MOUTH 3 (THREE) TIMES A WEEK.  . sertraline (ZOLOFT) 100 MG tablet TAKE 1 TABLET BY MOUTH EVERY DAY  . Spacer/Aero-Holding Chambers DEVI 1 Device by Does not apply route daily as needed.  . SYMBICORT 80-4.5 MCG/ACT inhaler INHALE 2 PUFFS INTO THE LUNGS IN THE MORNING AND AT BEDTIME.  . [DISCONTINUED] furosemide (LASIX) 20 MG tablet Take 1 tablet (20 mg total) by mouth daily as needed for edema.  . [DISCONTINUED] traZODone (DESYREL) 50 MG tablet TAKE 0.5-1 TABLETS (25-50 MG TOTAL) BY MOUTH AT BEDTIME AS NEEDED FOR SLEEP.  . [DISCONTINUED] vitamin B-12 (CYANOCOBALAMIN) 500 MCG tablet Take 500 mcg by mouth daily.    Review of Systems  Constitutional: Negative for chills, fatigue and fever.  Respiratory: Negative for cough, chest tightness, shortness of breath and wheezing.   Cardiovascular: Negative for chest pain, palpitations and leg swelling.  Skin: Positive for wound.  Psychiatric/Behavioral:       Still struggling with mood; very self conscious; not getting out of house. Feels that breast abnormalities are very notable to others/worries about drainage out in public.    Objective:  BP 112/80 (BP Location: Left Arm, Patient Position: Sitting, Cuff Size: Normal)  Pulse 78   Temp 98.2 F (36.8 C) (Oral)   Ht 5\' 3"  (1.6 m)   Wt 156 lb 9.6 oz (71 kg)   LMP 06/29/2017   SpO2 98%   BMI 27.74 kg/m   Weight: 156 lb 9.6 oz (71 kg)   BP Readings from Last 3 Encounters:  01/05/21 112/80  01/02/21 120/78  12/19/20 130/90   Wt Readings from Last 3 Encounters:  01/05/21 156 lb 9.6 oz (71 kg)  09/26/20 164 lb (74.4 kg)  08/03/20 166 lb 7.2 oz (75.5 kg)    Physical Exam Constitutional:      General: She is not in acute distress.    Appearance: She is well-developed.  Cardiovascular:     Rate and  Rhythm: Normal rate and regular rhythm.     Heart sounds: Normal heart sounds. No murmur heard. No friction rub.  Pulmonary:     Effort: Pulmonary effort is normal. No respiratory distress.     Breath sounds: Normal breath sounds. No wheezing or rales.  Musculoskeletal:     Right lower leg: No edema.     Left lower leg: No edema.  Neurological:     Mental Status: She is alert and oriented to person, place, and time.  Psychiatric:        Attention and Perception: Attention normal.        Mood and Affect: Mood is depressed.        Speech: Speech normal.        Behavior: Behavior normal.        Cognition and Memory: Cognition normal.     Assessment/Plan  1. Adjustment disorder with depressed mood Continue with zoloft 100mg ; she does not prefer to add anything additional at this time. She is going to work on getting out of house; she has been more active in house which has helped somewhat with mood.   2. Hyperlipidemia, unspecified hyperlipidemia type Cont with lilpitor 20mg  daiy - Lipid panel; Future - Lipid panel  3. Hyperglycemia Work on low carb foods, regular activity/exercise - Hemoglobin A1c; Future - Hemoglobin A1c  4. Primary hypertension Well controlled with hctz 25mg  daily, amlodipine 5mg  daily - CBC with Differential/Platelet; Future - Comprehensive metabolic panel; Future - Comprehensive metabolic panel - CBC with Differential/Platelet  5. Fatigue, unspecified type Suspect somewhat related to mood; decrease in activity, but we will also check bloodwork today. - TSH; Future - Vitamin B12; Future - VITAMIN D 25 Hydroxy (Vit-D Deficiency, Fractures); Future - T4, free; Future - T4, free - VITAMIN D 25 Hydroxy (Vit-D Deficiency, Fractures) - Vitamin B12 - TSH  6. Acute cystitis without hematuria - Urinalysis with Culture Reflex; Future - nitrofurantoin, macrocrystal-monohydrate, (MACROBID) 100 MG capsule; Take 1 capsule (100 mg total) by mouth 2 (two) times  daily.  Dispense: 10 capsule; Refill: 0 - Urinalysis with Culture Reflex  Return in about 3 months (around 04/07/2021) for Chronic condition visit. I did have colloid dressing in office; we spent time discussing different types of dressings that may make getting out of house easier. Cost of dressings has been concern since new year and reset of deductible. Hopeful that colloid dressing will help with exudate/absorbtion but also not irritating skin as much.      Micheline Rough, MD

## 2021-01-07 ENCOUNTER — Other Ambulatory Visit: Payer: Self-pay | Admitting: Family Medicine

## 2021-01-07 ENCOUNTER — Encounter: Payer: Self-pay | Admitting: Plastic Surgery

## 2021-01-08 LAB — URINALYSIS W MICROSCOPIC + REFLEX CULTURE
Bilirubin Urine: NEGATIVE
Glucose, UA: NEGATIVE
Hgb urine dipstick: NEGATIVE
Hyaline Cast: NONE SEEN /LPF
Ketones, ur: NEGATIVE
Nitrites, Initial: NEGATIVE
Specific Gravity, Urine: 1.023 (ref 1.001–1.03)
WBC, UA: >=60 /HPF — AB (ref 0–5)
pH: 5.5 (ref 5.0–8.0)

## 2021-01-08 LAB — CULTURE INDICATED

## 2021-01-08 LAB — URINE CULTURE
MICRO NUMBER:: 11667645
SPECIMEN QUALITY:: ADEQUATE

## 2021-01-10 ENCOUNTER — Other Ambulatory Visit: Payer: Self-pay | Admitting: Endocrinology

## 2021-01-16 IMAGING — US US THYROID
1 series · 13 of 25 positions shown · non-contrast
Comparison: Prior thyroid ultrasound 04/15/2019

CLINICAL DATA: Other. 59-year-old female with a history of prior
left thyroid lobectomy in Wednesday May, 2019. Pathology specimen
contained a 4 mm focus of papillary thyroid carcinoma.

EXAM:
THYROID ULTRASOUND
TECHNIQUE: Ultrasound examination of the thyroid gland and adjacent soft
tissues was performed.

[Series 1: us thyroid · 0.05mm/px · 13 of 198 slices shown]
[im 1/198]
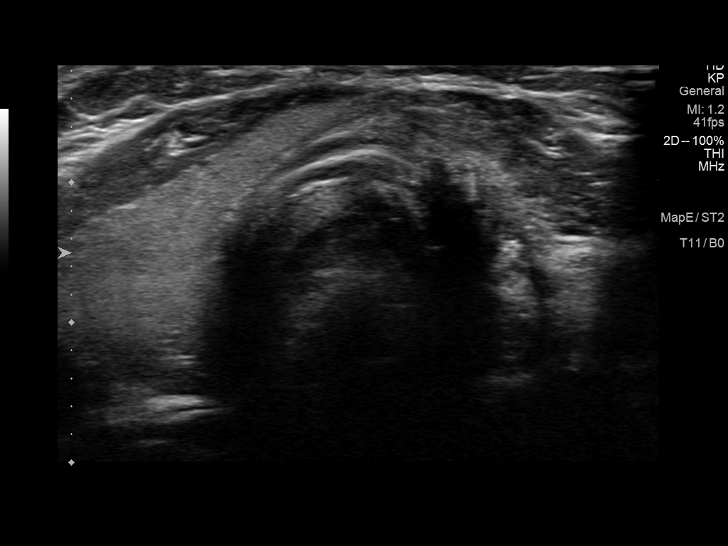
[im 17/198]
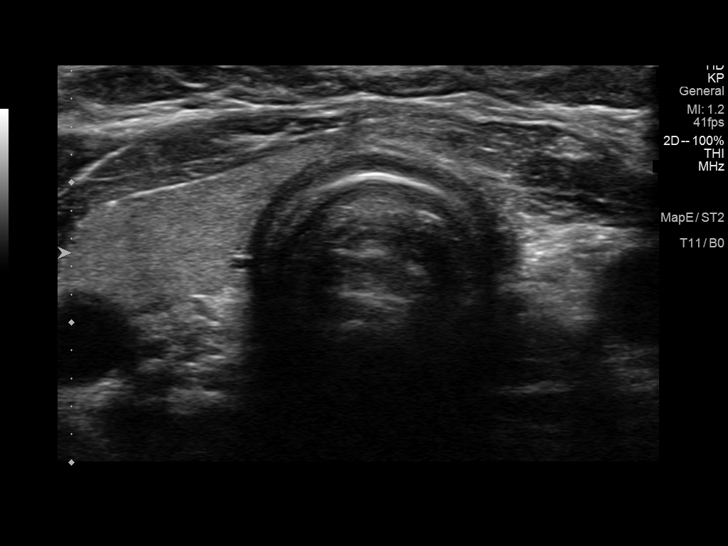
[im 33/198]
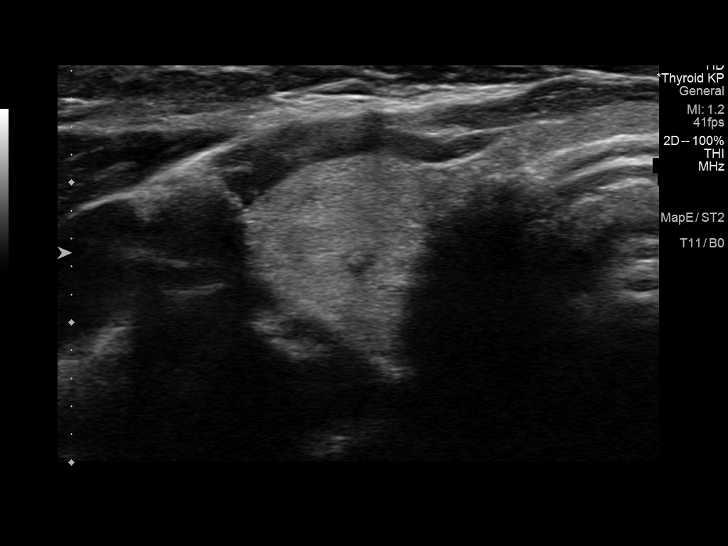
[im 50/198]
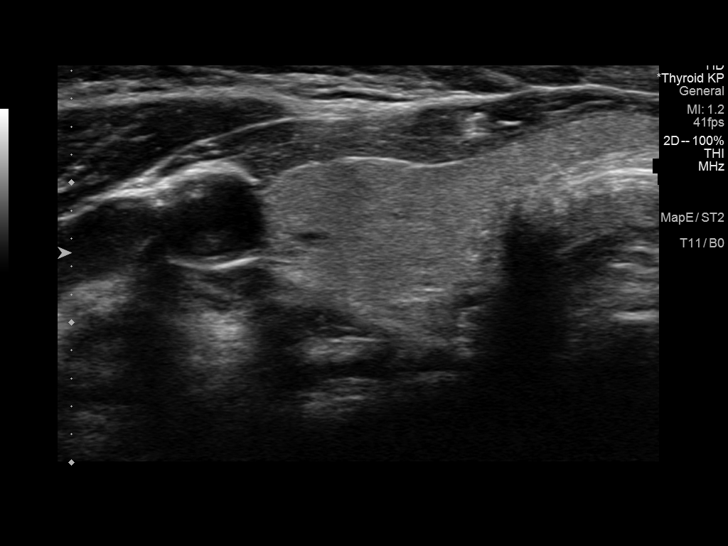
[im 66/198]
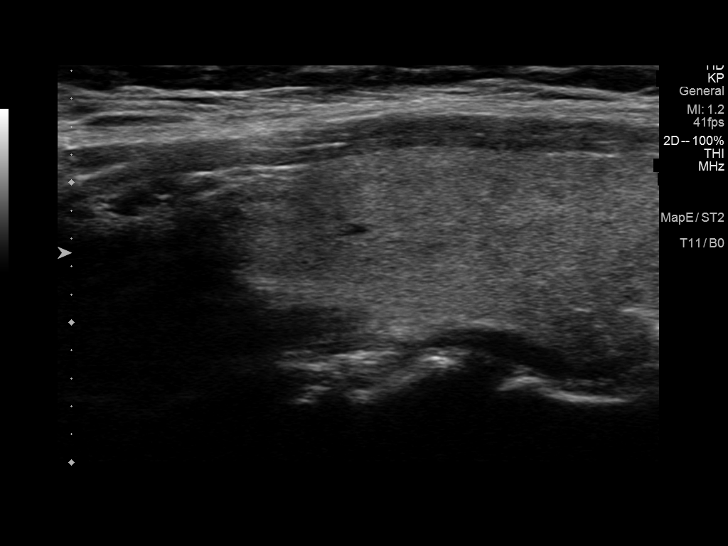
[im 83/198]
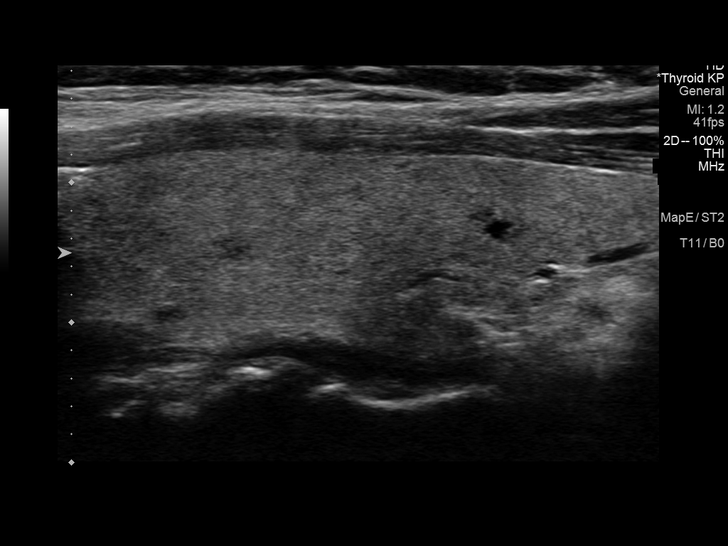
[im 99/198]
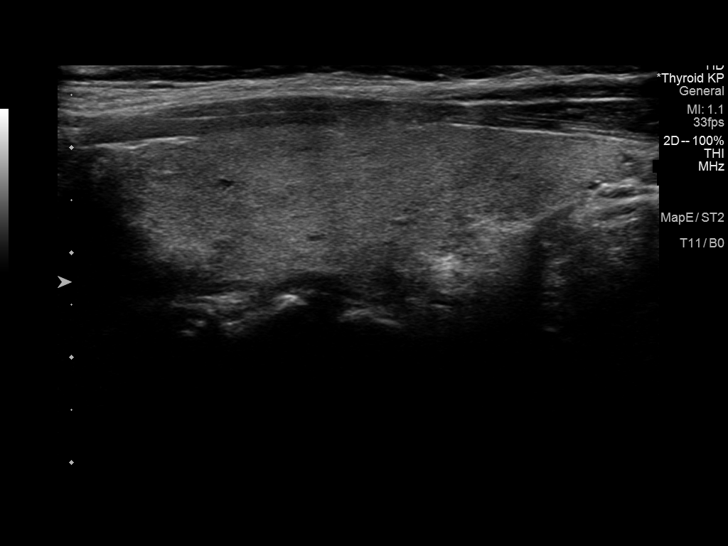
[im 115/198]
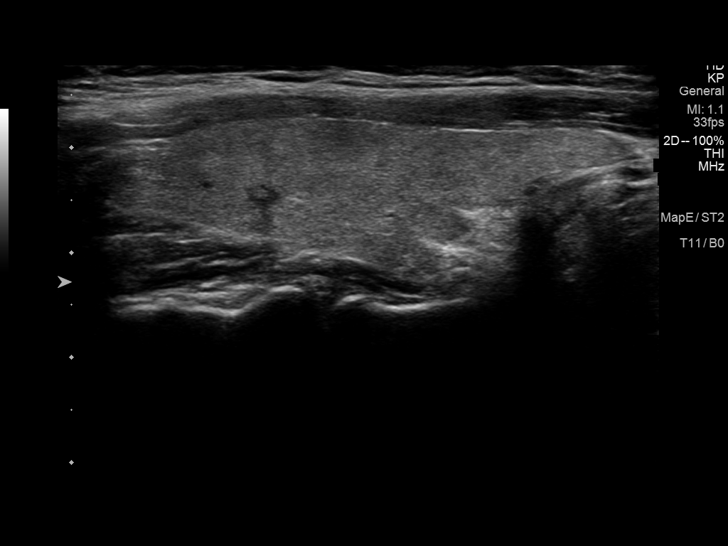
[im 132/198]
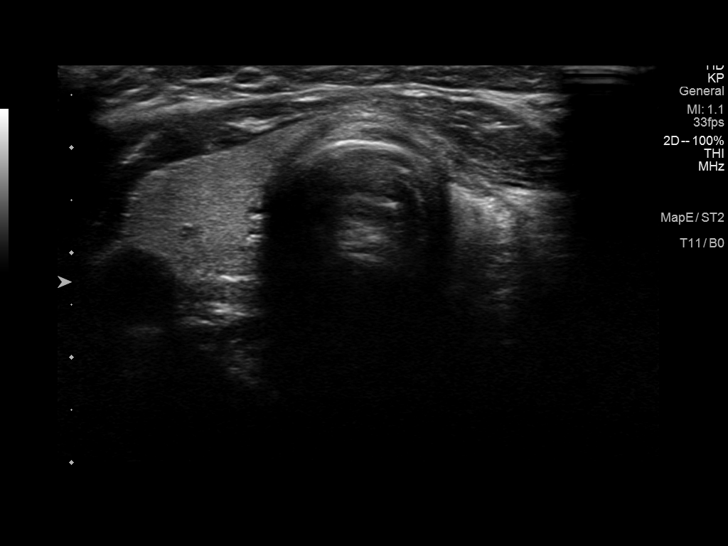
[im 148/198]
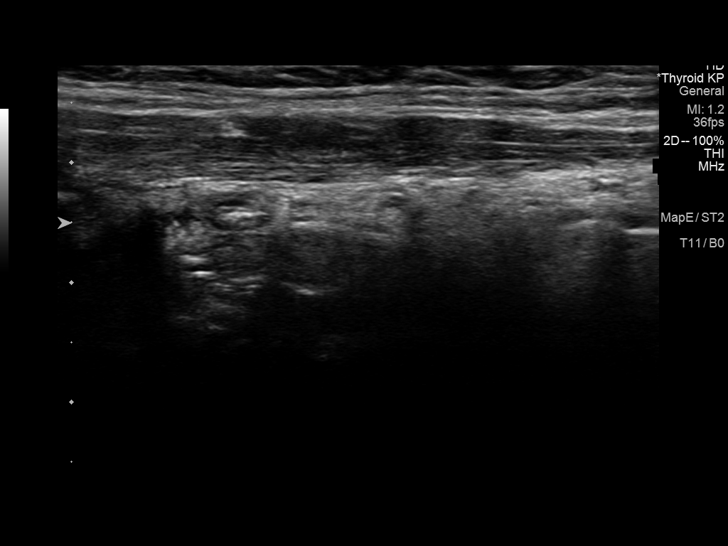
[im 165/198]
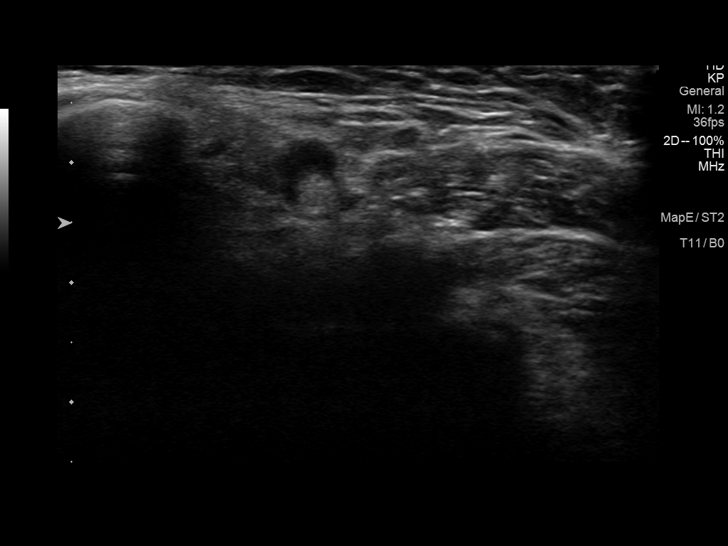
[im 181/198]
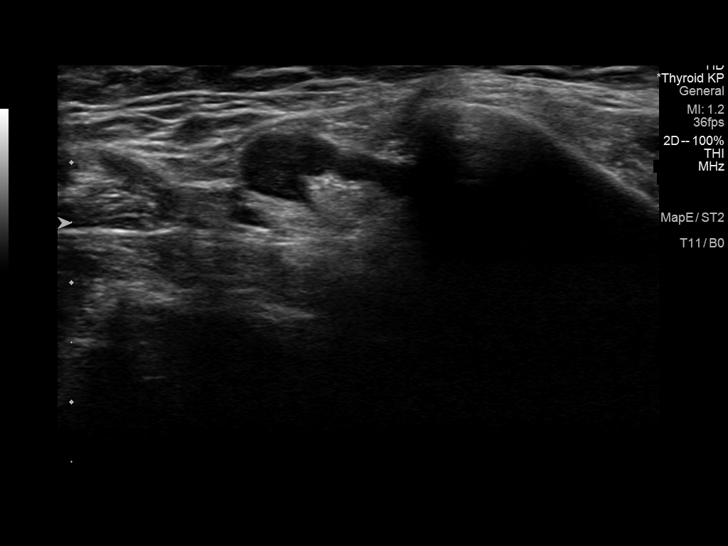
[im 198/198]
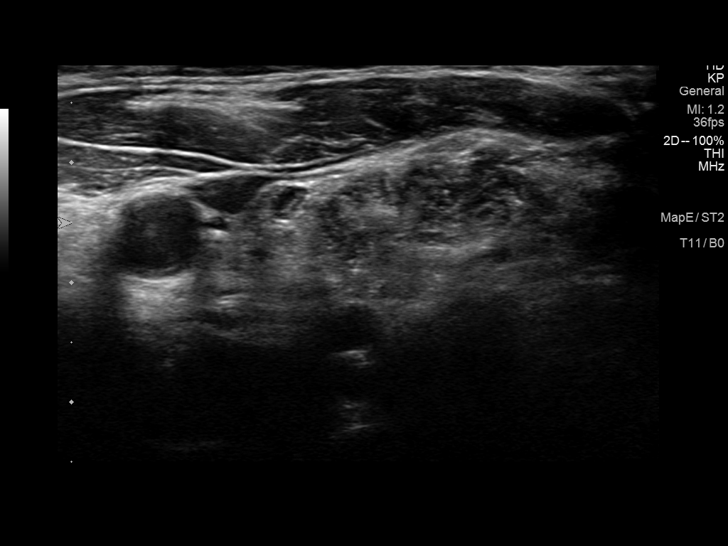

[13 of 25 positions shown; findings below may reference images not displayed]

FINDINGS: Parenchymal Echotexture: Mildly heterogenous

Isthmus: 0.2 cm

Right lobe: 5.2 x 1.5 x 1.8 cm

Left lobe: Surgically absent

_________________________________________________________

Estimated total number of nodules >/= 1 cm: 0

Number of spongiform nodules >/=  2 cm not described below (TR1): 0

Number of mixed cystic and solid nodules >/= 1.5 cm not described
below (TR2): 0

_________________________________________________________

No discrete nodules are seen within the residual right-sided thyroid
gland. No evidence of recurrent thyroid tissue or abnormal
nodularity in the left thyroid resection bed. No suspicious
lymphadenopathy.
IMPRESSION: 1. Surgical changes of prior left hemithyroidectomy without evidence
of residual or recurrent thyroid tissue, nodularity or
lymphadenopathy.
2. No evidence of nodule in the remaining right thyroid gland.

The above is in keeping with the ACR TI-RADS recommendations - [HOSPITAL] 3045;[DATE].

## 2021-01-19 ENCOUNTER — Ambulatory Visit (INDEPENDENT_AMBULATORY_CARE_PROVIDER_SITE_OTHER): Payer: 59 | Admitting: Plastic Surgery

## 2021-01-19 ENCOUNTER — Other Ambulatory Visit: Payer: Self-pay

## 2021-01-19 ENCOUNTER — Encounter: Payer: Self-pay | Admitting: Plastic Surgery

## 2021-01-19 VITALS — BP 137/85 | HR 97

## 2021-01-19 DIAGNOSIS — S21001D Unspecified open wound of right breast, subsequent encounter: Secondary | ICD-10-CM

## 2021-01-19 NOTE — Progress Notes (Signed)
   Subjective:    Patient ID: Ariel [name], female    DOB: September 29, 1961, 59 y.o.   MRN: [id number]  The patient is a 60 year old female here for follow-up on her bilateral breast reduction in her nipple areola wounds.  She is doing really really well.  I am very happy for her.  The areas are healing nicely.  She does have a lot of underlying firmness and possible necrosis.  We will likely have to deal with that in the future.  There is no sign of ongoing infection.     Review of Systems  Constitutional: Negative.   HENT: Negative.   Eyes: Negative.   Respiratory: Negative.   Cardiovascular: Negative.   Genitourinary: Negative.   Hematological: Negative.        Objective:   Physical Exam Vitals and nursing note reviewed.  Constitutional:      Appearance: Normal appearance.  HENT:     Head: Normocephalic and atraumatic.  Cardiovascular:     Rate and Rhythm: Normal rate.     Pulses: Normal pulses.  Neurological:     Mental Status: She is alert. Mental status is at baseline.  Psychiatric:        Mood and Affect: Mood normal.        Behavior: Behavior normal.        Assessment & Plan:     ICD-10-CM   1. Open wound of right breast, subsequent encounter  S21.001D     I would like to see her back in 1 month for further evaluation and possible discussion of plans for the future.  The patient is in agreement.

## 2021-01-22 ENCOUNTER — Other Ambulatory Visit: Payer: Self-pay | Admitting: Family Medicine

## 2021-02-20 ENCOUNTER — Encounter: Payer: Self-pay | Admitting: Plastic Surgery

## 2021-02-20 ENCOUNTER — Other Ambulatory Visit: Payer: Self-pay

## 2021-02-20 ENCOUNTER — Ambulatory Visit (INDEPENDENT_AMBULATORY_CARE_PROVIDER_SITE_OTHER): Payer: 59 | Admitting: Plastic Surgery

## 2021-02-20 VITALS — BP 139/86 | HR 93

## 2021-02-20 DIAGNOSIS — S21001D Unspecified open wound of right breast, subsequent encounter: Secondary | ICD-10-CM

## 2021-02-20 MED ORDER — FLUOCINONIDE EMULSIFIED BASE 0.05 % EX CREA: 1.0000 "application " | TOPICAL_CREAM | Freq: Two times a day (BID) | CUTANEOUS | 0 refills | Status: AC

## 2021-02-20 NOTE — Progress Notes (Signed)
The patient is a 60 year old female here for follow-up on her breast reduction.  She had wound breakdown around the areola.  She is doing really well and the area is coming back.  There is about a 2 mm opening but it does not seem to track anywhere.  She has a little bit of skin irritation around the left areola.  She is very happy with her progress.  I am going to send a steroid cream and to help calm the skin around the left breast.  I would like to see her back in a month.  The patient is pleased with this plan.

## 2021-02-21 ENCOUNTER — Telehealth: Payer: Self-pay | Admitting: *Deleted

## 2021-02-21 NOTE — Telephone Encounter (Signed)
Received Response Requested:-Alternative Requested from [facility] for Fluocinonide-E 0.05% Cream.  Pharmacy comments:Alternative requested:Smallest Tube is 15GM.  Called pharmacy and spoke with Verdis Frederickson and informed her per Dr. Delmer Islam do the smaller tube 15GM.//AB/CMA

## 2021-03-09 ENCOUNTER — Other Ambulatory Visit: Payer: Self-pay | Admitting: Endocrinology

## 2021-03-20 ENCOUNTER — Ambulatory Visit: Payer: 59 | Admitting: Plastic Surgery

## 2021-04-04 ENCOUNTER — Telehealth: Payer: Self-pay | Admitting: Plastic Surgery

## 2021-04-04 NOTE — Telephone Encounter (Signed)
Ariel [name] called to ask if she is now able to donate blood after her reduction. Please call to advise. [phone]. Ariel [name] requested to be left a vm on her personal line if she is unavailable. Thanks.

## 2021-04-05 NOTE — Telephone Encounter (Signed)
Returned patients call. Inquired if her areolas have healed. Verified they have not completely healed. Suggested to hold off on donating blood and wait for her appointment with Dr. Marla Roe on 05/01/21 to observe her progress and if she is able to donate blood. Patient understood and agreed with plan.

## 2021-04-09 ENCOUNTER — Ambulatory Visit: Payer: 59 | Admitting: Family Medicine

## 2021-04-09 ENCOUNTER — Other Ambulatory Visit: Payer: Self-pay

## 2021-04-09 ENCOUNTER — Encounter: Payer: Self-pay | Admitting: Family Medicine

## 2021-04-09 VITALS — BP 128/82 | HR 85 | Temp 97.7°F | Ht 63.0 in | Wt 156.0 lb

## 2021-04-09 DIAGNOSIS — K5900 Constipation, unspecified: Secondary | ICD-10-CM

## 2021-04-09 DIAGNOSIS — E785 Hyperlipidemia, unspecified: Secondary | ICD-10-CM | POA: Diagnosis not present

## 2021-04-09 DIAGNOSIS — I10 Essential (primary) hypertension: Secondary | ICD-10-CM

## 2021-04-09 DIAGNOSIS — R739 Hyperglycemia, unspecified: Secondary | ICD-10-CM

## 2021-04-09 DIAGNOSIS — F4321 Adjustment disorder with depressed mood: Secondary | ICD-10-CM

## 2021-04-09 DIAGNOSIS — R600 Localized edema: Secondary | ICD-10-CM

## 2021-04-09 MED ORDER — LOSARTAN POTASSIUM 25 MG PO TABS: 25.0000 mg | ORAL_TABLET | Freq: Every day | ORAL | 1 refills | Status: DC

## 2021-04-09 MED ORDER — LEVOTHYROXINE SODIUM 50 MCG PO TABS: 50.0000 ug | ORAL_TABLET | Freq: Every day | ORAL | 1 refills | Status: DC

## 2021-04-09 NOTE — Patient Instructions (Addendum)
Try the miralax - 1 scoop daily. Let me know if you are not getting good results in 2-4 weeks.   *stop amlodipine *restart hydrochlorothiazide *start losartan 25mg  daily

## 2021-04-09 NOTE — Progress Notes (Signed)
Ariel [name] DOB: 30-May-1961 Encounter date: 04/09/2021  This is a 60 y.o. female who presents with Chief Complaint  Patient presents with   Follow-up    History of present illness: Following regularly with Dr. Marla Roe s/p breast reduction with wound breakdown around areola bilat. Last visit was over a month ago.   Last visit with me was 01/05/21: at that time she was having a hard time with mental and physical burden of breast infections. She was continued on zoloft 100mg  daily and was going to work on getting out of house; restarting exercise routine.   She went back to work June 1st. She is back in the office. She feels like she healed up better once she started using dressings we tried at last visit. She went clothes shopping, so feeling better about things she is wearing and how she looks. Not wanting to go out.   Having issue passing bowels. She wanted to get started on her new health journey. Never been regular. She can go a whole week and not have BM - that is with good water intake, fruits, veggies, and walking. Has tried stool softeners, magnesium, ex-lax. Feels bloated, full, but stomach will growl. Last BM was Friday. Pebbles when she goes. Usually not a good one. Really has had trouble since she was a kid. No blood in stools.   HTN: Amlodipine 5 mg, hydrochlorothiazide 25 mg. She is checking at home. Didn't take her medications yet this morning. She is setting goals with steps now.  HL: Lipitor 20 mg daily  Hyperglycemia: Diet and exercise controlled.  Last A1c from 01/05/2021 was 5.9  Notes that on watch she is not getting deep sleep. Only really feels like she gets good sleep if she takes sleeping pills. Used to be able to get up and go to bathroom and get right back to sleep. Some mornings waking up more tired. Maybe mind going a little too much.   Swelling a lot in her ankles. Even taking 2 of lasix at the same time she doesn't get improvement. Just really started to  get worse; worse in heat. Does have desk where she can stand at work. Hands feel more swollen too. Doesn't urinate much after taking lasix.    Allergies  Allergen Reactions   Wellbutrin [Bupropion]     Made her sick   Current Meds  Medication Sig   albuterol (VENTOLIN HFA) 108 (90 Base) MCG/ACT inhaler Inhale 2 puffs into the lungs every 6 (six) hours as needed for wheezing or shortness of breath.   amLODipine (NORVASC) 5 MG tablet TAKE 1 TABLET BY MOUTH EVERY DAY   atorvastatin (LIPITOR) 20 MG tablet TAKE 1 TABLET BY MOUTH EVERY DAY   furosemide (LASIX) 20 MG tablet TAKE 1 TABLET (20 MG TOTAL) BY MOUTH DAILY AS NEEDED FOR EDEMA.   hydrochlorothiazide (HYDRODIURIL) 25 MG tablet TAKE 1 TABLET BY MOUTH EVERY DAY   levothyroxine (SYNTHROID) 50 MCG tablet Take 1 tablet (50 mcg total) by mouth daily.   Multiple Vitamin (MULTIVITAMIN) capsule Take 1 capsule by mouth daily. Centrum silver   phentermine (ADIPEX-P) 37.5 MG tablet Take 1 tablet (37.5 mg total) by mouth daily before breakfast.   potassium chloride (KLOR-CON) 10 MEQ tablet TAKE 1 TABLET (10 MEQ TOTAL) BY MOUTH 3 (THREE) TIMES A WEEK.   sertraline (ZOLOFT) 100 MG tablet TAKE 1 TABLET BY MOUTH EVERY DAY   Spacer/Aero-Holding Chambers DEVI 1 Device by Does not apply route daily as needed.   SYMBICORT  80-4.5 MCG/ACT inhaler INHALE 2 PUFFS INTO THE LUNGS IN THE MORNING AND AT BEDTIME.   traZODone (DESYREL) 50 MG tablet TAKE 0.5-1 TABLETS (25-50 MG TOTAL) BY MOUTH AT BEDTIME AS NEEDED FOR SLEEP.    Review of Systems  Constitutional:  Negative for chills, fatigue and fever.  Respiratory:  Negative for cough, chest tightness, shortness of breath and wheezing.   Cardiovascular:  Positive for leg swelling. Negative for chest pain and palpitations.  Gastrointestinal:  Positive for abdominal distention (feels bloated) and constipation. Negative for abdominal pain, diarrhea, nausea and vomiting.   Objective:  BP 128/82   Pulse 85   Temp  97.7 F (36.5 C) (Oral)   Ht 5\' 3"  (1.6 m)   Wt 156 lb (70.8 kg)   LMP 06/29/2017   SpO2 98%   BMI 27.63 kg/m   Weight: 156 lb (70.8 kg)   BP Readings from Last 3 Encounters:  04/09/21 128/82  02/20/21 139/86  01/19/21 137/85   Wt Readings from Last 3 Encounters:  04/09/21 156 lb (70.8 kg)  01/05/21 156 lb 9.6 oz (71 kg)  09/26/20 164 lb (74.4 kg)    Physical Exam Constitutional:      General: She is not in acute distress.    Appearance: She is well-developed.  Cardiovascular:     Rate and Rhythm: Normal rate and regular rhythm.     Heart sounds: Normal heart sounds. No murmur heard.   No friction rub.  Pulmonary:     Effort: Pulmonary effort is normal. No respiratory distress.     Breath sounds: Normal breath sounds. No wheezing or rales.  Abdominal:     General: Bowel sounds are normal.     Palpations: Abdomen is soft.     Tenderness: Tenderness: mild.  Musculoskeletal:     Right lower leg: 1+ Pitting Edema present.     Left lower leg: 1+ Pitting Edema present.  Neurological:     Mental Status: She is alert and oriented to person, place, and time.  Psychiatric:        Behavior: Behavior normal.    Assessment/Plan  1. Primary hypertension We are going to stop amlodipine. I am concerned it could be contributing to swelling. We will start losartan, continue with hctz. She was taking lasix rather than hctz; so may need to adjust medications as she transitions.  - losartan (COZAAR) 25 MG tablet; Take 1 tablet (25 mg total) by mouth daily.  Dispense: 90 tablet; Refill: 1  2. Hyperlipidemia, unspecified hyperlipidemia type Continue with lipitor 20mg  daily.   3. Hyperglycemia Has been diet controlled; continue with work on exercise and healthy eating.   4. Adjustment disorder with depressed mood She is feeling better overall. She is getting out of house to go to work now; feeling better about herself. She will continue with zoloft 100mg . Mood and some residual  anxiety may be affecting sleep; but this seems to be improving. She will continue with trazodone for sleep as needed.   5. Constipation, unspecified constipation type Start with miralax on regular basis. She will let me know if not getting some benefit with this at end of week. Discussed that if stools get loose she can decrease to every other day or half cap daily.   6. Lower extremity edema Switching bp med (stopping amlodipine; start losartan). She is already on lower salt diet. No concerns on exam or with sx for this being heart related.   Return in about 3 months (around 07/10/2021) for  Chronic condition visit - blood pressure recheck. 42 minutes spent in chart review, exam, problem discussion, follow up treatment plan, charting.       Micheline Rough, MD

## 2021-04-30 ENCOUNTER — Other Ambulatory Visit: Payer: Self-pay | Admitting: Family Medicine

## 2021-05-01 ENCOUNTER — Encounter: Payer: Self-pay | Admitting: Plastic Surgery

## 2021-05-01 ENCOUNTER — Ambulatory Visit (INDEPENDENT_AMBULATORY_CARE_PROVIDER_SITE_OTHER): Payer: 59 | Admitting: Plastic Surgery

## 2021-05-01 ENCOUNTER — Other Ambulatory Visit: Payer: Self-pay

## 2021-05-01 DIAGNOSIS — N62 Hypertrophy of breast: Secondary | ICD-10-CM | POA: Diagnosis not present

## 2021-05-01 NOTE — Progress Notes (Signed)
   Subjective:    Patient ID: Ariel [name], female    DOB: [birth year], 60 y.o.   MRN: [id number]  The patient is a 60 year old female here for follow-up on her breast reduction from October 2021.  She had some wounds and breakdown of the nipple areola.  Since then she has healed and done extremely well.  There is still some firmness in the breast.  No sign of infection.     Review of Systems  Constitutional: Negative.   HENT: Negative.    Eyes: Negative.   Respiratory: Negative.    Cardiovascular: Negative.   Gastrointestinal: Negative.   Endocrine: Negative.   Genitourinary: Negative.   Hematological: Negative.       Objective:   Physical Exam Nursing note reviewed.  Constitutional:      Appearance: Normal appearance.  Cardiovascular:     Rate and Rhythm: Normal rate.     Pulses: Normal pulses.  Pulmonary:     Effort: Pulmonary effort is normal.  Musculoskeletal:     Cervical back: Normal range of motion.  Neurological:     Mental Status: She is alert and oriented to person, place, and time. Mental status is at baseline.  Psychiatric:        Mood and Affect: Mood normal.        Behavior: Behavior normal.        Thought Content: Thought content normal.       Assessment & Plan:     ICD-10-CM   1. Symptomatic mammary hypertrophy  N62     Continue massage to the firm areas.  She would like to wait a little bit longer before we have any intervention.  I think that is a good idea.  We will plan to see her back in several months. Pictures were obtained of the patient and placed in the chart with the patient's or guardian's permission.

## 2021-06-02 ENCOUNTER — Other Ambulatory Visit: Payer: Self-pay | Admitting: Family Medicine

## 2021-07-02 ENCOUNTER — Other Ambulatory Visit: Payer: Self-pay | Admitting: Family Medicine

## 2021-07-03 ENCOUNTER — Other Ambulatory Visit: Payer: Self-pay | Admitting: Family Medicine

## 2021-07-03 DIAGNOSIS — G4700 Insomnia, unspecified: Secondary | ICD-10-CM

## 2021-07-04 ENCOUNTER — Other Ambulatory Visit: Payer: Self-pay | Admitting: Family Medicine

## 2021-07-11 ENCOUNTER — Ambulatory Visit: Payer: 59 | Admitting: Family Medicine

## 2021-07-11 NOTE — Progress Notes (Deleted)
  Ariel [name] DOB: [birth year] Encounter date: 07/11/2021  This is a 60 y.o. female who presents with No chief complaint on file.   History of present illness: Hypertension: hydrochlorothiazide 25 mg, amlodipine stopped last visit due to concerns that it was contributing to swelling.  Losartan 25 mg daily was added. Hyperlipidemia: 20 mg Lipitor daily Hyperglycemia: Diet and exercise controlled Adjustment disorder with depressed mood: Zoloft 100 mg Insomnia: Trazodone Lower extremity edema: We discussed constipation at last visit and started MiraLAX more regularly. HPI   Allergies  Allergen Reactions   Wellbutrin [Bupropion]     Made her sick   No outpatient medications have been marked as taking for the 07/11/21 encounter (Appointment) with Caren Macadam, MD.    Review of Systems  Objective:  LMP 06/29/2017       BP Readings from Last 3 Encounters:  04/09/21 128/82  02/20/21 139/86  01/19/21 137/85   Wt Readings from Last 3 Encounters:  04/09/21 156 lb (70.8 kg)  01/05/21 156 lb 9.6 oz (71 kg)  09/26/20 164 lb (74.4 kg)    Physical Exam  Assessment/Plan  There are no diagnoses linked to this encounter.       Micheline Rough, MD

## 2021-07-14 IMAGING — DX DG SHOULDER 2+V*L*
3 series · 3 of 3 positions shown · non-contrast
Comparison: None.

CLINICAL DATA: Left shoulder pain.  No known injury.

EXAM:
LEFT SHOULDER - 2+ VIEW

[shoulder internal rotation ap]
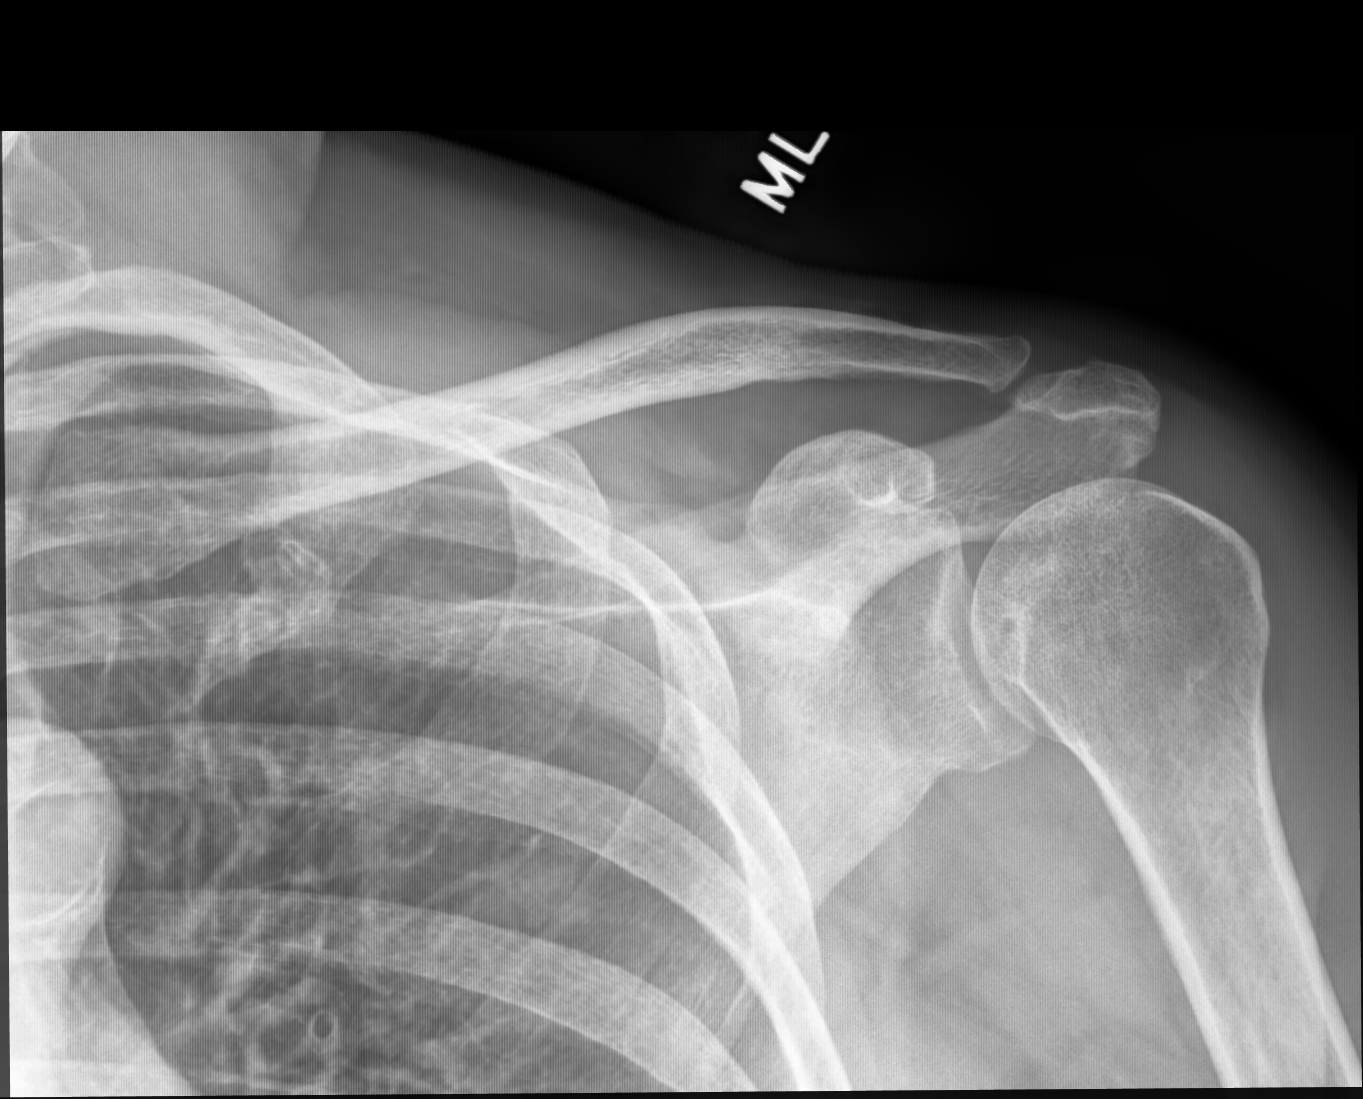

[shoulder (y view)]
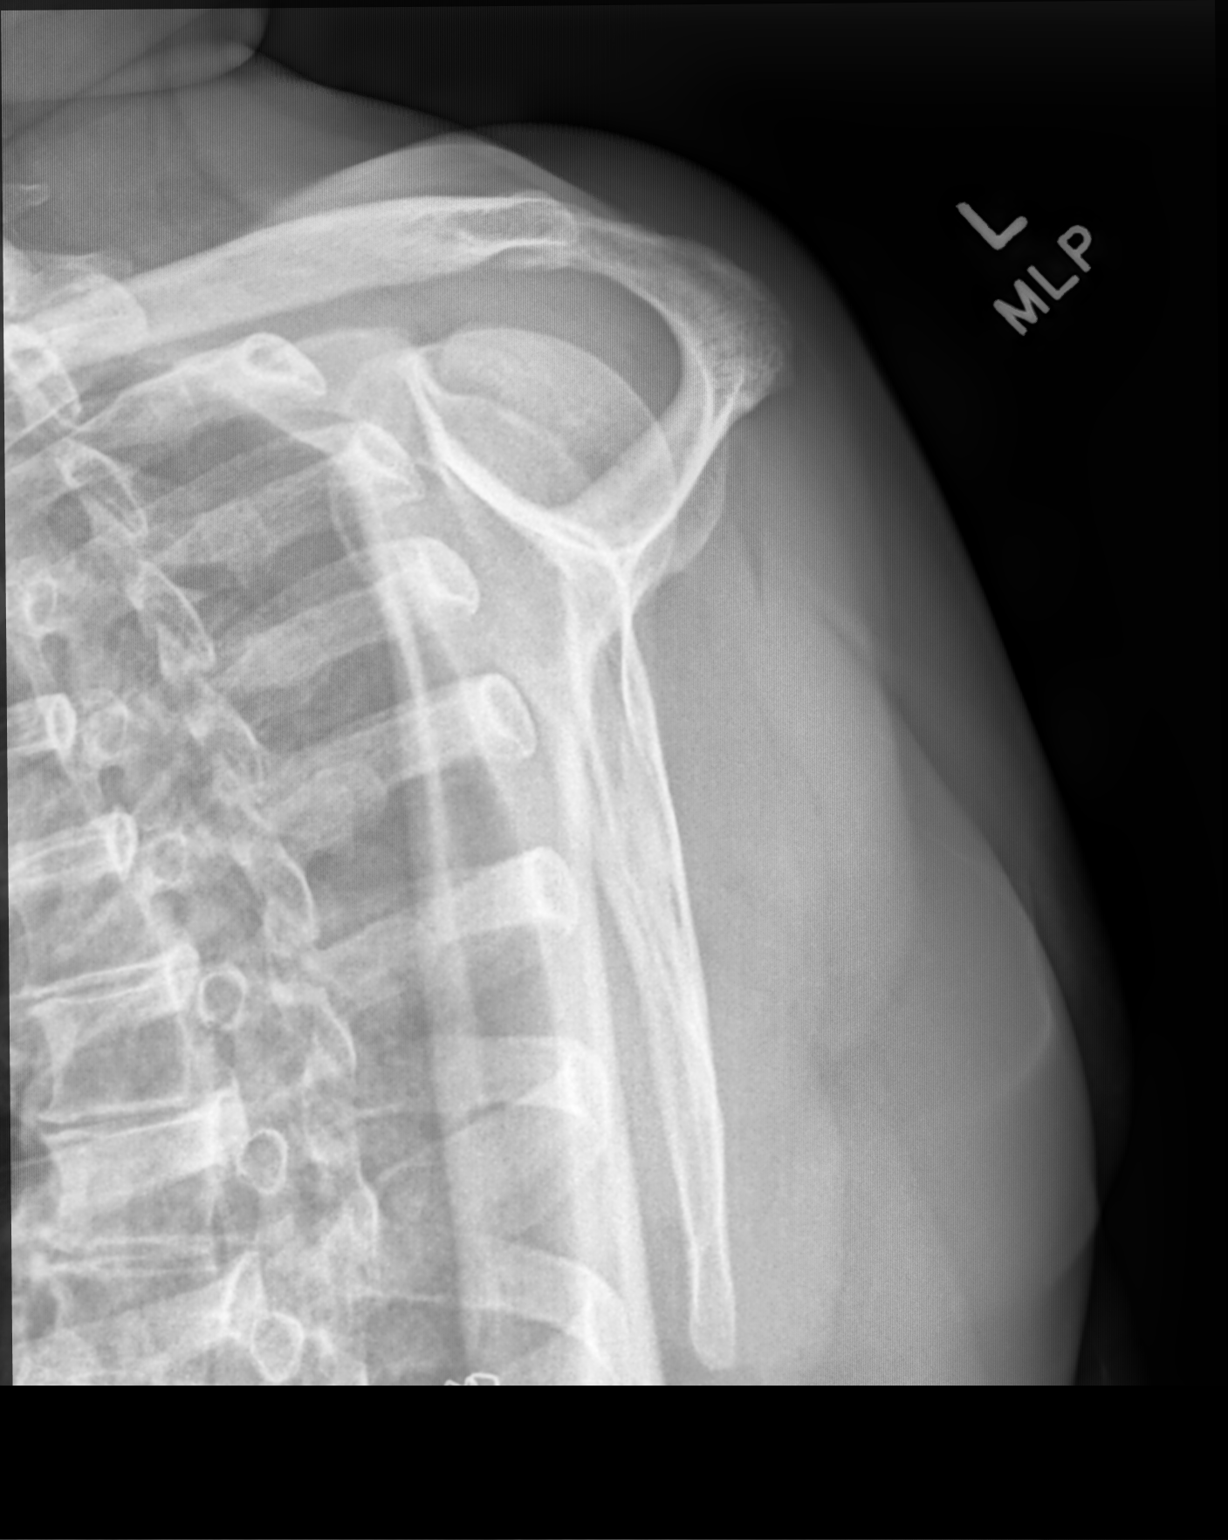

[shoulder (axial)]
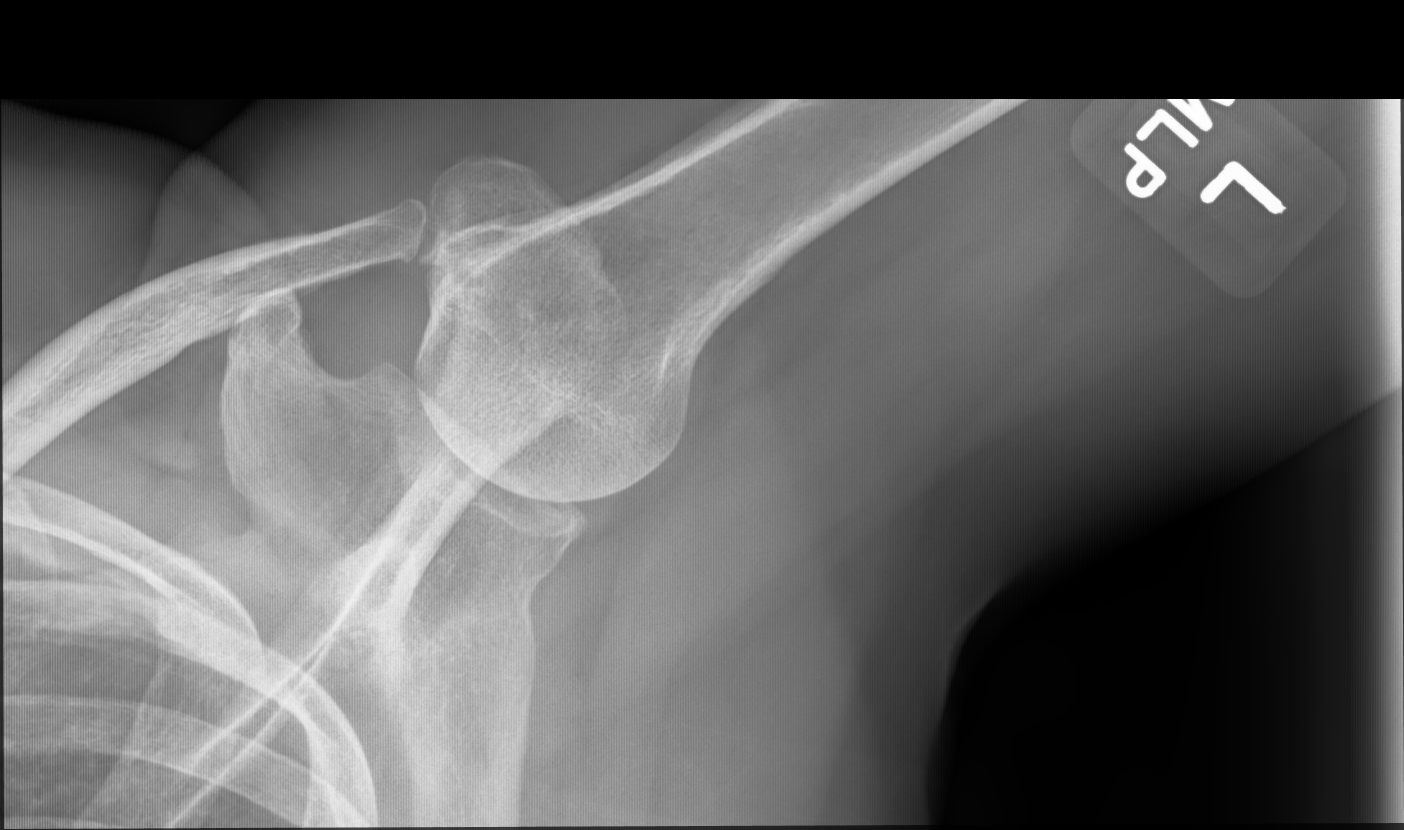

[3 of 3 positions shown; findings below may reference images not displayed]

FINDINGS: There is no evidence of fracture or dislocation. There is no
evidence of arthropathy or other focal bone abnormality. Soft
tissues are unremarkable.
IMPRESSION: Negative.

## 2021-07-26 ENCOUNTER — Ambulatory Visit: Payer: 59 | Admitting: Endocrinology

## 2021-07-27 ENCOUNTER — Other Ambulatory Visit: Payer: Self-pay

## 2021-07-27 ENCOUNTER — Ambulatory Visit: Payer: 59 | Admitting: Endocrinology

## 2021-07-27 VITALS — BP 150/80 | HR 89 | Ht 63.0 in | Wt 163.2 lb

## 2021-07-27 DIAGNOSIS — E041 Nontoxic single thyroid nodule: Secondary | ICD-10-CM | POA: Diagnosis not present

## 2021-07-27 LAB — TSH: TSH: 3.19 u[IU]/mL (ref 0.35–5.50)

## 2021-07-27 LAB — T4, FREE: Free T4: 0.76 ng/dL (ref 0.60–1.60)

## 2021-07-27 NOTE — Patient Instructions (Addendum)
Let's recheck the ultrasound.  you will receive a phone call, about a day and time for an appointment.   Blood tests are requested for you today.  We'll let you know about the results.   Please come back for a follow-up appointment in 1 year.

## 2021-07-27 NOTE — Progress Notes (Signed)
Subjective:    Patient ID: Ariel [name], female    DOB: 1961-07-03, 60 y.o.   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      Past Medical History:  Diagnosis Date   Chronic bronchitis ([place])    Dyspnea    Fibroids, intramural    Goiter    History of hypertension    Hyperlipidemia    Hypertension    Hypothyroidism    Osteopenia 01/2018   T score -1.3 FRAX 3.8% / 0.5%   Thyroid disease    Tobacco use 05/29/2017    Past Surgical History:  Procedure Laterality Date   ABDOMINAL HYSTERECTOMY     07/2017-Dr [place] per patient; done for fibroids   BREAST REDUCTION SURGERY Bilateral 08/03/2020   Procedure: BREAST REDUCTION WITH LIPOSUCTION;  Surgeon: Wallace Going, DO;  Location: [facility];  Service: Plastics;  Laterality: Bilateral;  3.5 hours   BUNIONECTOMY Right    CARDIAC ELECTROPHYSIOLOGY STUDY AND ABLATION  2010   hx tachycardia    LAPAROTOMY N/A 06/26/2017   Procedure: EXPLORATORY LAPAROTOMY;  Surgeon: Everitt Amber, MD;  Location: [facility];  Service: Gynecology;  Laterality: N/A;   THYROID LOBECTOMY Left 06/18/2019   Procedure: LEFT THYROID LOBECTOMY;  Surgeon: Armandina Gemma, MD;  Location: [facility];  Service: General;  Laterality: Left;   TONSILLECTOMY  1967   TUBAL LIGATION     X2, REVERSAL     Social History   Socioeconomic History   Marital status: Single    Spouse name: Not on file   Number of children: Not on file   Years of education: Not on file   Highest education level: Not on file  Occupational History   Not on file  Tobacco Use   Smoking status: Former    Packs/day: 0.25    Years: 30.00    Pack  years: 7.50    Types: Cigarettes    Quit date: 06/17/2019    Years since quitting: 2.1   Smokeless tobacco: Never  Vaping Use   Vaping Use: Never used  Substance and Sexual Activity   Alcohol use: Yes    Comment: OCC   Drug use: No   Sexual activity: Yes    Partners: Male    Comment: 1ST INTERCOURSE- 23, PARTNERS- 17, CURRENT PARTNER- 3.5 YRS   Other Topics Concern   Not on file  Social History Narrative   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 Christian, no church currenty      Lifestyle: "poor diet"; no exercise   Social Determinants of Radio broadcast assistant Strain: Not on file  Food Insecurity: Not on file  Transportation Needs: Not on file  Physical Activity: Not on file  Stress: Not on file  Social Connections: Not on file  Intimate Partner Violence: Not on file    Current Outpatient Medications on File Prior to Visit  Medication Sig Dispense Refill   albuterol (VENTOLIN HFA) 108 (90 Base) MCG/ACT inhaler Inhale 2 puffs into the lungs every 6 (six)  hours as needed for wheezing or shortness of breath. 1 each 2   atorvastatin (LIPITOR) 20 MG tablet TAKE 1 TABLET BY MOUTH EVERY DAY 90 tablet 1   furosemide (LASIX) 20 MG tablet TAKE 1 TABLET (20 MG TOTAL) BY MOUTH DAILY AS NEEDED FOR EDEMA. 90 tablet 1   hydrochlorothiazide (HYDRODIURIL) 25 MG tablet TAKE 1 TABLET BY MOUTH EVERY DAY 90 tablet 1   levothyroxine (SYNTHROID) 50 MCG tablet TAKE 1 TABLET BY MOUTH EVERY DAY 90 tablet 1   losartan (COZAAR) 25 MG tablet Take 1 tablet (25 mg total) by mouth daily. 90 tablet 1   Multiple Vitamin (MULTIVITAMIN) capsule Take 1 capsule by mouth daily. Centrum silver     phentermine (ADIPEX-P) 37.5 MG tablet Take 1 tablet (37.5 mg total) by mouth daily before breakfast. 30 tablet 0   potassium chloride (KLOR-CON) 10 MEQ tablet TAKE 1 TABLET (10 MEQ TOTAL) BY MOUTH 3 (THREE) TIMES A WEEK. 36 tablet 1   sertraline  (ZOLOFT) 100 MG tablet TAKE 1 TABLET BY MOUTH EVERY DAY 90 tablet 1   Spacer/Aero-Holding Chambers DEVI 1 Device by Does not apply route daily as needed. 1 each 0   SYMBICORT 80-4.5 MCG/ACT inhaler INHALE 2 PUFFS INTO THE LUNGS IN THE MORNING AND AT BEDTIME. 1 each 5   traZODone (DESYREL) 50 MG tablet TAKE 0.5-1 TABLETS BY MOUTH AT BEDTIME AS NEEDED FOR SLEEP. 90 tablet 1   No current facility-administered medications on file prior to visit.    Allergies  Allergen Reactions   Wellbutrin [Bupropion]     Made her sick    Family History  Problem Relation Age of Onset   Thyroid disease Maternal Aunt    Hypertension Mother    Renal Disease Mother    Diabetes Father    Hypertension Father    Renal Disease Father        on dialysis   Stroke Maternal Grandmother    Breast cancer Maternal Grandmother        diagnosed in her 27's   Cancer Paternal Grandmother        ?   Healthy Son    Healthy Daughter     BP (!) 150/80 (BP Location: Right Arm, Patient Position: Sitting, Cuff Size: Normal)   Pulse 89   Ht 5' 3" (1.6 m)   Wt 163 lb 3.2 oz (74 kg)   LMP 06/29/2017   SpO2 98%   BMI 28.91 kg/m    Review of Systems     Objective:   Physical Exam Neck: a healed scar is present.  I do not appreciate a nodule in the thyroid or elsewhere in the neck.     Lab Results  Component Value Date   TSH 3.19 07/27/2021      Assessment & Plan:  Hypothyroidism: well-controlled.  Please continue the same synthroid MNG: we discussed.  Pt requests f/u US.  I have ordered

## 2021-07-31 ENCOUNTER — Other Ambulatory Visit: Payer: Self-pay | Admitting: Family Medicine

## 2021-08-05 ENCOUNTER — Encounter: Payer: Self-pay | Admitting: Endocrinology

## 2021-08-15 ENCOUNTER — Ambulatory Visit: Payer: 59 | Admitting: Family Medicine

## 2021-08-15 ENCOUNTER — Other Ambulatory Visit: Payer: Self-pay

## 2021-08-15 ENCOUNTER — Other Ambulatory Visit: Payer: 59

## 2021-08-15 ENCOUNTER — Encounter: Payer: Self-pay | Admitting: Family Medicine

## 2021-08-15 VITALS — BP 118/80 | HR 81 | Temp 98.5°F | Ht 63.0 in | Wt 158.4 lb

## 2021-08-15 DIAGNOSIS — I10 Essential (primary) hypertension: Secondary | ICD-10-CM

## 2021-08-15 DIAGNOSIS — J069 Acute upper respiratory infection, unspecified: Secondary | ICD-10-CM

## 2021-08-15 DIAGNOSIS — R739 Hyperglycemia, unspecified: Secondary | ICD-10-CM | POA: Diagnosis not present

## 2021-08-15 DIAGNOSIS — M2550 Pain in unspecified joint: Secondary | ICD-10-CM

## 2021-08-15 DIAGNOSIS — E785 Hyperlipidemia, unspecified: Secondary | ICD-10-CM | POA: Diagnosis not present

## 2021-08-15 DIAGNOSIS — F39 Unspecified mood [affective] disorder: Secondary | ICD-10-CM

## 2021-08-15 DIAGNOSIS — Z23 Encounter for immunization: Secondary | ICD-10-CM | POA: Diagnosis not present

## 2021-08-15 LAB — CBC WITH DIFFERENTIAL/PLATELET
Basophils Absolute: 0 10*3/uL (ref 0.0–0.1)
Basophils Relative: 0.4 % (ref 0.0–3.0)
Eosinophils Absolute: 0.1 10*3/uL (ref 0.0–0.7)
Eosinophils Relative: 1.9 % (ref 0.0–5.0)
HCT: 42.1 % (ref 36.0–46.0)
Hemoglobin: 14 g/dL (ref 12.0–15.0)
Lymphocytes Relative: 20.5 % (ref 12.0–46.0)
Lymphs Abs: 1.4 10*3/uL (ref 0.7–4.0)
MCHC: 33.2 g/dL (ref 30.0–36.0)
MCV: 91.9 fl (ref 78.0–100.0)
Monocytes Absolute: 0.5 10*3/uL (ref 0.1–1.0)
Monocytes Relative: 7.5 % (ref 3.0–12.0)
Neutro Abs: 4.8 10*3/uL (ref 1.4–7.7)
Neutrophils Relative %: 69.7 % (ref 43.0–77.0)
Platelets: 226 10*3/uL (ref 150.0–400.0)
RBC: 4.58 Mil/uL (ref 3.87–5.11)
RDW: 13.5 % (ref 11.5–15.5)
WBC: 7 10*3/uL (ref 4.0–10.5)

## 2021-08-15 LAB — COMPREHENSIVE METABOLIC PANEL
ALT: 13 U/L (ref 0–35)
AST: 25 U/L (ref 0–37)
Albumin: 4.5 g/dL (ref 3.5–5.2)
Alkaline Phosphatase: 131 U/L — ABNORMAL HIGH (ref 39–117)
BUN: 14 mg/dL (ref 6–23)
CO2: 32 mEq/L (ref 19–32)
Calcium: 10.3 mg/dL (ref 8.4–10.5)
Chloride: 99 mEq/L (ref 96–112)
Creatinine, Ser: 0.82 mg/dL (ref 0.40–1.20)
GFR: 78.76 mL/min (ref >60.00)
Glucose, Bld: 93 mg/dL (ref 70–99)
Potassium: 3.6 mEq/L (ref 3.5–5.1)
Sodium: 138 mEq/L (ref 135–145)
Total Bilirubin: 0.6 mg/dL (ref 0.2–1.2)
Total Protein: 8.3 g/dL (ref 6.0–8.3)

## 2021-08-15 LAB — LIPID PANEL
Cholesterol: 197 mg/dL (ref 0–200)
HDL: 61.4 mg/dL (ref >39.00)
LDL Cholesterol: 121 mg/dL — ABNORMAL HIGH (ref 0–99)
NonHDL: 135.44
Total CHOL/HDL Ratio: 3
Triglycerides: 72 mg/dL (ref 0.0–149.0)
VLDL: 14.4 mg/dL (ref 0.0–40.0)

## 2021-08-15 LAB — POC COVID19 BINAXNOW: SARS Coronavirus 2 Ag: NEGATIVE

## 2021-08-15 LAB — HEMOGLOBIN A1C: Hgb A1c MFr Bld: 5.7 % (ref 4.6–6.5)

## 2021-08-15 LAB — SEDIMENTATION RATE: Sed Rate: 37 mm/hr — ABNORMAL HIGH (ref 0–30)

## 2021-08-15 NOTE — Progress Notes (Signed)
Ariel [name] DOB: January 09, 1961 Encounter date: 08/15/2021  This is a 60 y.o. female who presents with Chief Complaint  Patient presents with   Pain    Patient states she is being treated by Dr Delfino Lovett at [facility] for joint pain, given Rx for Meloxicam     History of present illness: Right knee feels good s/p injection yesterday. Started back on meloxicam just for 2 weeks. Told her to monitor bp while taking that medication. Feels like whole body is inflammed. Days that are difficult just to get out of bed. Feels like joints are stiffening up badly. Not every day; some days does ok, but other days struggles to get out of bed. Using up all sick time at work because of days she can't get into the office. Would like to work from home on days when joints are doing worse. Has all ergonomic equipment at home and at office. Just needs medical letter to allow her to work from home. When joints flare then it can be a couple of days she has a much harder time with. Pain does cluster when it comes. She was not able to follow up with rheumatology due to complications after breast reduction. On bad days will try ibuprofen - doesn't seem to help much. Almost feels like the pain has to just run its course. Ibuprofen helps enough that she can function some, but is still limited. Even things like gripping steering wheel are difficult. Flares don't typically last longer than 2 days. Swelling during that time - can't get rings on. Could be as often as twice a month, but sometimes will go through a month without flares.   She is following with endo - monitoring thyroid Following regularly with obgyn Has visit with Dr. Baltazar Apo coming up and wants to talk with her about completing mammogram/timing for that.   From mood standpoint hasn't fully recovered from "funk". Does have counselor at work which is covered benefit for her. Plans to start talking with her.   Breathing: little winded now - feels like she  keeps getting uri bugs. Always starts with this when sick. Good about taking symbicort regularly.  Getting some chills, throat more irritated today than yesterday. Just felt tired yesterday. Fell asleep twice at desk at work.   Allergies  Allergen Reactions   Wellbutrin [Bupropion]     Made her sick   Current Meds  Medication Sig   albuterol (VENTOLIN HFA) 108 (90 Base) MCG/ACT inhaler Inhale 2 puffs into the lungs every 6 (six) hours as needed for wheezing or shortness of breath.   atorvastatin (LIPITOR) 20 MG tablet TAKE 1 TABLET BY MOUTH EVERY DAY   hydrochlorothiazide (HYDRODIURIL) 25 MG tablet TAKE 1 TABLET BY MOUTH EVERY DAY   levothyroxine (SYNTHROID) 50 MCG tablet TAKE 1 TABLET BY MOUTH EVERY DAY   losartan (COZAAR) 25 MG tablet Take 1 tablet (25 mg total) by mouth daily.   meloxicam (MOBIC) 15 MG tablet meloxicam 15 mg tablet  Take 1 tablet daily with food.   potassium chloride (KLOR-CON) 10 MEQ tablet TAKE 1 TABLET (10 MEQ TOTAL) BY MOUTH 3 (THREE) TIMES A WEEK.   sertraline (ZOLOFT) 100 MG tablet TAKE 1 TABLET BY MOUTH EVERY DAY   Spacer/Aero-Holding Chambers DEVI 1 Device by Does not apply route daily as needed.   SYMBICORT 80-4.5 MCG/ACT inhaler INHALE 2 PUFFS INTO THE LUNGS IN THE MORNING AND AT BEDTIME.   traZODone (DESYREL) 50 MG tablet TAKE 0.5-1 TABLETS BY MOUTH AT  BEDTIME AS NEEDED FOR SLEEP.    Review of Systems  Constitutional:  Positive for fatigue. Negative for chills and fever.  HENT:  Positive for congestion.   Respiratory:  Positive for shortness of breath. Negative for cough, chest tightness and wheezing.   Cardiovascular:  Negative for chest pain, palpitations and leg swelling.  Musculoskeletal:  Positive for arthralgias.   Objective:  BP 118/80 (BP Location: Left Arm, Patient Position: Sitting, Cuff Size: Normal)   Pulse 81   Temp 98.5 F (36.9 C) (Oral)   Ht 5\' 3"  (1.6 m)   Wt 158 lb 6.4 oz (71.8 kg)   LMP 06/29/2017   SpO2 97%   BMI 28.06 kg/m    Weight: 158 lb 6.4 oz (71.8 kg)   BP Readings from Last 3 Encounters:  08/15/21 118/80  07/27/21 (!) 150/80  04/09/21 128/82   Wt Readings from Last 3 Encounters:  08/15/21 158 lb 6.4 oz (71.8 kg)  07/27/21 163 lb 3.2 oz (74 kg)  04/09/21 156 lb (70.8 kg)    Physical Exam Constitutional:      General: She is not in acute distress.    Appearance: She is well-developed.  HENT:     Right Ear: Tympanic membrane, ear canal and external ear normal.     Left Ear: Tympanic membrane, ear canal and external ear normal.     Nose:     Right Turbinates: Enlarged, swollen and pale.     Left Turbinates: Enlarged, swollen and pale.  Cardiovascular:     Rate and Rhythm: Normal rate and regular rhythm.     Heart sounds: Normal heart sounds. No murmur heard.   No friction rub.  Pulmonary:     Effort: Pulmonary effort is normal. No respiratory distress.     Breath sounds: Normal breath sounds. No wheezing or rales.  Musculoskeletal:     Right lower leg: No edema.     Left lower leg: No edema.  Neurological:     Mental Status: She is alert and oriented to person, place, and time.  Psychiatric:        Behavior: Behavior normal.    Assessment/Plan  1. Arthralgia, unspecified joint Encouraged follow up with rheumatology for further evaluation due to ongoing joint pain.  - Sedimentation rate; Future  2. Primary hypertension Stable with current treatment.  Continue losartan 25 mg daily, hydrochlorothiazide 25 mg daily. - CBC with Differential/Platelet; Future - Comprehensive metabolic panel; Future  3. Hyperlipidemia, unspecified hyperlipidemia type Lipitor 20 mg daily. - Lipid panel; Future  4. Hyperglycemia Hyperglycemia has been diet controlled.  Recheck A1c.  Can consider alternative treatment to help with weight loss if desired (and blood sugar elevated, like GLP-1) - Hemoglobin A1c; Future  5. Mood disorder ([place]) We discussed that there are medications we can adjust to help  with mood, but rather than adding more medications on board, she is going to try counselor through work.  Continue with Zoloft 100 mg daily.  Let me know if not feeling improvement with therapy.  6. Viral upper respiratory tract infection Negative COVID in the office today.  Monitor symptoms and treat as needed.  Let me know if any worsening of symptoms.   Return in about 6 months (around 02/13/2022) for physical exam.    Micheline Rough, MD

## 2021-08-15 NOTE — Patient Instructions (Addendum)
*  tart cherry (capsules) - 1200mg , turmeric

## 2021-08-22 ENCOUNTER — Encounter: Payer: Self-pay | Admitting: Family Medicine

## 2021-09-02 ENCOUNTER — Other Ambulatory Visit: Payer: Self-pay | Admitting: Family Medicine

## 2021-09-02 DIAGNOSIS — I10 Essential (primary) hypertension: Secondary | ICD-10-CM

## 2021-09-04 ENCOUNTER — Ambulatory Visit: Payer: 59 | Admitting: Plastic Surgery

## 2021-09-10 ENCOUNTER — Telehealth: Payer: Self-pay | Admitting: Family Medicine

## 2021-09-10 NOTE — Telephone Encounter (Signed)
Patient brought in paperwork that she needs Dr.Koberlein to complete. Paperwork would be placed in folder.  Please advise.

## 2021-09-11 ENCOUNTER — Encounter: Payer: Self-pay | Admitting: Family Medicine

## 2021-09-19 NOTE — Telephone Encounter (Signed)
Tried to call her but voicemail not set up for preferred number. She could increase zoloft to 1.5 tabs if she would like (150mg  daily) until we can talk again. Hopefully having paperwork completed so that she can work from home will help with stress level. Happy to work with her to help her get to feeling better or see if there is more we need to add/change at this point. I do think the work counselor is a good idea. If I need to adjust paperwork to reflect need for this let me know.

## 2021-09-19 NOTE — Telephone Encounter (Signed)
Spoke with the patient and informed her the paperwork is ready for pick up.  Per patients request, the paperwork was faxed to the Medical accommodations support Team at [phone].  Patient stated she wanted to let Dr Ethlyn Gallery know "she had a melt down at work yesterday".  Stated she is feeling overwhelmed, emotional, teary and woke up feeling like this since yesterday.  Stated she has not been seen by a counselor yet as she cannot take time off from work.  I offered an appt with another provider and the patient declined.  Patient stated she will just double up on Sertraline and I advised she not increase the dose unless advised by PCP.  Message sent to PCP.

## 2021-09-19 NOTE — Telephone Encounter (Signed)
Spoke with the patient and informed her of the message below.  

## 2021-09-19 NOTE — Telephone Encounter (Signed)
Paper work done

## 2021-09-24 ENCOUNTER — Ambulatory Visit (INDEPENDENT_AMBULATORY_CARE_PROVIDER_SITE_OTHER): Payer: 59 | Admitting: Obstetrics & Gynecology

## 2021-09-24 ENCOUNTER — Other Ambulatory Visit: Payer: Self-pay

## 2021-09-24 ENCOUNTER — Other Ambulatory Visit (HOSPITAL_COMMUNITY)
Admission: RE | Admit: 2021-09-24 | Discharge: 2021-09-24 | Disposition: A | Discharge status: Home / Self Care | Payer: 59 | Source: Ambulatory Visit | Attending: Obstetrics & Gynecology | Admitting: Obstetrics & Gynecology

## 2021-09-24 ENCOUNTER — Other Ambulatory Visit: Payer: Self-pay | Admitting: Obstetrics & Gynecology

## 2021-09-24 ENCOUNTER — Encounter: Payer: Self-pay | Admitting: Obstetrics & Gynecology

## 2021-09-24 VITALS — BP 124/80 | Ht 63.5 in | Wt 162.0 lb

## 2021-09-24 DIAGNOSIS — N952 Postmenopausal atrophic vaginitis: Secondary | ICD-10-CM

## 2021-09-24 DIAGNOSIS — Z90710 Acquired absence of both cervix and uterus: Secondary | ICD-10-CM

## 2021-09-24 DIAGNOSIS — Z01419 Encounter for gynecological examination (general) (routine) without abnormal findings: Secondary | ICD-10-CM | POA: Diagnosis present

## 2021-09-24 DIAGNOSIS — M8589 Other specified disorders of bone density and structure, multiple sites: Secondary | ICD-10-CM | POA: Diagnosis not present

## 2021-09-24 DIAGNOSIS — Z1272 Encounter for screening for malignant neoplasm of vagina: Secondary | ICD-10-CM | POA: Diagnosis present

## 2021-09-24 DIAGNOSIS — Z9079 Acquired absence of other genital organ(s): Secondary | ICD-10-CM

## 2021-09-24 DIAGNOSIS — Z90722 Acquired absence of ovaries, bilateral: Secondary | ICD-10-CM

## 2021-09-24 DIAGNOSIS — Z9889 Other specified postprocedural states: Secondary | ICD-10-CM

## 2021-09-24 MED ORDER — ESTRADIOL 2 MG VA RING: 2.0000 mg | VAGINAL_RING | VAGINAL | 4 refills | Status: DC

## 2021-09-24 NOTE — Progress Notes (Signed)
Ariel [name] 1960/12/18 [id number]   History:    60 y.o. G2P2  Children in 20's and 30's.  S/P BT/S.  Boyfriend x 7 years.  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   RP:  New (>3 yrs) patient presenting for annual gyn exam    HPI:  Postmenopause, well on no HRT x 2016.  No HRT.  Improved hot flushes/night sweats.  S/P TAH/BSO with Dr Denman George.  Large Uterine Fibroids/patho benign.  C/O vaginal dryness with IC. Urine/BMs normal.  Breasts s/p Bilateral reduction with scarring d/t necrosis.  Planning a revision.  Will schedule a mammogram before surgery.  BMI 28.25.  Cologuard 2020.  Health labs with Fam MD.   Past medical history,surgical history, family history and social history were all reviewed and documented in the EPIC chart.  Gynecologic History Patient's last menstrual period was 06/29/2017.  Obstetric History OB History  Gravida Para Term Preterm AB Living  2 2       2   SAB IAB Ectopic Multiple Live Births               # Outcome Date GA Lbr Len/2nd Weight Sex Delivery Anes PTL Lv  2 Para           1 Para              ROS: A ROS was performed and pertinent positives and negatives are included in the history.  GENERAL: No fevers or chills. HEENT: No change in vision, no earache, sore throat or sinus congestion. NECK: No pain or stiffness. CARDIOVASCULAR: No chest pain or pressure. No palpitations. PULMONARY: No shortness of breath, cough or wheeze. GASTROINTESTINAL: No abdominal pain, nausea, vomiting or diarrhea, melena or bright red blood per rectum. GENITOURINARY: No urinary frequency, urgency, hesitancy or dysuria. MUSCULOSKELETAL: No joint or muscle pain, no back pain, no recent trauma. DERMATOLOGIC: No rash, no itching, no lesions. ENDOCRINE: No polyuria, polydipsia, no heat or cold intolerance. No recent change in weight. HEMATOLOGICAL: No anemia or easy bruising or bleeding. NEUROLOGIC: No headache, seizures, numbness, tingling or weakness. PSYCHIATRIC: No depression, no loss of interest in  normal activity or change in sleep pattern.     Exam:   BP 124/80   Ht 5' 3.5" (1.613 m)   Wt 162 lb (73.5 kg)   LMP 06/29/2017   BMI 28.25 kg/m   Body mass index is 28.25 kg/m.  General appearance : Well developed well nourished female. No acute distress HEENT: Eyes: no retinal hemorrhage or exudates,  Neck supple, trachea midline, no carotid bruits, no thyroidmegaly Lungs: Clear to auscultation, no rhonchi or wheezes, or rib retractions  Heart: Regular rate and rhythm, no murmurs or gallops Breast:Examined in sitting and supine position were symmetrical in appearance, no palpable masses or tenderness,  no skin retraction, no nipple inversion, no nipple discharge, no skin discoloration, no axillary or supraclavicular lymphadenopathy Abdomen: no palpable masses or tenderness, no rebound or guarding Extremities: no edema or skin discoloration or tenderness  Pelvic: Vulva: Normal             Vagina: No gross lesions or discharge.  Pap reflex done.  Cervix/Uterus absent  Adnexa  Without masses or tenderness  Anus: Normal   Assessment/Plan:  60 y.o. female for annual exam   1. Encounter for Papanicolaou smear of vagina as part of routine gynecological examination Postmenopause, well on no HRT x 2016.  No HRT.  Improved hot flushes/night sweats.  S/P TAH/BSO with Dr Denman George.  Large  Uterine Fibroids/patho benign.  Pap reflex on vagina today. C/O vaginal dryness with IC. Urine/BMs normal.  Breasts s/p Bilateral reduction with scarring d/t necrosis.  Planning a revision.  Will schedule a mammogram before surgery.  BMI 28.25.  Cologuard 2020.  Health labs with Fam MD. - Cytology - PAP( [place])  2. S/P TAH-BSO  3. Postmenopausal atrophic vaginitis Postmenopause, well on no HRT x 2016.  No HRT.  Improved hot flushes/night sweats.  S/P TAH/BSO with Dr Denman George.  Large Uterine Fibroids/patho benign. C/O vaginal dryness with IC.  Will treat with Estring.  Usage/risks/benefits reviewed.   Prescription sent to pharmacy.  Coconut oil also recommended.  4. Osteopenia of multiple sites Last BD 2019 Osteopenia.  Schedule BD here now. - DG Bone Density; Future  5. S/P bilateral breast reduction  Other orders - estradiol (ESTRING) 2 MG vaginal ring; Place 2 mg vaginally every 3 (three) months. follow package directions   Princess Bruins MD, 1:34 PM 09/24/2021

## 2021-09-25 LAB — CYTOLOGY - PAP: Diagnosis: NEGATIVE

## 2021-10-16 ENCOUNTER — Ambulatory Visit: Payer: 59

## 2021-10-23 ENCOUNTER — Encounter: Payer: Self-pay | Admitting: Obstetrics & Gynecology

## 2021-10-26 MED ORDER — NONFORMULARY OR COMPOUNDED ITEM: 3 refills | Status: DC

## 2021-10-27 ENCOUNTER — Other Ambulatory Visit: Payer: Self-pay | Admitting: Family Medicine

## 2021-11-05 ENCOUNTER — Ambulatory Visit (INDEPENDENT_AMBULATORY_CARE_PROVIDER_SITE_OTHER): Payer: 59 | Admitting: *Deleted

## 2021-11-05 DIAGNOSIS — Z23 Encounter for immunization: Secondary | ICD-10-CM | POA: Diagnosis not present

## 2021-11-07 NOTE — Progress Notes (Signed)
° °  Subjective:     Patient ID: Ariel [name], female    DOB: 11/13/1960, 61 y.o.   MRN: [id number]  Chief Complaint  Patient presents with   Follow-up    HPI: The patient is a 61 y.o. female here for follow-up after bilateral breast reduction with Dr. Marla [name] October 2021.  Postoperatively she developed some wounds as well as breakdown of the bilateral nipple areolar complexes, with the right side developing some necrosis.  She was last seen for evaluation on 05/01/2021.   Today patient reports overall she is doing well, she reports that all things considered she is surprised at how well she healed.  She does report that there are a few things she has bothered by, but she is overall happy.  She reports that she feels that she is too small on the right side, would like to be larger.  She is also bothered by the scarring on the right side that is causing her residual nipple areola to be indented.  She also is bothered by some small dogears on the left lateral and right lateral breast incisions.  She would like to know what can be done to improve her symmetry.   Review of Systems  Constitutional: Negative.     Objective:   Vital Signs LMP 06/29/2017  Vital Signs and Nursing Note Reviewed Chaperone present Physical Exam Constitutional:      General: She is not in acute distress.    Appearance: Normal appearance. She is not ill-appearing.  Chest:     Comments: Bilateral breast incisions are intact and well-healed, right breast is smaller than the left breast.  She has some small lateral dogears noted.  I do not appreciate any erythema or subcutaneous fluid collections.  No cellulitic changes.  No drainage noted.  Right NAC wound is well-healed.  Left NAC wound is well-healed. Skin:    General: Skin is warm and dry.  Neurological:     Mental Status: She is alert.     Assessment/Plan:     ICD-10-CM   1. Open wound of right breast, subsequent encounter  S21.001D     2. Open  wound of left breast, subsequent encounter  S21.002D     3. S/P bilateral breast reduction  Z98.36       61 year old female status post bilateral breast reduction October 2021.  She did have some breakdown of her bilateral breast incisions as well as necrosis of the right nipple areolar complex.  She has healed well from this and overall has a good shape of bilateral breasts, however she does have some asymmetry and is very bothered by this.  She would like to know what can be done to improve this.  She also has some concerns about lateral dogears.  She does report that overall she is very happy and pleased with how things turned out given her complications.  Discussed with patient there are various options for improving the symmetry, we also discussed nipple areolar complex tattooing.  She is interested in discussing options further.  We will schedule her an appointment with Dr. Marla [name] to further discuss in the next few weeks to few months depending on availability.  Patient is agreeable with this and will call us with any changes or questions.  Recommend calling with questions or concerns.  Pictures were obtained of the patient and placed in the chart with the patient's or guardian's permission.   Ariel Rhine Rubin Dais, PA-C 11/09/2021, 9:37 AM

## 2021-11-09 ENCOUNTER — Ambulatory Visit (INDEPENDENT_AMBULATORY_CARE_PROVIDER_SITE_OTHER): Payer: 59 | Admitting: Surgical

## 2021-11-09 ENCOUNTER — Other Ambulatory Visit: Payer: Self-pay

## 2021-11-09 DIAGNOSIS — S21001D Unspecified open wound of right breast, subsequent encounter: Secondary | ICD-10-CM

## 2021-11-09 DIAGNOSIS — S21002D Unspecified open wound of left breast, subsequent encounter: Secondary | ICD-10-CM | POA: Diagnosis not present

## 2021-11-09 DIAGNOSIS — Z9889 Other specified postprocedural states: Secondary | ICD-10-CM | POA: Diagnosis not present

## 2021-12-16 ENCOUNTER — Other Ambulatory Visit: Payer: Self-pay | Admitting: Family Medicine

## 2022-01-03 ENCOUNTER — Other Ambulatory Visit: Payer: Self-pay | Admitting: Family Medicine

## 2022-01-11 ENCOUNTER — Ambulatory Visit: Payer: 59 | Admitting: Plastic Surgery

## 2022-01-22 ENCOUNTER — Encounter: Payer: Self-pay | Admitting: Plastic Surgery

## 2022-01-22 ENCOUNTER — Ambulatory Visit (INDEPENDENT_AMBULATORY_CARE_PROVIDER_SITE_OTHER): Payer: 59 | Admitting: Plastic Surgery

## 2022-01-22 DIAGNOSIS — Z9889 Other specified postprocedural states: Secondary | ICD-10-CM

## 2022-01-22 DIAGNOSIS — N6489 Other specified disorders of breast: Secondary | ICD-10-CM | POA: Diagnosis not present

## 2022-01-22 NOTE — Progress Notes (Signed)
? ?  Subjective:  ? ? Patient ID: Ariel [name], female    DOB: 1961-10-05, 61 y.o.   MRN: [id number] ? ?The patient is a 61 yrs old female here for follow up on her breasts.  She underwent bilateral breast reductions Oct 2021.  She had wounds and challenges with healing.  She is now doing really well.  There is resulting breast asymmetry and scar contracture of the lower pole of the breast.  It is tight and has a pulling sensation with arm range of motion.   ? ? ? ? ?Review of Systems  ?Constitutional: Negative.   ?Eyes: Negative.   ?Respiratory: Negative.    ?Cardiovascular: Negative.   ?Endocrine: Negative.   ?Genitourinary: Negative.   ? ?   ?Objective:  ? Physical Exam ?Constitutional:   ?   Appearance: Normal appearance.  ?Cardiovascular:  ?   Rate and Rhythm: Normal rate.  ?   Pulses: Normal pulses.  ?Pulmonary:  ?   Effort: Pulmonary effort is normal.  ?Skin: ?   General: Skin is warm.  ?   Coloration: Skin is not jaundiced.  ?Neurological:  ?   Mental Status: She is alert and oriented to person, place, and time.  ?Psychiatric:     ?   Mood and Affect: Mood normal.     ?   Behavior: Behavior normal.     ?   Thought Content: Thought content normal.  ? ? ? ? ? ?   ?Assessment & Plan:  ? ?  ICD-10-CM   ?1. S/P bilateral breast reduction  Z98.890   ?  ?2. Postoperative breast asymmetry  N64.89   ?  ?  ?Plan for release of scar contracture bilateral breasts with possible lipofilling for improved symmetry. ? ? ?

## 2022-01-23 DIAGNOSIS — N6489 Other specified disorders of breast: Secondary | ICD-10-CM | POA: Insufficient documentation

## 2022-02-13 ENCOUNTER — Encounter: Payer: Self-pay | Admitting: Family Medicine

## 2022-02-13 ENCOUNTER — Ambulatory Visit (INDEPENDENT_AMBULATORY_CARE_PROVIDER_SITE_OTHER): Payer: 59 | Admitting: Family Medicine

## 2022-02-13 VITALS — BP 100/70 | HR 80 | Temp 98.3°F | Ht 64.0 in | Wt 167.4 lb

## 2022-02-13 DIAGNOSIS — E039 Hypothyroidism, unspecified: Secondary | ICD-10-CM

## 2022-02-13 DIAGNOSIS — I700 Atherosclerosis of aorta: Secondary | ICD-10-CM | POA: Diagnosis not present

## 2022-02-13 DIAGNOSIS — E785 Hyperlipidemia, unspecified: Secondary | ICD-10-CM | POA: Diagnosis not present

## 2022-02-13 DIAGNOSIS — L309 Dermatitis, unspecified: Secondary | ICD-10-CM

## 2022-02-13 DIAGNOSIS — Z0000 Encounter for general adult medical examination without abnormal findings: Secondary | ICD-10-CM | POA: Diagnosis not present

## 2022-02-13 DIAGNOSIS — I10 Essential (primary) hypertension: Secondary | ICD-10-CM | POA: Diagnosis not present

## 2022-02-13 DIAGNOSIS — F321 Major depressive disorder, single episode, moderate: Secondary | ICD-10-CM

## 2022-02-13 DIAGNOSIS — Z1211 Encounter for screening for malignant neoplasm of colon: Secondary | ICD-10-CM

## 2022-02-13 DIAGNOSIS — R739 Hyperglycemia, unspecified: Secondary | ICD-10-CM

## 2022-02-13 NOTE — Progress Notes (Signed)
Ariel [name] ?DOB: May 22, 1961 ?Encounter date: 02/13/2022 ? ?This is a 61 y.o. female who presents for complete physical  ? ?History of present illness/Additional concerns: ? ?Saw Dr. Baltazar Apo this month. Planning scar release, possible lipofilling to even breasts. ?Saw Dr. Dellis Filbert in December.normal pap. Started estrogen cream which has helped. She didn't complete bone density but it was ordered. Her last mammogram was 03/2020.  ? ?Cologuard 06/2019 was negative.  ? ?HTN: hctz '25mg'$ , losartan '25mg'$  - has been running well ? ?Hypothyroid: 26mg daily ? ?She feels like mood has been terrible - on zoloft '200mg'$ . Had outburst at work, couple of melt downs. Waking in morning in tears. She is in counseling. She is irritable, moody, depressed. Just feels like she is all over the place. She is able to sleep, but only with the trazodone. 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 Spending more time alone and in room by herself most of her time not working.  ? ? ? ?Past Medical History:  ?Diagnosis Date  ? Chronic bronchitis ([facility]   ? Dyspnea   ? Fibroids, intramural   ? Goiter   ? History of hypertension   ? Hyperlipidemia   ? Hypertension   ? Hypothyroidism   ? Open wound of right breast 10/03/2020  ? Osteopenia 01/2018  ? T score -1.3 FRAX 3.8% / 0.5%  ? Thyroid disease   ? Tobacco use 05/29/2017  ? ?Past Surgical History:  ?Procedure Laterality Date  ? ABDOMINAL HYSTERECTOMY    ? 07/2017-Dr [place] per patient; done for fibroids  ? BREAST REDUCTION SURGERY Bilateral 08/03/2020  ? Procedure: BREAST REDUCTION WITH LIPOSUCTION;  Surgeon: DWallace Going DO;  Location: [facility]  Service: Plastics;  Laterality: Bilateral;  3.5 hours  ? BUNIONECTOMY Right   ? CARDIAC ELECTROPHYSIOLOGY STUDY AND ABLATION  2010  ? hx tachycardia    ? LAPAROTOMY N/A 06/26/2017  ? Procedure: EXPLORATORY LAPAROTOMY;  Surgeon: REveritt Amber MD;  Location: [facility];  Service: Gynecology;  Laterality: N/A;  ? THYROID LOBECTOMY Left 06/18/2019  ? Procedure: LEFT THYROID LOBECTOMY;  Surgeon: GArmandina Gemma MD;  Location: [facility];  Service: General;  Laterality: Left;  ? TONSILLECTOMY  1967  ? TUBAL LIGATION    ? X2, REVERSAL   ? ?Allergies  ?Allergen Reactions  ? Wellbutrin [Bupropion]   ?  Made her sick  ? ?Current Meds  ?Medication Sig  ? albuterol (VENTOLIN HFA) 108 (90 Base) MCG/ACT inhaler Inhale 2 puffs into the lungs every 6 (six) hours as needed for wheezing or shortness of breath.  ? atorvastatin (LIPITOR) 20 MG tablet TAKE 1 TABLET BY MOUTH EVERY DAY  ? furosemide (LASIX) 20 MG tablet TAKE 1 TABLET BY MOUTH DAILY AS NEEDED FOR EDEMA  ? hydrochlorothiazide (HYDRODIURIL) 25 MG tablet TAKE 1 TABLET BY MOUTH EVERY DAY  ? levothyroxine (SYNTHROID) 50 MCG tablet TAKE 1 TABLET BY MOUTH EVERY DAY  ? losartan (COZAAR) 25 MG tablet TAKE 1 TABLET (25 MG TOTAL) BY MOUTH DAILY.  ? meloxicam (MOBIC) 15 MG tablet meloxicam 15 mg tablet ? Take 1 tablet daily with food.  ? NONFORMULARY OR COMPOUNDED ITEM Estradiol vaginal cream 06.38%1 applicator twice weekly  ? potassium chloride (KLOR-CON) 10 MEQ tablet TAKE 1 TABLET (10 MEQ TOTAL) BY MOUTH 3 (THREE) TIMES A WEEK.  ?  sertraline (ZOLOFT) 100 MG tablet TAKE 1 TABLET BY MOUTH EVERY DAY (Patient taking differently: 200 mg.)  ? Spacer/Aero-Holding Chambers DEVI 1 Device by Does not apply route daily as needed.  ? traZODone (DESYREL) 50 MG tablet Take 0.5-1 tablets (25-50 mg total) by mouth at bedtime as needed for sleep. for sleep  ? triamcinolone cream (KENALOG) 0.1 % Apply to affected area twice daily for 2 weeks time.  ? ?Social History  ? ?Tobacco Use  ? Smoking status: Former  ?  Packs/day: 0.25  ?  Years: 30.00  ?  Pack years: 7.50  ?  Types: Cigarettes  ?  Quit date: 06/17/2019  ?  Years since quitting: 2.6  ? Smokeless  tobacco: Never  ?Substance Use Topics  ? Alcohol use: Yes  ?  Comment: OCC  ? ?Family History  ?Problem Relation Age of Onset  ? Thyroid disease Maternal Aunt   ? Hypertension Mother   ? Renal Disease Mother   ? Diabetes Father   ? Hypertension Father   ? Renal Disease Father   ?     on dialysis  ? Stroke Maternal Grandmother   ? Breast cancer Maternal Grandmother   ?     diagnosed in her 4's  ? Cancer Paternal Grandmother   ?     ?  ? Healthy Son   ? Healthy Daughter   ? ? ? ?Review of Systems  ?Constitutional:  Positive for fatigue. Negative for activity change, appetite change, chills, fever and unexpected weight change.  ?HENT:  Negative for congestion, ear pain, hearing loss, sinus pressure, sinus pain, sore throat and trouble swallowing.   ?Eyes:  Negative for pain and visual disturbance.  ?Respiratory:  Negative for cough, chest tightness, shortness of breath and wheezing.   ?Cardiovascular:  Negative for chest pain, palpitations and leg swelling.  ?Gastrointestinal:  Negative for abdominal pain, blood in stool, constipation, diarrhea, nausea and vomiting.  ?Genitourinary:  Negative for difficulty urinating and menstrual problem.  ?Musculoskeletal:  Negative for arthralgias and back pain.  ?Skin:  Negative for rash.  ?Neurological:  Negative for dizziness, weakness, numbness and headaches.  ?Hematological:  Negative for adenopathy. Does not bruise/bleed easily.  ?Psychiatric/Behavioral:  Positive for agitation. Negative for sleep disturbance and suicidal ideas. The patient is not nervous/anxious.   ? ?CBC:  ?Lab Results  ?Component Value Date  ? WBC 7.0 08/15/2021  ? HGB 14.0 08/15/2021  ? HCT 42.1 08/15/2021  ? MCH 30.5 07/21/2020  ? MCHC 33.2 08/15/2021  ? RDW 13.5 08/15/2021  ? PLT 226.0 08/15/2021  ? MPV 11.2 07/21/2020  ? ?CMP: ?Lab Results  ?Component Value Date  ? NA 138 08/15/2021  ? K 3.6 08/15/2021  ? CL 99 08/15/2021  ? CO2 32 08/15/2021  ? ANIONGAP 11 06/15/2019  ? GLUCOSE 93 08/15/2021  ? BUN 14  08/15/2021  ? CREATININE 0.82 08/15/2021  ? CREATININE 0.77 07/21/2020  ? GFRAA >60 06/15/2019  ? CALCIUM 10.3 08/15/2021  ? PROT 8.3 08/15/2021  ? BILITOT 0.6 08/15/2021  ? ALKPHOS 131 (H) 08/15/2021  ? ALT 13 08/15/2021  ? AST 25 08/15/2021  ? ?LIPID: ?Lab Results  ?Component Value Date  ? CHOL 197 08/15/2021  ? TRIG 72.0 08/15/2021  ? HDL 61.40 08/15/2021  ? LDLCALC 121 (H) 08/15/2021  ? ? ?Objective: ? ?BP 100/70 (BP Location: Left Arm, Patient Position: Sitting, Cuff Size: Normal)   Pulse 80   Temp 98.3 ?F (36.8 ?C) (Oral)   Ht '5\' 4"'$  (  1.626 m)   Wt 167 lb 6.4 oz (75.9 kg)   LMP 06/29/2017   SpO2 98%   BMI 28.73 kg/m?   Weight: 167 lb 6.4 oz (75.9 kg)  ? ?BP Readings from Last 3 Encounters:  ?02/13/22 100/70  ?09/24/21 124/80  ?08/15/21 118/80  ? ?Wt Readings from Last 3 Encounters:  ?02/13/22 167 lb 6.4 oz (75.9 kg)  ?09/24/21 162 lb (73.5 kg)  ?08/15/21 158 lb 6.4 oz (71.8 kg)  ? ? ?Physical Exam ?Constitutional:   ?   General: She is not in acute distress. ?   Appearance: She is well-developed.  ?HENT:  ?   Head: Normocephalic and atraumatic.  ?   Right Ear: External ear normal.  ?   Left Ear: External ear normal.  ?   Mouth/Throat:  ?   Pharynx: No oropharyngeal exudate.  ?Eyes:  ?   Conjunctiva/sclera: Conjunctivae normal.  ?   Pupils: Pupils are equal, round, and reactive to light.  ?Neck:  ?   Thyroid: No thyromegaly.  ?Cardiovascular:  ?   Rate and Rhythm: Normal rate and regular rhythm.  ?   Heart sounds: Normal heart sounds. No murmur heard. ?  No friction rub. No gallop.  ?Pulmonary:  ?   Effort: Pulmonary effort is normal.  ?   Breath sounds: Normal breath sounds.  ?Abdominal:  ?   General: Bowel sounds are normal. There is no distension.  ?   Palpations: Abdomen is soft. There is no mass.  ?   Tenderness: There is no abdominal tenderness. There is no guarding.  ?   Hernia: No hernia is present.  ?Musculoskeletal:     ?   General: No tenderness or deformity. Normal range of motion.  ?    Cervical back: Normal range of motion and neck supple.  ?Lymphadenopathy:  ?   Cervical: No cervical adenopathy.  ?Skin: ?   General: Skin is warm and dry.  ?   Findings: No rash.  ?Neurological:  ?   Mental Status: Sh

## 2022-02-14 MED ORDER — TRIAMCINOLONE ACETONIDE 0.1 % EX CREA: TOPICAL_CREAM | CUTANEOUS | 2 refills | Status: DC

## 2022-02-15 ENCOUNTER — Other Ambulatory Visit: Payer: Self-pay | Admitting: Family Medicine

## 2022-02-15 ENCOUNTER — Encounter: Payer: Self-pay | Admitting: Family Medicine

## 2022-02-15 DIAGNOSIS — Z1231 Encounter for screening mammogram for malignant neoplasm of breast: Secondary | ICD-10-CM

## 2022-02-19 ENCOUNTER — Other Ambulatory Visit (INDEPENDENT_AMBULATORY_CARE_PROVIDER_SITE_OTHER): Payer: 59

## 2022-02-19 DIAGNOSIS — E785 Hyperlipidemia, unspecified: Secondary | ICD-10-CM | POA: Diagnosis not present

## 2022-02-19 DIAGNOSIS — E039 Hypothyroidism, unspecified: Secondary | ICD-10-CM | POA: Diagnosis not present

## 2022-02-19 DIAGNOSIS — R739 Hyperglycemia, unspecified: Secondary | ICD-10-CM | POA: Diagnosis not present

## 2022-02-19 DIAGNOSIS — I10 Essential (primary) hypertension: Secondary | ICD-10-CM

## 2022-02-19 LAB — CBC WITH DIFFERENTIAL/PLATELET
Basophils Absolute: 0 10*3/uL (ref 0.0–0.1)
Basophils Relative: 0.5 % (ref 0.0–3.0)
Eosinophils Absolute: 0.2 10*3/uL (ref 0.0–0.7)
Eosinophils Relative: 3.5 % (ref 0.0–5.0)
HCT: 39.6 % (ref 36.0–46.0)
Hemoglobin: 13.1 g/dL (ref 12.0–15.0)
Lymphocytes Relative: 40.3 % (ref 12.0–46.0)
Lymphs Abs: 2.2 10*3/uL (ref 0.7–4.0)
MCHC: 33.1 g/dL (ref 30.0–36.0)
MCV: 90.2 fl (ref 78.0–100.0)
Monocytes Absolute: 0.4 10*3/uL (ref 0.1–1.0)
Monocytes Relative: 7 % (ref 3.0–12.0)
Neutro Abs: 2.6 10*3/uL (ref 1.4–7.7)
Neutrophils Relative %: 48.7 % (ref 43.0–77.0)
Platelets: 227 10*3/uL (ref 150.0–400.0)
RBC: 4.4 Mil/uL (ref 3.87–5.11)
RDW: 13.5 % (ref 11.5–15.5)
WBC: 5.4 10*3/uL (ref 4.0–10.5)

## 2022-02-19 LAB — LIPID PANEL
Cholesterol: 190 mg/dL (ref 0–200)
HDL: 51.3 mg/dL (ref >39.00)
LDL Cholesterol: 116 mg/dL — ABNORMAL HIGH (ref 0–99)
NonHDL: 138.4
Total CHOL/HDL Ratio: 4
Triglycerides: 114 mg/dL (ref 0.0–149.0)
VLDL: 22.8 mg/dL (ref 0.0–40.0)

## 2022-02-19 LAB — COMPREHENSIVE METABOLIC PANEL
ALT: 14 U/L (ref 0–35)
AST: 26 U/L (ref 0–37)
Albumin: 4.4 g/dL (ref 3.5–5.2)
Alkaline Phosphatase: 114 U/L (ref 39–117)
BUN: 15 mg/dL (ref 6–23)
CO2: 31 mEq/L (ref 19–32)
Calcium: 10.1 mg/dL (ref 8.4–10.5)
Chloride: 101 mEq/L (ref 96–112)
Creatinine, Ser: 1.27 mg/dL — ABNORMAL HIGH (ref 0.40–1.20)
GFR: 46.42 mL/min — ABNORMAL LOW (ref >60.00)
Glucose, Bld: 96 mg/dL (ref 70–99)
Potassium: 3.7 mEq/L (ref 3.5–5.1)
Sodium: 138 mEq/L (ref 135–145)
Total Bilirubin: 0.4 mg/dL (ref 0.2–1.2)
Total Protein: 7.5 g/dL (ref 6.0–8.3)

## 2022-02-19 LAB — TSH: TSH: 2.14 u[IU]/mL (ref 0.35–5.50)

## 2022-02-19 LAB — HEMOGLOBIN A1C: Hgb A1c MFr Bld: 5.8 % (ref 4.6–6.5)

## 2022-02-20 ENCOUNTER — Telehealth: Payer: Self-pay | Admitting: *Deleted

## 2022-02-20 NOTE — Telephone Encounter (Signed)
Patient dropped off forms for Ariel [name] to be completed.   ?

## 2022-02-22 ENCOUNTER — Encounter: Payer: Self-pay | Admitting: Family Medicine

## 2022-02-26 ENCOUNTER — Other Ambulatory Visit: Payer: Self-pay | Admitting: Family Medicine

## 2022-02-26 ENCOUNTER — Ambulatory Visit (INDEPENDENT_AMBULATORY_CARE_PROVIDER_SITE_OTHER): Payer: 59 | Admitting: Plastic Surgery

## 2022-02-26 DIAGNOSIS — L905 Scar conditions and fibrosis of skin: Secondary | ICD-10-CM | POA: Diagnosis not present

## 2022-02-26 DIAGNOSIS — Z9889 Other specified postprocedural states: Secondary | ICD-10-CM | POA: Diagnosis not present

## 2022-02-26 DIAGNOSIS — N62 Hypertrophy of breast: Secondary | ICD-10-CM

## 2022-02-26 NOTE — Progress Notes (Addendum)
   Subjective:    Patient ID: Ariel [name], female    DOB: [birth year], 61 y.o.   MRN: [id number]  The patient is a 61 year old female joining me by phone.  She underwent a breast reduction on October 2021.  She had over 1300 g removed from her breasts.  She had some trouble healing particularly on the right side.  She now has a contracture of the right nipple and inframammary fold.  There is some tightness and discomfort due to the contracture.it is also difficult to keep the nipple clean because of the grooves and pockets.  She would like to see if this could be released.  She is otherwise doing well and not having any other issues.       Review of Systems  Constitutional: Negative.   HENT: Negative.    Eyes: Negative.   Respiratory: Negative.    Cardiovascular: Negative.   Gastrointestinal: Negative.   Endocrine: Negative.   Genitourinary: Negative.   Musculoskeletal: Negative.       Objective:   Physical Exam     Assessment & Plan:     ICD-10-CM   1. 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 S/P bilateral breast reduction  Z98.890     3. Scar contracture  L90.5     I think the patient would benefit from scar contracture release of the right breast at the inframammary fold and nipple area with fat filling so that the contracture does not recur.  I connected with  Ariel [name] on 02/26/22 by phone and verified that I am speaking with the correct person using two identifiers.  I spent 10 minutes in the following manner: Review of chart, review of pictures, documentation and (5 min in discussion with patient).   I discussed the limitations of evaluation and management by telemedicine. The patient expressed understanding and agreed to proceed.

## 2022-03-01 ENCOUNTER — Other Ambulatory Visit: Payer: 59

## 2022-03-01 ENCOUNTER — Other Ambulatory Visit: Payer: Self-pay

## 2022-03-01 DIAGNOSIS — R739 Hyperglycemia, unspecified: Secondary | ICD-10-CM

## 2022-03-01 DIAGNOSIS — I10 Essential (primary) hypertension: Secondary | ICD-10-CM

## 2022-03-02 LAB — BASIC METABOLIC PANEL
BUN: 14 mg/dL (ref 7–25)
CO2: 29 mmol/L (ref 20–32)
Calcium: 9.8 mg/dL (ref 8.6–10.4)
Chloride: 102 mmol/L (ref 98–110)
Creat: 1.02 mg/dL (ref 0.50–1.03)
Glucose, Bld: 100 mg/dL — ABNORMAL HIGH (ref 65–99)
Potassium: 3.9 mmol/L (ref 3.5–5.3)
Sodium: 139 mmol/L (ref 135–146)

## 2022-03-04 ENCOUNTER — Encounter: Payer: Self-pay | Admitting: Family Medicine

## 2022-03-04 NOTE — Telephone Encounter (Signed)
Spoke with the patient and informed her the original was left at the front desk for pick up and a copy was sent to be scanned. ?

## 2022-03-04 NOTE — Progress Notes (Signed)
Mychart message sent.

## 2022-03-04 NOTE — Telephone Encounter (Signed)
Paperwork completed and returned to you. She needs to sign in marked spot and office stamp applied. ?

## 2022-03-05 ENCOUNTER — Other Ambulatory Visit: Payer: 59

## 2022-03-06 MED ORDER — DESVENLAFAXINE SUCCINATE ER 50 MG PO TB24: 50.0000 mg | ORAL_TABLET | Freq: Every day | ORAL | 3 refills | Status: DC

## 2022-03-11 ENCOUNTER — Telehealth: Payer: Self-pay | Admitting: Family Medicine

## 2022-03-11 NOTE — Telephone Encounter (Signed)
Pt call and stated she want the paper work that dr. Harden Mo fill out for her mail to her home.

## 2022-03-12 NOTE — Telephone Encounter (Signed)
Paperwork from the front desk was mailed to the patient's home address.

## 2022-03-15 ENCOUNTER — Other Ambulatory Visit: Payer: Self-pay | Admitting: Family Medicine

## 2022-03-15 DIAGNOSIS — I10 Essential (primary) hypertension: Secondary | ICD-10-CM

## 2022-03-27 NOTE — Progress Notes (Signed)
Patient ID: Ariel [name], female    DOB: 22-Jan-1961, 61 y.o.   MRN: [id number]  Chief Complaint  Patient presents with   Pre-op Exam   Postoperative Breat Asymmetry    History of Present Illness: Ariel [name] is a 61 y.o.  female  with a history of symptomatic mammary hypertrophy who underwent bilateral breast reduction with Dr. Marla Roe.  She presents for preoperative evaluation for upcoming procedure, scar contracture release of the right breast with fat grafting, scheduled for 04/03/2022 with Dr. Marla Roe.  The patient has not had problems with anesthesia.  Patient denies any personal history of breast cancer.  She denies any cardiac disease or taking any blood thinners.  Patient denies any history of DVT or clots.  She denies any recent incidences including surgery, trauma, infection, or hospitalizations.  She denies MI/CVA.  Patient denies bowel disease, COPD, or asthma.  Patient reports she sometimes has episodes of shortness of breath which she has seen her primary care provider for.  She states that they worked this up and did not find any associating diagnosis with it.  She reports she was given albuterol by her PCP for this.  Summary of Previous Visit: Patient was last seen in the clinic by Dr. Marla Roe on 01/22/2022 for follow-up.  At this visit, patient wanted to discuss her breast asymmetry and scar contracture on the right breast.  Planned for release of scar contracture with possible lipo filling for improved symmetry.  Job: Works at [employer], no strenuous activity involved; will plan for 1 week off   PMH Significant for: Hypertension, atherosclerosis, thyroid nodule, arthritis  Patient also inquired if excess skin at the lateral aspects of previous incision sites could be addressed.    Past Medical History: Allergies: Allergies  Allergen Reactions   Wellbutrin [Bupropion] Other (See Comments)    Made her sick    Current Medications:  Current Outpatient  Medications:    albuterol (VENTOLIN HFA) 108 (90 Base) MCG/ACT inhaler, TAKE 2 PUFFS BY MOUTH EVERY 6 HOURS AS NEEDED FOR WHEEZE OR SHORTNESS OF BREATH, Disp: 6.7 each, Rfl: 2   atorvastatin (LIPITOR) 20 MG tablet, TAKE 1 TABLET BY MOUTH EVERY DAY, Disp: 90 tablet, Rfl: 1   desvenlafaxine (PRISTIQ) 50 MG 24 hr tablet, Take 1 tablet (50 mg total) by mouth daily., Disp: 90 tablet, Rfl: 3   furosemide (LASIX) 20 MG tablet, TAKE 1 TABLET BY MOUTH DAILY AS NEEDED FOR EDEMA, Disp: 90 tablet, Rfl: 1   hydrochlorothiazide (HYDRODIURIL) 25 MG tablet, TAKE 1 TABLET BY MOUTH EVERY DAY, Disp: 90 tablet, Rfl: 1   levothyroxine (SYNTHROID) 50 MCG tablet, TAKE 1 TABLET BY MOUTH EVERY DAY, Disp: 90 tablet, Rfl: 1   losartan (COZAAR) 25 MG tablet, TAKE 1 TABLET (25 MG TOTAL) BY MOUTH DAILY., Disp: 90 tablet, Rfl: 1   meloxicam (MOBIC) 15 MG tablet, Take 15 mg by mouth daily., Disp: , Rfl:    NONFORMULARY OR COMPOUNDED ITEM, Estradiol vaginal cream 4.40% 1 applicator twice weekly (Patient taking differently: Place 1 application. vaginally 2 (two) times a week. Estradiol vaginal cream 1.02% 1 applicator twice weekly), Disp: 90 each, Rfl: 3   potassium chloride (KLOR-CON) 10 MEQ tablet, TAKE 1 TABLET (10 MEQ TOTAL) BY MOUTH 3 (THREE) TIMES A WEEK., Disp: 36 tablet, Rfl: 1   Spacer/Aero-Holding Chambers DEVI, 1 Device by Does not apply route daily as needed., Disp: 1 each, Rfl: 0   triamcinolone cream (KENALOG) 0.1 %, Apply to affected  area twice daily for 2 weeks time., Disp: 30 g, Rfl: 2  Past Medical Problems: Past Medical History:  Diagnosis Date   Chronic bronchitis ([place])    Dyspnea    Fibroids, intramural    Goiter    History of hypertension    Hyperlipidemia    Hypertension    Hypothyroidism    Open wound of right breast 10/03/2020   Osteopenia 01/2018   T score -1.3 FRAX 3.8% / 0.5%   Thyroid disease    Tobacco use 05/29/2017    Past Surgical History: Past Surgical History:  Procedure Laterality  Date   ABDOMINAL HYSTERECTOMY     07/2017-Dr [place] per patient; done for fibroids   BREAST REDUCTION SURGERY Bilateral 08/03/2020   Procedure: BREAST REDUCTION WITH LIPOSUCTION;  Surgeon: Wallace Going, DO;  Location: [facility];  Service: Plastics;  Laterality: Bilateral;  3.5 hours   BUNIONECTOMY Right    CARDIAC ELECTROPHYSIOLOGY STUDY AND ABLATION  2010   hx tachycardia    LAPAROTOMY N/A 06/26/2017   Procedure: EXPLORATORY LAPAROTOMY;  Surgeon: Everitt Amber, MD;  Location: [facility];  Service: Gynecology;  Laterality: N/A;   THYROID LOBECTOMY Left 06/18/2019   Procedure: LEFT THYROID LOBECTOMY;  Surgeon: Armandina Gemma, MD;  Location: [facility];  Service: General;  Laterality: Left;   TONSILLECTOMY  1967   TUBAL LIGATION     X2, REVERSAL     Social History: Social History   Socioeconomic History   Marital status: Single    Spouse name: Not on file   Number of children: Not on file   Years of education: Not on file   Highest education level: Not on file  Occupational History   Not on file  Tobacco Use   Smoking status: Former    Packs/day: 0.25    Years: 30.00    Total pack years: 7.50    Types: Cigarettes    Quit date: 06/17/2019    Years since quitting: 2.7   Smokeless tobacco: Never  Vaping Use   Vaping Use: Never used  Substance and Sexual Activity   Alcohol use: Yes    Comment: OCC   Drug use: Yes    Types: Marijuana    Comment: Occas   Sexual activity: Yes    Partners: Male    Comment: 1ST INTERCOURSE- 49, PARTNERS- 40, CURRENT PARTNER- 3.5 YRS   Other Topics Concern   Not on file  Social History Narrative   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 Christian, no church currenty      Lifestyle: "poor diet"; no exercise   Social Determinants of Radio broadcast assistant Strain: Not on file  Food Insecurity: Not on file  Transportation Needs: Not on file  Physical  Activity: Not on file  Stress: Not on file  Social Connections: Not on file  Intimate Partner Violence: Not on file    Family History: Family History  Problem Relation Age of Onset   Thyroid disease Maternal Aunt    Hypertension Mother    Renal Disease Mother    Diabetes Father    Hypertension Father    Renal Disease Father        on dialysis   Stroke Maternal Grandmother    Breast cancer Maternal Grandmother        diagnosed in her 79's   Cancer Paternal Grandmother        ?  Healthy Son    Healthy Daughter     Review of Systems: Episodes of SOB which was worked up and is being managed by PCP No other changes in interim health   Physical Exam: Vital Signs BP (!) 152/97 (BP Location: Left Arm, Patient Position: Sitting, Cuff Size: Normal)   Pulse 74   Ht '5\' 3"'$  (1.6 m)   Wt 165 lb 3.2 oz (74.9 kg)   LMP 06/29/2017   SpO2 96%   BMI 29.26 kg/m   Physical Exam  Constitutional:      General: Not in acute distress.    Appearance: Normal appearance. Not ill-appearing.  HENT:     Head: Normocephalic and atraumatic.  Eyes:     Pupils: Pupils are equal, round Neck:     Musculoskeletal: Normal range of motion.  Cardiovascular:     Rate and Rhythm: Normal rate Pulmonary:     Effort: Pulmonary effort is normal. No respiratory distress.  Abdominal:     General: Abdomen is flat.  Musculoskeletal: Normal range of motion.  Skin:    General: Skin is warm and dry.     Findings: No erythema or rash.  Neurological:     Mental Status: Alert and oriented to person, place, and time. Mental status is at baseline.  Psychiatric:        Mood and Affect: Mood normal.        Behavior: Behavior normal.    Assessment/Plan: The patient is scheduled for right breast scar contracture release with Dr. Marla Roe.  Risks, benefits, and alternatives of procedure discussed, questions answered and consent obtained.    Discussed the patient's inquiry about the excess skin at the lateral  edges of the previous incision sites with Dr. Marla Roe. Per Dr. Marla Roe, she will be able to revise these while in the OR. I discussed this with the patient. Patient acknowledged and agreed with the plan.   Smoking Status: Patient is a non-smoker; Counseling Given? N/A  Mammogram: Patient's next mammogram to be on 04/02/2022. Discussed with patient to notify us if there are any abnormalities while at her mammogram appointment. Patient acknowledged.   Caprini Score: 4; Risk Factors include: Age, BMI > 25, and length of planned surgery. Recommendation for mechanical prophylaxis. Encourage early ambulation.   Pictures obtained: 03/28/22, 11/09/21  Post-op Rx sent to pharmacy: Oxycodone, Zofran, Keflex  Patient was provided with the General Surgical Risk consent document and Pain Medication Agreement prior to their appointment.  They had adequate time to read through the risk consent documents and Pain Medication Agreement. We also discussed them in person together during this preop appointment. All of their questions were answered to their satisfaction.  Recommended calling if they have any further questions.  Risk consent form and Pain Medication Agreement to be scanned into patient's chart.  Discussed with patient to hold estradiol cream and Mobic 1 week prior to surgery.  Patient acknowledged.   Electronically signed by: Clance Boll, PA-C 03/28/2022 2:59 PM

## 2022-03-27 NOTE — H&P (View-Only) (Signed)
Patient ID: Ariel [name], female    DOB: 04/02/1961, 61 y.o.   MRN: [id number]  Chief Complaint  Patient presents with   Pre-op Exam   Postoperative Breat Asymmetry    History of Present Illness: Ariel [name] is a 61 y.o.  female  with a history of symptomatic mammary hypertrophy who underwent bilateral breast reduction with Dr. Marla Roe.  She presents for preoperative evaluation for upcoming procedure, scar contracture release of the right breast with fat grafting, scheduled for 04/03/2022 with Dr. Marla Roe.  The patient has not had problems with anesthesia.  Patient denies any personal history of breast cancer.  She denies any cardiac disease or taking any blood thinners.  Patient denies any history of DVT or clots.  She denies any recent incidences including surgery, trauma, infection, or hospitalizations.  She denies MI/CVA.  Patient denies bowel disease, COPD, or asthma.  Patient reports she sometimes has episodes of shortness of breath which she has seen her primary care provider for.  She states that they worked this up and did not find any associating diagnosis with it.  She reports she was given albuterol by her PCP for this.  Summary of Previous Visit: Patient was last seen in the clinic by Dr. Marla Roe on 01/22/2022 for follow-up.  At this visit, patient wanted to discuss her breast asymmetry and scar contracture on the right breast.  Planned for release of scar contracture with possible lipo filling for improved symmetry.  Job: Works at [employer], no strenuous activity involved; will plan for 1 week off   PMH Significant for: Hypertension, atherosclerosis, thyroid nodule, arthritis  Patient also inquired if excess skin at the lateral aspects of previous incision sites could be addressed.    Past Medical History: Allergies: Allergies  Allergen Reactions   Wellbutrin [Bupropion] Other (See Comments)    Made her sick    Current Medications:  Current Outpatient  Medications:    albuterol (VENTOLIN HFA) 108 (90 Base) MCG/ACT inhaler, TAKE 2 PUFFS BY MOUTH EVERY 6 HOURS AS NEEDED FOR WHEEZE OR SHORTNESS OF BREATH, Disp: 6.7 each, Rfl: 2   atorvastatin (LIPITOR) 20 MG tablet, TAKE 1 TABLET BY MOUTH EVERY DAY, Disp: 90 tablet, Rfl: 1   desvenlafaxine (PRISTIQ) 50 MG 24 hr tablet, Take 1 tablet (50 mg total) by mouth daily., Disp: 90 tablet, Rfl: 3   furosemide (LASIX) 20 MG tablet, TAKE 1 TABLET BY MOUTH DAILY AS NEEDED FOR EDEMA, Disp: 90 tablet, Rfl: 1   hydrochlorothiazide (HYDRODIURIL) 25 MG tablet, TAKE 1 TABLET BY MOUTH EVERY DAY, Disp: 90 tablet, Rfl: 1   levothyroxine (SYNTHROID) 50 MCG tablet, TAKE 1 TABLET BY MOUTH EVERY DAY, Disp: 90 tablet, Rfl: 1   losartan (COZAAR) 25 MG tablet, TAKE 1 TABLET (25 MG TOTAL) BY MOUTH DAILY., Disp: 90 tablet, Rfl: 1   meloxicam (MOBIC) 15 MG tablet, Take 15 mg by mouth daily., Disp: , Rfl:    NONFORMULARY OR COMPOUNDED ITEM, Estradiol vaginal cream 2.77% 1 applicator twice weekly (Patient taking differently: Place 1 application. vaginally 2 (two) times a week. Estradiol vaginal cream 8.24% 1 applicator twice weekly), Disp: 90 each, Rfl: 3   potassium chloride (KLOR-CON) 10 MEQ tablet, TAKE 1 TABLET (10 MEQ TOTAL) BY MOUTH 3 (THREE) TIMES A WEEK., Disp: 36 tablet, Rfl: 1   Spacer/Aero-Holding Chambers DEVI, 1 Device by Does not apply route daily as needed., Disp: 1 each, Rfl: 0   triamcinolone cream (KENALOG) 0.1 %, Apply to affected  area twice daily for 2 weeks time., Disp: 30 g, Rfl: 2  Past Medical Problems: Past Medical History:  Diagnosis Date   Chronic bronchitis ([place])    Dyspnea    Fibroids, intramural    Goiter    History of hypertension    Hyperlipidemia    Hypertension    Hypothyroidism    Open wound of right breast 10/03/2020   Osteopenia 01/2018   T score -1.3 FRAX 3.8% / 0.5%   Thyroid disease    Tobacco use 05/29/2017    Past Surgical History: Past Surgical History:  Procedure Laterality  Date   ABDOMINAL HYSTERECTOMY     07/2017-Dr [place] per patient; done for fibroids   BREAST REDUCTION SURGERY Bilateral 08/03/2020   Procedure: BREAST REDUCTION WITH LIPOSUCTION;  Surgeon: Wallace Going, DO;  Location: [facility];  Service: Plastics;  Laterality: Bilateral;  3.5 hours   BUNIONECTOMY Right    CARDIAC ELECTROPHYSIOLOGY STUDY AND ABLATION  2010   hx tachycardia    LAPAROTOMY N/A 06/26/2017   Procedure: EXPLORATORY LAPAROTOMY;  Surgeon: Everitt Amber, MD;  Location: [facility];  Service: Gynecology;  Laterality: N/A;   THYROID LOBECTOMY Left 06/18/2019   Procedure: LEFT THYROID LOBECTOMY;  Surgeon: Armandina Gemma, MD;  Location: [facility];  Service: General;  Laterality: Left;   TONSILLECTOMY  1967   TUBAL LIGATION     X2, REVERSAL     Social History: Social History   Socioeconomic History   Marital status: Single    Spouse name: Not on file   Number of children: Not on file   Years of education: Not on file   Highest education level: Not on file  Occupational History   Not on file  Tobacco Use   Smoking status: Former    Packs/day: 0.25    Years: 30.00    Total pack years: 7.50    Types: Cigarettes    Quit date: 06/17/2019    Years since quitting: 2.7   Smokeless tobacco: Never  Vaping Use   Vaping Use: Never used  Substance and Sexual Activity   Alcohol use: Yes    Comment: OCC   Drug use: Yes    Types: Marijuana    Comment: Occas   Sexual activity: Yes    Partners: Male    Comment: 1ST INTERCOURSE- 41, PARTNERS- 39, CURRENT PARTNER- 3.5 YRS   Other Topics Concern   Not on file  Social History Narrative   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 Christian, no church currenty      Lifestyle: "poor diet"; no exercise   Social Determinants of Radio broadcast assistant Strain: Not on file  Food Insecurity: Not on file  Transportation Needs: Not on file  Physical  Activity: Not on file  Stress: Not on file  Social Connections: Not on file  Intimate Partner Violence: Not on file    Family History: Family History  Problem Relation Age of Onset   Thyroid disease Maternal Aunt    Hypertension Mother    Renal Disease Mother    Diabetes Father    Hypertension Father    Renal Disease Father        on dialysis   Stroke Maternal Grandmother    Breast cancer Maternal Grandmother        diagnosed in her 75's   Cancer Paternal Grandmother        ?  Healthy Son    Healthy Daughter     Review of Systems: Episodes of SOB which was worked up and is being managed by PCP No other changes in interim health   Physical Exam: Vital Signs BP (!) 152/97 (BP Location: Left Arm, Patient Position: Sitting, Cuff Size: Normal)   Pulse 74   Ht '5\' 3"'$  (1.6 m)   Wt 165 lb 3.2 oz (74.9 kg)   LMP 06/29/2017   SpO2 96%   BMI 29.26 kg/m   Physical Exam  Constitutional:      General: Not in acute distress.    Appearance: Normal appearance. Not ill-appearing.  HENT:     Head: Normocephalic and atraumatic.  Eyes:     Pupils: Pupils are equal, round Neck:     Musculoskeletal: Normal range of motion.  Cardiovascular:     Rate and Rhythm: Normal rate Pulmonary:     Effort: Pulmonary effort is normal. No respiratory distress.  Abdominal:     General: Abdomen is flat.  Musculoskeletal: Normal range of motion.  Skin:    General: Skin is warm and dry.     Findings: No erythema or rash.  Neurological:     Mental Status: Alert and oriented to person, place, and time. Mental status is at baseline.  Psychiatric:        Mood and Affect: Mood normal.        Behavior: Behavior normal.    Assessment/Plan: The patient is scheduled for right breast scar contracture release with Dr. Marla Roe.  Risks, benefits, and alternatives of procedure discussed, questions answered and consent obtained.    Discussed the patient's inquiry about the excess skin at the lateral  edges of the previous incision sites with Dr. Marla Roe. Per Dr. Marla Roe, she will be able to revise these while in the OR. I discussed this with the patient. Patient acknowledged and agreed with the plan.   Smoking Status: Patient is a non-smoker; Counseling Given? N/A  Mammogram: Patient's next mammogram to be on 04/02/2022. Discussed with patient to notify us if there are any abnormalities while at her mammogram appointment. Patient acknowledged.   Caprini Score: 4; Risk Factors include: Age, BMI > 25, and length of planned surgery. Recommendation for mechanical prophylaxis. Encourage early ambulation.   Pictures obtained: 03/28/22, 11/09/21  Post-op Rx sent to pharmacy: Oxycodone, Zofran, Keflex  Patient was provided with the General Surgical Risk consent document and Pain Medication Agreement prior to their appointment.  They had adequate time to read through the risk consent documents and Pain Medication Agreement. We also discussed them in person together during this preop appointment. All of their questions were answered to their satisfaction.  Recommended calling if they have any further questions.  Risk consent form and Pain Medication Agreement to be scanned into patient's chart.  Discussed with patient to hold estradiol cream and Mobic 1 week prior to surgery.  Patient acknowledged.   Electronically signed by: Clance Boll, PA-C 03/28/2022 2:59 PM

## 2022-03-28 ENCOUNTER — Ambulatory Visit (INDEPENDENT_AMBULATORY_CARE_PROVIDER_SITE_OTHER): Payer: 59 | Admitting: Student

## 2022-03-28 ENCOUNTER — Encounter: Payer: Self-pay | Admitting: Student

## 2022-03-28 VITALS — BP 152/97 | HR 74 | Ht 63.0 in | Wt 165.2 lb

## 2022-03-28 DIAGNOSIS — N6489 Other specified disorders of breast: Secondary | ICD-10-CM

## 2022-03-28 MED ORDER — ONDANSETRON HCL 4 MG PO TABS: 4.0000 mg | ORAL_TABLET | Freq: Three times a day (TID) | ORAL | 0 refills | Status: AC | PRN

## 2022-03-28 MED ORDER — CEPHALEXIN 500 MG PO CAPS: 500.0000 mg | ORAL_CAPSULE | Freq: Four times a day (QID) | ORAL | 0 refills | Status: AC

## 2022-03-28 MED ORDER — OXYCODONE HCL 5 MG PO TABS: 5.0000 mg | ORAL_TABLET | Freq: Four times a day (QID) | ORAL | 0 refills | Status: AC | PRN

## 2022-04-01 ENCOUNTER — Encounter (HOSPITAL_COMMUNITY): Payer: Self-pay | Admitting: Plastic Surgery

## 2022-04-01 NOTE — Progress Notes (Signed)
PCP - Dr Micheline Rough  Cardiologist - n/a Endocrinology - Dr Renato Shin  Chest x-ray - n/a EKG - 07/31/20 Stress Test - n/a ECHO - n/a Cardiac Cath - n/a  ICD Pacemaker/Loop - n/a  Sleep Study -  n/a CPAP - none  STOP now taking any Aspirin (unless otherwise instructed by your surgeon), Aleve, Naproxen, Ibuprofen, Motrin, Advil, Goody's, BC's, all herbal medications, fish oil, and all vitamins.   Coronavirus Screening Do you have any of the following symptoms:  Cough yes/no: No Fever (>100.68F)  yes/no: No Runny nose yes/no: No Sore throat yes/no: No Difficulty breathing/shortness of breath  yes/no: No  Have you traveled in the last 14 days and where? yes/no: No  Patient verbalized understanding of instructions that were given via phone.

## 2022-04-02 ENCOUNTER — Ambulatory Visit: Payer: 59

## 2022-04-03 ENCOUNTER — Encounter (HOSPITAL_COMMUNITY)
Admission: RE | Disposition: A | Discharge status: Home / Self Care | Payer: Self-pay | Source: Home / Self Care | Attending: Plastic Surgery

## 2022-04-03 ENCOUNTER — Ambulatory Visit (HOSPITAL_COMMUNITY)
Admission: RE | Admit: 2022-04-03 | Discharge: 2022-04-03 | Disposition: A | Discharge status: Home / Self Care | Payer: 59 | Attending: Plastic Surgery | Admitting: Plastic Surgery

## 2022-04-03 ENCOUNTER — Other Ambulatory Visit: Payer: Self-pay

## 2022-04-03 ENCOUNTER — Ambulatory Visit (HOSPITAL_COMMUNITY): Payer: 59 | Admitting: Certified Registered"

## 2022-04-03 ENCOUNTER — Encounter (HOSPITAL_COMMUNITY): Payer: Self-pay | Admitting: Plastic Surgery

## 2022-04-03 ENCOUNTER — Ambulatory Visit (HOSPITAL_BASED_OUTPATIENT_CLINIC_OR_DEPARTMENT_OTHER): Payer: 59 | Admitting: Certified Registered"

## 2022-04-03 DIAGNOSIS — N651 Disproportion of reconstructed breast: Secondary | ICD-10-CM | POA: Diagnosis not present

## 2022-04-03 DIAGNOSIS — T8544XA Capsular contracture of breast implant, initial encounter: Secondary | ICD-10-CM | POA: Diagnosis not present

## 2022-04-03 DIAGNOSIS — M1990 Unspecified osteoarthritis, unspecified site: Secondary | ICD-10-CM | POA: Diagnosis not present

## 2022-04-03 DIAGNOSIS — F419 Anxiety disorder, unspecified: Secondary | ICD-10-CM | POA: Insufficient documentation

## 2022-04-03 DIAGNOSIS — L987 Excessive and redundant skin and subcutaneous tissue: Secondary | ICD-10-CM | POA: Insufficient documentation

## 2022-04-03 DIAGNOSIS — Z9889 Other specified postprocedural states: Secondary | ICD-10-CM | POA: Insufficient documentation

## 2022-04-03 DIAGNOSIS — E039 Hypothyroidism, unspecified: Secondary | ICD-10-CM | POA: Diagnosis not present

## 2022-04-03 DIAGNOSIS — Z79899 Other long term (current) drug therapy: Secondary | ICD-10-CM | POA: Insufficient documentation

## 2022-04-03 DIAGNOSIS — Z87891 Personal history of nicotine dependence: Secondary | ICD-10-CM | POA: Insufficient documentation

## 2022-04-03 DIAGNOSIS — I10 Essential (primary) hypertension: Secondary | ICD-10-CM | POA: Diagnosis not present

## 2022-04-03 DIAGNOSIS — L905 Scar conditions and fibrosis of skin: Secondary | ICD-10-CM | POA: Insufficient documentation

## 2022-04-03 HISTORY — DX: Depression, unspecified: F32.A

## 2022-04-03 HISTORY — PX: LESION EXCISION WITH COMPLEX REPAIR: SHX6700

## 2022-04-03 HISTORY — PX: LIPOSUCTION WITH LIPOFILLING: SHX6436

## 2022-04-03 HISTORY — DX: Anxiety disorder, unspecified: F41.9

## 2022-04-03 LAB — CBC
HCT: 42.7 % (ref 36.0–46.0)
Hemoglobin: 14.3 g/dL (ref 12.0–15.0)
MCH: 30.2 pg (ref 26.0–34.0)
MCHC: 33.5 g/dL (ref 30.0–36.0)
MCV: 90.1 fL (ref 80.0–100.0)
Platelets: 242 10*3/uL (ref 150–400)
RBC: 4.74 MIL/uL (ref 3.87–5.11)
RDW: 13 % (ref 11.5–15.5)
WBC: 4.6 10*3/uL (ref 4.0–10.5)
nRBC: 0 % (ref 0.0–0.2)

## 2022-04-03 LAB — BASIC METABOLIC PANEL WITH GFR
Anion gap: 10 (ref 5–15)
BUN: 12 mg/dL (ref 6–20)
CO2: 28 mmol/L (ref 22–32)
Calcium: 9.6 mg/dL (ref 8.9–10.3)
Chloride: 101 mmol/L (ref 98–111)
Creatinine, Ser: 0.88 mg/dL (ref 0.44–1.00)
GFR, Estimated: >60 mL/min
Glucose, Bld: 107 mg/dL — ABNORMAL HIGH (ref 70–99)
Potassium: 2.8 mmol/L — ABNORMAL LOW (ref 3.5–5.1)
Sodium: 139 mmol/L (ref 135–145)

## 2022-04-03 SURGERY — LESION EXCISION WITH COMPLEX REPAIR
Anesthesia: General | Site: Breast | Laterality: Right

## 2022-04-03 MED ORDER — PHENYLEPHRINE 80 MCG/ML (10ML) SYRINGE FOR IV PUSH (FOR BLOOD PRESSURE SUPPORT)
PREFILLED_SYRINGE | INTRAVENOUS | Status: DC | PRN
  Administered 2022-04-03 (×5): 160 ug via INTRAVENOUS

## 2022-04-03 MED ORDER — OXYCODONE HCL 5 MG/5ML PO SOLN: 5.0000 mg | Freq: Once | ORAL | Status: DC | PRN

## 2022-04-03 MED ORDER — FENTANYL CITRATE (PF) 100 MCG/2ML IJ SOLN: 25.0000 ug | INTRAMUSCULAR | Status: DC | PRN

## 2022-04-03 MED ORDER — MEPERIDINE HCL 25 MG/ML IJ SOLN: 6.2500 mg | INTRAMUSCULAR | Status: DC | PRN

## 2022-04-03 MED ORDER — ONDANSETRON HCL 4 MG/2ML IJ SOLN
INTRAMUSCULAR | Status: DC | PRN
  Administered 2022-04-03: 4 mg via INTRAVENOUS

## 2022-04-03 MED ORDER — SODIUM CHLORIDE 0.9 % IV SOLN: 250.0000 mL | INTRAVENOUS | Status: DC | PRN

## 2022-04-03 MED ORDER — PROPOFOL 10 MG/ML IV BOLUS
INTRAVENOUS | Status: DC | PRN
  Administered 2022-04-03: 150 mg via INTRAVENOUS

## 2022-04-03 MED ORDER — MIDAZOLAM HCL 2 MG/2ML IJ SOLN
INTRAMUSCULAR | Status: DC | PRN
  Administered 2022-04-03 (×2): 1 mg via INTRAVENOUS

## 2022-04-03 MED ORDER — KETOROLAC TROMETHAMINE 30 MG/ML IJ SOLN: 30.0000 mg | Freq: Once | INTRAMUSCULAR | Status: DC | PRN

## 2022-04-03 MED ORDER — EPHEDRINE SULFATE-NACL 50-0.9 MG/10ML-% IV SOSY
PREFILLED_SYRINGE | INTRAVENOUS | Status: DC | PRN
  Administered 2022-04-03: 10 mg via INTRAVENOUS
  Administered 2022-04-03: 5 mg via INTRAVENOUS
  Administered 2022-04-03: 10 mg via INTRAVENOUS

## 2022-04-03 MED ORDER — AMISULPRIDE (ANTIEMETIC) 5 MG/2ML IV SOLN
INTRAVENOUS | Status: AC
  Filled 2022-04-03: qty 4

## 2022-04-03 MED ORDER — HYDROMORPHONE HCL 1 MG/ML IJ SOLN: 0.2500 mg | INTRAMUSCULAR | Status: DC | PRN

## 2022-04-03 MED ORDER — EPHEDRINE 5 MG/ML INJ
INTRAVENOUS | Status: AC
  Filled 2022-04-03: qty 5

## 2022-04-03 MED ORDER — SODIUM CHLORIDE 0.9% FLUSH: 3.0000 mL | Freq: Two times a day (BID) | INTRAVENOUS | Status: DC

## 2022-04-03 MED ORDER — LACTATED RINGERS IV SOLN: INTRAVENOUS | Status: DC

## 2022-04-03 MED ORDER — OXYCODONE HCL 5 MG PO TABS: 5.0000 mg | ORAL_TABLET | Freq: Once | ORAL | Status: DC | PRN

## 2022-04-03 MED ORDER — LIDOCAINE 2% (20 MG/ML) 5 ML SYRINGE
INTRAMUSCULAR | Status: DC | PRN
  Administered 2022-04-03: 60 mg via INTRAVENOUS

## 2022-04-03 MED ORDER — ORAL CARE MOUTH RINSE: 15.0000 mL | Freq: Once | OROMUCOSAL | Status: AC

## 2022-04-03 MED ORDER — PHENYLEPHRINE 80 MCG/ML (10ML) SYRINGE FOR IV PUSH (FOR BLOOD PRESSURE SUPPORT)
PREFILLED_SYRINGE | INTRAVENOUS | Status: AC
Start: 2022-04-03 — End: ?
  Filled 2022-04-03: qty 10

## 2022-04-03 MED ORDER — SODIUM BICARBONATE 4.2 % IV SOLN: INTRAVENOUS | Status: DC | PRN

## 2022-04-03 MED ORDER — FENTANYL CITRATE (PF) 250 MCG/5ML IJ SOLN
INTRAMUSCULAR | Status: DC | PRN
  Administered 2022-04-03: 50 ug via INTRAVENOUS
  Administered 2022-04-03: 100 ug via INTRAVENOUS

## 2022-04-03 MED ORDER — AMISULPRIDE (ANTIEMETIC) 5 MG/2ML IV SOLN
10.0000 mg | Freq: Once | INTRAVENOUS | Status: AC
Start: 2022-04-03 — End: 2022-04-03
  Administered 2022-04-03: 10 mg via INTRAVENOUS

## 2022-04-03 MED ORDER — SODIUM CHLORIDE 0.9% FLUSH: 3.0000 mL | INTRAVENOUS | Status: DC | PRN

## 2022-04-03 MED ORDER — CHLORHEXIDINE GLUCONATE CLOTH 2 % EX PADS: 6.0000 | MEDICATED_PAD | Freq: Once | CUTANEOUS | Status: DC

## 2022-04-03 MED ORDER — SODIUM BICARBONATE 4.2 % IV SOLN
INTRAVENOUS | Status: DC
  Filled 2022-04-03: qty 50

## 2022-04-03 MED ORDER — ACETAMINOPHEN 10 MG/ML IV SOLN
INTRAVENOUS | Status: AC
  Filled 2022-04-03: qty 100

## 2022-04-03 MED ORDER — SUGAMMADEX SODIUM 200 MG/2ML IV SOLN
INTRAVENOUS | Status: DC | PRN
  Administered 2022-04-03: 200 mg via INTRAVENOUS

## 2022-04-03 MED ORDER — ONDANSETRON HCL 4 MG/2ML IJ SOLN
INTRAMUSCULAR | Status: AC
  Filled 2022-04-03: qty 2

## 2022-04-03 MED ORDER — LIDOCAINE-EPINEPHRINE 1 %-1:100000 IJ SOLN
INTRAMUSCULAR | Status: DC | PRN
  Administered 2022-04-03: 10 mL via INTRADERMAL

## 2022-04-03 MED ORDER — PHENYLEPHRINE HCL-NACL 20-0.9 MG/250ML-% IV SOLN
INTRAVENOUS | Status: AC
  Filled 2022-04-03: qty 250

## 2022-04-03 MED ORDER — ACETAMINOPHEN 325 MG PO TABS: 650.0000 mg | ORAL_TABLET | ORAL | Status: DC | PRN

## 2022-04-03 MED ORDER — ONDANSETRON HCL 4 MG/2ML IJ SOLN: 4.0000 mg | Freq: Once | INTRAMUSCULAR | Status: DC | PRN

## 2022-04-03 MED ORDER — ROCURONIUM BROMIDE 10 MG/ML (PF) SYRINGE
PREFILLED_SYRINGE | INTRAVENOUS | Status: DC | PRN
  Administered 2022-04-03: 50 mg via INTRAVENOUS

## 2022-04-03 MED ORDER — ACETAMINOPHEN 650 MG RE SUPP: 650.0000 mg | RECTAL | Status: DC | PRN

## 2022-04-03 MED ORDER — PROPOFOL 10 MG/ML IV BOLUS
INTRAVENOUS | Status: AC
  Filled 2022-04-03: qty 20

## 2022-04-03 MED ORDER — PHENYLEPHRINE HCL-NACL 20-0.9 MG/250ML-% IV SOLN
INTRAVENOUS | Status: DC | PRN
  Administered 2022-04-03: 75 ug/min via INTRAVENOUS

## 2022-04-03 MED ORDER — CEFAZOLIN SODIUM-DEXTROSE 2-4 GM/100ML-% IV SOLN
2.0000 g | INTRAVENOUS | Status: AC
  Administered 2022-04-03: 2 g via INTRAVENOUS
  Filled 2022-04-03: qty 100

## 2022-04-03 MED ORDER — FENTANYL CITRATE (PF) 250 MCG/5ML IJ SOLN
INTRAMUSCULAR | Status: AC
  Filled 2022-04-03: qty 5

## 2022-04-03 MED ORDER — DEXAMETHASONE SODIUM PHOSPHATE 10 MG/ML IJ SOLN
INTRAMUSCULAR | Status: DC | PRN
  Administered 2022-04-03: 4 mg via INTRAVENOUS

## 2022-04-03 MED ORDER — OXYCODONE HCL 5 MG PO TABS: 5.0000 mg | ORAL_TABLET | ORAL | Status: DC | PRN

## 2022-04-03 MED ORDER — MIDAZOLAM HCL 2 MG/2ML IJ SOLN
INTRAMUSCULAR | Status: AC
  Filled 2022-04-03: qty 2

## 2022-04-03 MED ORDER — ROCURONIUM BROMIDE 10 MG/ML (PF) SYRINGE
PREFILLED_SYRINGE | INTRAVENOUS | Status: AC
  Filled 2022-04-03: qty 10

## 2022-04-03 MED ORDER — DEXAMETHASONE SODIUM PHOSPHATE 10 MG/ML IJ SOLN
INTRAMUSCULAR | Status: AC
  Filled 2022-04-03: qty 1

## 2022-04-03 MED ORDER — LIDOCAINE 2% (20 MG/ML) 5 ML SYRINGE
INTRAMUSCULAR | Status: AC
  Filled 2022-04-03: qty 5

## 2022-04-03 MED ORDER — ACETAMINOPHEN 10 MG/ML IV SOLN
INTRAVENOUS | Status: DC | PRN
  Administered 2022-04-03: 1000 mg via INTRAVENOUS

## 2022-04-03 MED ORDER — CHLORHEXIDINE GLUCONATE 0.12 % MT SOLN
15.0000 mL | Freq: Once | OROMUCOSAL | Status: AC
  Administered 2022-04-03: 15 mL via OROMUCOSAL
  Filled 2022-04-03: qty 15

## 2022-04-03 SURGICAL SUPPLY — 54 items
BAG COUNTER SPONGE SURGICOUNT (BAG) ×3 IMPLANT
BAG DECANTER FOR FLEXI CONT (MISCELLANEOUS) ×3 IMPLANT
BINDER BREAST XLRG (GAUZE/BANDAGES/DRESSINGS) ×1 IMPLANT
BLADE SURG 15 STRL LF DISP TIS (BLADE) IMPLANT
BLADE SURG 15 STRL SS (BLADE) ×1
CANISTER SUCT 3000ML PPV (MISCELLANEOUS) IMPLANT
CNTNR URN SCR LID CUP LEK RST (MISCELLANEOUS) ×2 IMPLANT
CONT SPEC 4OZ STRL OR WHT (MISCELLANEOUS)
COVER SURGICAL LIGHT HANDLE (MISCELLANEOUS) ×3 IMPLANT
DERMABOND ADVANCED (GAUZE/BANDAGES/DRESSINGS) ×2
DERMABOND ADVANCED .7 DNX12 (GAUZE/BANDAGES/DRESSINGS) IMPLANT
DRAPE ORTHO SPLIT 77X108 STRL (DRAPES) ×2
DRAPE SURG ORHT 6 SPLT 77X108 (DRAPES) ×4 IMPLANT
DRSG EMULSION OIL 3X3 NADH (GAUZE/BANDAGES/DRESSINGS) IMPLANT
DRSG OPSITE POSTOP 4X6 (GAUZE/BANDAGES/DRESSINGS) ×2 IMPLANT
DRSG PAD ABDOMINAL 8X10 ST (GAUZE/BANDAGES/DRESSINGS) ×2 IMPLANT
ELECT CAUTERY BLADE 6.4 (BLADE) ×3 IMPLANT
ELECT NDL TIP 2.8 STRL (NEEDLE) IMPLANT
ELECT NEEDLE TIP 2.8 STRL (NEEDLE) IMPLANT
ELECT REM PT RETURN 9FT ADLT (ELECTROSURGICAL) ×3
ELECTRODE REM PT RTRN 9FT ADLT (ELECTROSURGICAL) ×2 IMPLANT
EXTRACTOR CANIST REVOLVE STRL (CANNISTER) ×1 IMPLANT
GAUZE 4X4 16PLY ~~LOC~~+RFID DBL (SPONGE) ×3 IMPLANT
GAUZE SPONGE 4X4 12PLY STRL (GAUZE/BANDAGES/DRESSINGS) IMPLANT
GLOVE BIO SURGEON STRL SZ 6.5 (GLOVE) ×6 IMPLANT
GLOVE BIOGEL M STRL SZ7.5 (GLOVE) ×3 IMPLANT
GLOVE INDICATOR 8.0 STRL GRN (GLOVE) ×3 IMPLANT
GOWN STRL REUS W/ TWL LRG LVL3 (GOWN DISPOSABLE) ×6 IMPLANT
GOWN STRL REUS W/TWL LRG LVL3 (GOWN DISPOSABLE) ×3
KIT BASIN OR (CUSTOM PROCEDURE TRAY) ×3 IMPLANT
KIT INFILTRATION TUMESCENT 12M (MISCELLANEOUS) ×1 IMPLANT
KIT TURNOVER KIT B (KITS) ×3 IMPLANT
MARKER SKIN DUAL TIP RULER LAB (MISCELLANEOUS) ×3 IMPLANT
NDL HYPO 25GX1X1/2 BEV (NEEDLE) ×2 IMPLANT
NDL SPNL 18GX3.5 QUINCKE PK (NEEDLE) ×4 IMPLANT
NEEDLE HYPO 25GX1X1/2 BEV (NEEDLE) ×3 IMPLANT
NEEDLE SPNL 18GX3.5 QUINCKE PK (NEEDLE) ×6 IMPLANT
NS IRRIG 1000ML POUR BTL (IV SOLUTION) ×6 IMPLANT
PACK GENERAL/GYN (CUSTOM PROCEDURE TRAY) ×3 IMPLANT
PACK SURGICAL SETUP 50X90 (CUSTOM PROCEDURE TRAY) ×3 IMPLANT
PAD ARMBOARD 7.5X6 YLW CONV (MISCELLANEOUS) ×3 IMPLANT
PENCIL SMOKE EVACUATOR (MISCELLANEOUS) ×3 IMPLANT
SPIKE FLUID TRANSFER (MISCELLANEOUS) ×1 IMPLANT
SUT MNCRL AB 3-0 PS2 18 (SUTURE) ×2 IMPLANT
SUT MNCRL AB 4-0 PS2 18 (SUTURE) ×2 IMPLANT
SUT MON AB 5-0 PS2 18 (SUTURE) ×1 IMPLANT
SYR 50ML LL SCALE MARK (SYRINGE) ×6 IMPLANT
SYR BULB EAR ULCER 3OZ GRN STR (SYRINGE) IMPLANT
SYR CONTROL 10ML LL (SYRINGE) ×1 IMPLANT
TAPE STRIPS DRAPE STRL (GAUZE/BANDAGES/DRESSINGS) ×2 IMPLANT
TOWEL GREEN STERILE (TOWEL DISPOSABLE) ×3 IMPLANT
TOWEL GREEN STERILE FF (TOWEL DISPOSABLE) ×3 IMPLANT
TUBING SET GRADUATE ASPIR 12FT (MISCELLANEOUS) ×1 IMPLANT
YANKAUER SUCT BULB TIP NO VENT (SUCTIONS) IMPLANT

## 2022-04-03 NOTE — Anesthesia Postprocedure Evaluation (Signed)
Anesthesia Post Note  Patient: Ariel [name]  Procedure(s) Performed: Scar contracture release right breast with fat grafting (Right: Breast) LIPOSUCTION WITH LIPOFILLING (Bilateral: Breast)     Patient location during evaluation: PACU Anesthesia Type: General Level of consciousness: awake and alert, oriented and patient cooperative Pain management: pain level controlled Vital Signs Assessment: post-procedure vital signs reviewed and stable Respiratory status: spontaneous breathing, nonlabored ventilation and respiratory function stable Cardiovascular status: blood pressure returned to baseline and stable Postop Assessment: no apparent nausea or vomiting Anesthetic complications: no   No notable events documented.  Last Vitals:  Vitals:   04/03/22 1500 04/03/22 1515  BP: 114/79 124/72  Pulse: 76 70  Resp: 18 13  Temp: 36.7 C   SpO2: 100% 97%    Last Pain:  Vitals:   04/03/22 1500  TempSrc:   PainSc: 0-No pain                 Pervis Hocking

## 2022-04-03 NOTE — Discharge Instructions (Signed)
INSTRUCTIONS FOR AFTER BREAST SURGERY   You will likely have some questions about what to expect following your operation.  The following information will help you and your family understand what to expect when you are discharged from the hospital.  Following these guidelines will help ensure a smooth recovery and reduce risks of complications.  Postoperative instructions include information on: diet, wound care, medications and physical activity.  AFTER SURGERY Expect to go home after the procedure.  In some cases, you may need to spend one night in the hospital for observation.  DIET Breast surgery does not require a specific diet.  However, the healthier you eat the better your body can start healing. It is important to increasing your protein intake.  This means limiting the foods with sugar and carbohydrates.  Focus on vegetables and some meat.  If you have any liposuction during your procedure be sure to drink water.  If your urine is bright yellow, then it is concentrated, and you need to drink more water.  As a general rule after surgery, you should have 8 ounces of water every hour while awake.  If you find you are persistently nauseated or unable to take in liquids let us know.  NO TOBACCO USE or EXPOSURE.  This will slow your healing process and increase the risk of a wound.  WOUND CARE Leave the ACE wrap or binder on for 3 days . Use fragrance free soap.   After 3 days you can remove the ACE wrap or binder to shower. Once dry apply ACE wrap, binder or sports bra.  Use a mild soap like Dial, Dove and Ivory. You may have Topifoam or Lipofoam on.  It is soft and spongy and helps keep you from getting creases if you have liposuction.  This can be removed before the shower and then replaced.  If you need more it is available on [employer] (Lipofoam). If you have steri-strips / tape directly attached to your skin leave them in place. It is OK to get these wet.   No baths, pools or hot tubs for four  weeks. We close your incision to leave the smallest and best-looking scar. No ointment or creams on your incisions until given the go ahead.  Especially not Neosporin (Too many skin reactions with this one).  A few weeks after surgery you can use Mederma and start massaging the scar. We ask you to wear your binder or sports bra for the first 6 weeks around the clock, including while sleeping. This provides added comfort and helps reduce the fluid accumulation at the surgery site.  ACTIVITY No heavy lifting until cleared by the doctor.  This usually means no more than a half-gallon of milk.  It is OK to walk and climb stairs. In fact, moving your legs is very important to decrease your risk of a blood clot.  It will also help keep you from getting deconditioned.  Every 1 to 2 hours get up and walk for 5 minutes. This will help with a quicker recovery back to normal.  Let pain be your guide so you don't do too much.  This is not the time for spring cleaning and don't plan on taking care of anyone else.  This time is for you to recover,  You will be more comfortable if you sleep and rest with your head elevated either with a few pillows under you or in a recliner.  No stomach sleeping for a three months.  WORK Everyone   returns to work at different times. As a rough guide, most people take at least 1 - 2 weeks off prior to returning to work. If you need documentation for your job, bring the forms to your postoperative follow up visit.  DRIVING Arrange for someone to bring you home from the hospital.  You may be able to drive a few days after surgery but not while taking any narcotics or valium.  BOWEL MOVEMENTS Constipation can occur after anesthesia and while taking pain medication.  It is important to stay ahead for your comfort.  We recommend taking Milk of Magnesia (2 tablespoons; twice a day) while taking the pain pills.  MEDICATIONS You may be prescribed should start after surgery At your  preoperative visit for you history and physical you may have been given the following medications: An antibiotic: Start this medication when you get home and take according to the instructions on the bottle. Zofran 4 mg:  This is to treat nausea and vomiting.  You can take this every 6 hours as needed and only if needed. Valium 2 mg: This is for muscle tightness if you have an implant or expander. This will help relax your muscle which also helps with pain control.  This can be taken every 12 hours as needed. Don't drive after taking this medication. Norco (hydrocodone/acetaminophen) 5/325 mg:  This is only to be used after you have taken the motrin or the tylenol. Every 8 hours as needed.   Over the counter Medication to take: Ibuprofen (Motrin) 600 mg:  Take this every 6 hours.  If you have additional pain then take 500 mg of the tylenol every 8 hours.  Only take the Norco after you have tried these two. Miralax or stool softener of choice: Take this according to the bottle if you take the Norco.  WHEN TO CALL Call your surgeon's office if any of the following occur: Fever 101 degrees F or greater Excessive bleeding or fluid from the incision site. Pain that increases over time without aid from the medications Redness, warmth, or pus draining from incision sites Persistent nausea or inability to take in liquids Severe misshapen area that underwent the operation. 

## 2022-04-03 NOTE — Anesthesia Procedure Notes (Signed)
Procedure Name: Intubation Date/Time: 04/03/2022 12:32 PM  Performed by: Cathren Harsh, CRNAPre-anesthesia Checklist: Patient identified, Emergency Drugs available, Suction available and Patient being monitored Patient Re-evaluated:Patient Re-evaluated prior to induction Oxygen Delivery Method: Circle System Utilized Preoxygenation: Pre-oxygenation with 100% oxygen Induction Type: IV induction Ventilation: Mask ventilation without difficulty Laryngoscope Size: Mac and 3 Grade View: Grade III Tube type: Oral Tube size: 7.0 mm Number of attempts: 1 Airway Equipment and Method: Stylet and Oral airway Placement Confirmation: ETT inserted through vocal cords under direct vision, positive ETCO2 and breath sounds checked- equal and bilateral Secured at: 21 cm Tube secured with: Tape Dental Injury: Teeth and Oropharynx as per pre-operative assessment

## 2022-04-03 NOTE — Interval H&P Note (Signed)
History and Physical Interval Note:  04/03/2022 11:27 AM  Ariel [name]  has presented today for surgery, with the diagnosis of Scar Contracture.  The various methods of treatment have been discussed with the patient and family. After consideration of risks, benefits and other options for treatment, the patient has consented to  Procedure(s): Scar contracture release right breast with fat grafting (Right) LIPOSUCTION WITH LIPOFILLING (Right) as a surgical intervention.  The patient's history has been reviewed, patient examined, no change in status, stable for surgery.  I have reviewed the patient's chart and labs.  Questions were answered to the patient's satisfaction.     Loel Lofty Ahley Bulls

## 2022-04-03 NOTE — Anesthesia Preprocedure Evaluation (Signed)
Anesthesia Evaluation  Patient identified by MRN, date of birth, ID band Patient awake    Reviewed: Allergy & Precautions, NPO status , Patient's Chart, lab work & pertinent test results  Airway Mallampati: I  TM Distance: >3 FB Neck ROM: Full    Dental  (+) Partial Upper, Dental Advisory Given,    Pulmonary shortness of breath and with exertion, former smoker,  Uses albuterol very rarely    Pulmonary exam normal breath sounds clear to auscultation       Cardiovascular hypertension (133/98 in preop), Pt. on medications Normal cardiovascular exam Rhythm:Regular Rate:Normal  Took losartan    Neuro/Psych PSYCHIATRIC DISORDERS Anxiety Depression negative neurological ROS     GI/Hepatic negative GI ROS, (+)       marijuana use, Occasional edibles, not daily    Endo/Other  Hypothyroidism   Renal/GU negative Renal ROS  negative genitourinary   Musculoskeletal  (+) Arthritis , Osteoarthritis,    Abdominal   Peds negative pediatric ROS (+)  Hematology negative hematology ROS (+) hct 42.7   Anesthesia Other Findings   Reproductive/Obstetrics negative OB ROS                             Anesthesia Physical Anesthesia Plan  ASA: 2  Anesthesia Plan: General   Post-op Pain Management: Ofirmev IV (intra-op)* and Toradol IV (intra-op)*   Induction: Intravenous  PONV Risk Score and Plan: 3 and Ondansetron, Dexamethasone, Midazolam and Treatment may vary due to age or medical condition  Airway Management Planned: Oral ETT  Additional Equipment: None  Intra-op Plan:   Post-operative Plan: Extubation in OR  Informed Consent: I have reviewed the patients History and Physical, chart, labs and discussed the procedure including the risks, benefits and alternatives for the proposed anesthesia with the patient or authorized representative who has indicated his/her understanding and acceptance.      Dental advisory given  Plan Discussed with: CRNA  Anesthesia Plan Comments:         Anesthesia Quick Evaluation

## 2022-04-03 NOTE — Op Note (Signed)
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   Excision of right breast excess tissue dog ear 1 x 4 cm 3.   Excision of left breast excess tissue dog ear 1 x 4 cm 4.   Fat grafting to right breast 120 cc 5.   Fat grafting to left breast 80 cc  SURGEON: Lyndee Leo Sanger Vivek Grealish, DO  ASSISTANT: Donnamarie Rossetti , PA  EBL: 5 cc  CONDITION: Stable  COMPLICATIONS: None  INDICATION: The patient, Ariel [name], is a 61 y.o. female born on 1961/06/26, is here for treatment of bilateral breast contracture after breast reduction.  She underwent bilateral breast reductions and had wound development and skin breakdown.  She also had bilateral dogears in the lateral aspect of both breasts.  The decision was made for improved symmetry and removal of the contractures and dogears.  PROCEDURE DETAILS:  The patient was seen prior to surgery and marked.  The IV antibiotics were given. The patient was taken to the operating room and given a general anesthetic. A standard time out was performed and all information was confirmed by those in the room. SCDs were placed.   The breast and abdomen were prepped and draped.   Local was placed in the breast area as mentioned below for intraoperative hemostasis and postop pain management.  Tumescent was placed in the abdomen through a 2 mm incision in a previous vertical scar on the lower abdominal area.  After waiting the appropriate amount of time liposuction was done.  The fat was collected in the revolve system.  It was cleaned according to the manufacture guidelines.  The forked knife was used to release and excise the scar contracture of the right breast 1 x 4 cm in size.  The prepared fat was then injected for a total of 120 cc around the areola and  deep to it as well as the vertical limb.  The 2 x 4 cm dogear in the lateral aspect was excised with a #10 blade.  Hemostasis was achieved with electrocautery.  The deep layers were closed with 3-0 Monocryl followed by 4-0 Monocryl.    Attention was then turned to the left breast.  The left breast dogear was excised for 1 to 4 cm and the deep layer was closed with 3-0 Monocryl.  4-0 Monocryl was then used to close the skin.  Hemostasis was achieved prior to closure with the electrocautery.  The forked knife was then used to release an area of scar tissue on the lateral aspect of the areola.  The prepared fat was injected at the inframammary fold and areola for a total of 80 cc.  All incisions were closed with 5-0 Monocryl that were used for injection of the fat.  Dermabond and Steri-Strips were applied to all areas. An abdominal binder and breast binder were placed. The patient was allowed to wake up and taken to recovery room in stable condition at the end of the case. The family was notified at the end of the case.   The advanced practice practitioner (APP) assisted throughout the case.  The APP was essential in retraction and counter traction when needed to make the case progress smoothly.  This retraction and assistance made it possible to see the tissue plans for the procedure.  The assistance was needed  for blood control, tissue re-approximation and assisted with closure of the incision site.

## 2022-04-03 NOTE — Transfer of Care (Signed)
Immediate Anesthesia Transfer of Care Note  Patient: Ariel [name]  Procedure(s) Performed: Scar contracture release right breast with fat grafting (Right: Breast) LIPOSUCTION WITH LIPOFILLING (Bilateral: Breast)  Patient Location: PACU  Anesthesia Type:General  Level of Consciousness: awake, drowsy, patient cooperative and responds to stimulation  Airway & Oxygen Therapy: Patient Spontanous Breathing and Patient connected to nasal cannula oxygen  Post-op Assessment: Report given to RN and Post -op Vital signs reviewed and stable  Post vital signs: Reviewed and stable  Last Vitals:  Vitals Value Taken Time  BP 114/79 04/03/22 1458  Temp    Pulse 80 04/03/22 1459  Resp 15 04/03/22 1459  SpO2 100 % 04/03/22 1459  Vitals shown include unvalidated device data.  Last Pain:  Vitals:   04/03/22 1020  TempSrc:   PainSc: 0-No pain         Complications: No notable events documented.

## 2022-04-04 ENCOUNTER — Encounter (HOSPITAL_COMMUNITY): Payer: Self-pay | Admitting: Plastic Surgery

## 2022-04-15 NOTE — Progress Notes (Signed)
Patient is a 61 year old female with history of symptomatic mammary hypertrophy. She underwent bilateral breast reduction with Dr. Marla Roe on 08/03/20. Postoperatively she developed wounds and breakdown of the bilateral NACs, with the necrosis on the right side. There was resulting breast asymmetry and scar contracture of the right breast. Patient underwent excision of scar contracture to the right breast, excision of excess dog ear tissue to the breasts bilaterally and fat grafting to the breasts bilaterally with Dr. Baltazar Apo on 04/03/22. Patient presents for postoperative follow up today.   Today patient reports she is doing okay.  She states that she is having some tenderness at the surgical sites.  Patient also feels her right NAC is still contracted down.  She does state that she feels the breasts are more symmetrical.  She denies any other issues or concerns.  She denies any fevers or chills.  She denies any drainage at the surgical sites.  Chaperone present on exam.  On exam, patient is sitting upright in no acute distress. Breasts are soft bilaterally. No erythema noted. Minimal ecchymosis noted to bilateral breasts. Incisions are intact. There is a small fluid collection noted to the right lateral incision. There is no redness, drainage or malodor. There is no swelling, drainage or redness noted to the incisions on the left breast or abdominal incision.   Right incision site was cleaned with alcohol. A butterfly needle was used to drain approximately 25 cc of serous fluid from the area. No purulent drainage or bright red blood was noted.   Discussed with patient to continue compression with binder. Discussed she may put gauze and medipore tape or ABD pads over the incision sites if she wants to for comfort. Discussed with patient that if the right lateral area were to become swollen again, erythematous or drainage is noted to call us. Instructed patient that if she has fevers to call us.   We  also discussed the possibility of nipple tattooing.  Discussed with patient that if she would like to undergo nipple tattooing, this would occur a few months from now after completely healing from surgery.  Patient to call back with any questions or concerns.   Pictures were obtained and placed in the patient's chart with the patient's permission  Patient to follow-up next week.

## 2022-04-16 ENCOUNTER — Other Ambulatory Visit: Payer: Self-pay | Admitting: *Deleted

## 2022-04-16 ENCOUNTER — Ambulatory Visit (INDEPENDENT_AMBULATORY_CARE_PROVIDER_SITE_OTHER): Payer: 59 | Admitting: Student

## 2022-04-16 DIAGNOSIS — N6489 Other specified disorders of breast: Secondary | ICD-10-CM

## 2022-04-16 MED ORDER — HYDROCHLOROTHIAZIDE 25 MG PO TABS: 25.0000 mg | ORAL_TABLET | Freq: Every day | ORAL | 1 refills | Status: DC

## 2022-04-24 ENCOUNTER — Encounter: Payer: 59 | Admitting: Plastic Surgery

## 2022-05-02 ENCOUNTER — Other Ambulatory Visit: Payer: Self-pay | Admitting: *Deleted

## 2022-05-02 MED ORDER — FUROSEMIDE 20 MG PO TABS
ORAL_TABLET | ORAL | 1 refills | Status: DC
Start: 2022-05-02 — End: 2022-06-27

## 2022-05-02 MED ORDER — ATORVASTATIN CALCIUM 20 MG PO TABS: 20.0000 mg | ORAL_TABLET | Freq: Every day | ORAL | 1 refills | Status: DC

## 2022-05-02 NOTE — Telephone Encounter (Signed)
Fax also requested a refill on Sertraline '100mg'$  daily which is not on the current medication list.  Please Mychart message from 5/15 in which instructions were given by Dr Ethlyn Gallery to discontinue?

## 2022-05-03 ENCOUNTER — Encounter: Payer: Self-pay | Admitting: Plastic Surgery

## 2022-05-03 ENCOUNTER — Ambulatory Visit (INDEPENDENT_AMBULATORY_CARE_PROVIDER_SITE_OTHER): Payer: 59 | Admitting: Plastic Surgery

## 2022-05-03 DIAGNOSIS — Z9889 Other specified postprocedural states: Secondary | ICD-10-CM

## 2022-05-03 NOTE — Progress Notes (Signed)
   Subjective:    Patient ID: Ariel [name], female    DOB: November 03, 1960, 61 y.o.   MRN: [id number]  The patient is a 61 year old female here for follow-up after undergoing fat grafting to both breasts.  She has no sign of infection, hematoma or seroma.  She has lumps and bumps as expected from the fat grafting.  Overall it has a better appearance and is softer.  There is better symmetry.  She would like to move forward with the nipple areola tattoo of the right side.      Review of Systems  Constitutional: Negative.   Eyes: Negative.   Respiratory: Negative.    Cardiovascular: Negative.   Gastrointestinal: Negative.   Endocrine: Negative.   Genitourinary: Negative.        Objective:   Physical Exam Constitutional:      Appearance: Normal appearance.  Cardiovascular:     Rate and Rhythm: Normal rate.     Pulses: Normal pulses.  Pulmonary:     Effort: Pulmonary effort is normal.  Skin:    Capillary Refill: Capillary refill takes less than 2 seconds.  Neurological:     Mental Status: She is alert and oriented to person, place, and time.  Psychiatric:        Mood and Affect: Mood normal.        Behavior: Behavior normal.        Thought Content: Thought content normal.       Assessment & Plan:     ICD-10-CM   1. S/P bilateral breast reduction  Z98.890       Pictures were obtained of the patient and placed in the chart with the patient's or guardian's permission. Plan on nipple areola tattoo of the right side.  I would like to see her back in 6 months.  Continue with sports bra and massage to the areas of fat necrosis.

## 2022-05-07 ENCOUNTER — Encounter: Payer: Self-pay | Admitting: Internal Medicine

## 2022-05-28 ENCOUNTER — Telehealth: Payer: Self-pay | Admitting: *Deleted

## 2022-05-28 NOTE — Telephone Encounter (Signed)
No call by 5 pm- cancelled PV and colon- no show letter via My Chart

## 2022-05-28 NOTE — Telephone Encounter (Signed)
Patient no showed PV today- Called patient and left message to return call by 5 pm today- If no call by 5 pm, PV and procedure will be canceled -

## 2022-05-30 ENCOUNTER — Other Ambulatory Visit: Payer: Self-pay

## 2022-05-30 ENCOUNTER — Other Ambulatory Visit: Payer: Self-pay | Admitting: Internal Medicine

## 2022-05-30 ENCOUNTER — Ambulatory Visit (AMBULATORY_SURGERY_CENTER): Payer: Self-pay | Admitting: *Deleted

## 2022-05-30 VITALS — Ht 63.0 in | Wt 165.0 lb

## 2022-05-30 DIAGNOSIS — Z1211 Encounter for screening for malignant neoplasm of colon: Secondary | ICD-10-CM

## 2022-05-30 MED ORDER — NA SULFATE-K SULFATE-MG SULF 17.5-3.13-1.6 GM/177ML PO SOLN: 1.0000 | Freq: Once | ORAL | 0 refills | Status: AC

## 2022-05-30 NOTE — Progress Notes (Signed)
Pre visit completed in person. Patient with constipation cannot tolerate laxatives causes anal leakage.  Suggested daily stool softeners 3-5 days leading up to prepping for colonoscopy.   No egg or soy allergy known to patient  No issues known to pt with past sedation with any surgeries or procedures Patient denies ever being told they had issues or difficulty with intubation  No FH of Malignant Hyperthermia Pt is not on diet pills Pt is not on  home 02  Pt is not on blood thinners  Pt denies issues with constipation  No A fib or A flutter  Pt instructed to use Singlecare.com or GoodRx for a price reduction on prep

## 2022-06-11 ENCOUNTER — Encounter: Payer: Self-pay | Admitting: Internal Medicine

## 2022-06-17 ENCOUNTER — Other Ambulatory Visit: Payer: Self-pay | Admitting: *Deleted

## 2022-06-17 MED ORDER — POTASSIUM CHLORIDE ER 10 MEQ PO TBCR
10.0000 meq | EXTENDED_RELEASE_TABLET | ORAL | 1 refills | Status: DC
Start: 2022-06-17 — End: 2022-06-27

## 2022-06-19 ENCOUNTER — Encounter: Payer: 59 | Admitting: Internal Medicine

## 2022-06-19 ENCOUNTER — Encounter: Payer: Self-pay | Admitting: Internal Medicine

## 2022-06-19 ENCOUNTER — Ambulatory Visit (AMBULATORY_SURGERY_CENTER): Payer: 59 | Admitting: Internal Medicine

## 2022-06-19 ENCOUNTER — Telehealth: Payer: Self-pay | Admitting: Internal Medicine

## 2022-06-19 VITALS — BP 138/86 | HR 67 | Temp 96.2°F | Resp 14 | Ht 63.0 in | Wt 165.0 lb

## 2022-06-19 DIAGNOSIS — K5140 Inflammatory polyps of colon without complications: Secondary | ICD-10-CM

## 2022-06-19 DIAGNOSIS — C7A026 Malignant carcinoid tumor of the rectum: Secondary | ICD-10-CM

## 2022-06-19 DIAGNOSIS — Z1211 Encounter for screening for malignant neoplasm of colon: Secondary | ICD-10-CM

## 2022-06-19 DIAGNOSIS — D128 Benign neoplasm of rectum: Secondary | ICD-10-CM

## 2022-06-19 DIAGNOSIS — D3A8 Other benign neuroendocrine tumors: Secondary | ICD-10-CM | POA: Diagnosis not present

## 2022-06-19 DIAGNOSIS — D123 Benign neoplasm of transverse colon: Secondary | ICD-10-CM

## 2022-06-19 MED ORDER — SODIUM CHLORIDE 0.9 % IV SOLN: 500.0000 mL | Freq: Once | INTRAVENOUS | Status: DC

## 2022-06-19 NOTE — Op Note (Signed)
[facility] Patient Name: Ariel [name] Procedure Date: 06/19/2022 10:31 AM MRN: [id number] Endoscopist: Jerene Bears , MD Age: 61 Referring MD:  Date of Birth: 1961/09/25 Gender: Female Account #: [id number] Procedure:                Colonoscopy Indications:              Screening for colorectal malignant neoplasm, This                            is the patient's first colonoscopy Medicines:                Monitored Anesthesia Care Procedure:                Pre-Anesthesia Assessment:                           - Prior to the procedure, a History and Physical                            was performed, and patient medications and                            allergies were reviewed. The patient's tolerance of                            previous anesthesia was also reviewed. The risks                            and benefits of the procedure and the sedation                            options and risks were discussed with the patient.                            All questions were answered, and informed consent                            was obtained. Prior Anticoagulants: The patient has                            taken no previous anticoagulant or antiplatelet                            agents. ASA Grade Assessment: II - A patient with                            mild systemic disease. After reviewing the risks                            and benefits, the patient was deemed in                            satisfactory condition to undergo the procedure.  After obtaining informed consent, the colonoscope                            was passed under direct vision. Throughout the                            procedure, the patient's blood pressure, pulse, and                            oxygen saturations were monitored continuously. The                            Olympus PCF-H190DL [id number]) Colonoscope was                            introduced through the anus  and advanced to the                            cecum, identified by appendiceal orifice and                            ileocecal valve. The colonoscopy was performed                            without difficulty. The patient tolerated the                            procedure well. The quality of the bowel                            preparation was good. The ileocecal valve,                            appendiceal orifice, and rectum were photographed. Scope In: 10:47:49 AM Scope Out: 11:05:27 AM Scope Withdrawal Time: 0 hours 12 minutes 14 seconds  Total Procedure Duration: 0 hours 17 minutes 38 seconds  Findings:                 The digital rectal exam was normal.                           A 2 mm polyp was found in the proximal transverse                            colon. The polyp was sessile. The polyp was removed                            with a cold biopsy forceps. Resection and retrieval                            were complete.                           A 5 mm polyp was found in the distal transverse  colon. The polyp was sessile. The polyp was removed                            with a cold snare. Resection and retrieval were                            complete.                           A 4 mm polyp was found in the distal rectum. The                            polyp was sessile. The polyp was removed with a                            cold snare. Resection and retrieval were complete.                           One 20 mm submucosal nodule was found in the                            sigmoid colon. This was rather firm though mobile.                           Multiple small and large-mouthed diverticula were                            found in the sigmoid colon and descending colon.                           The retroflexed view of the distal rectum and anal                            verge was normal and showed no anal or rectal                             abnormalities. Complications:            No immediate complications. Estimated Blood Loss:     Estimated blood loss was minimal. Impression:               - One 2 mm polyp in the proximal transverse colon,                            removed with a cold biopsy forceps. Resected and                            retrieved.                           - One 5 mm polyp in the distal transverse colon,                            removed with a cold snare. Resected  and retrieved.                           - One 4 mm polyp in the distal rectum, removed with                            a cold snare. Resected and retrieved.                           - Submucosal nodule in the sigmoid colon.                           - Severe diverticulosis in the sigmoid colon and in                            the descending colon.                           - The distal rectum and anal verge are normal on                            retroflexion view. Recommendation:           - Patient has a contact number available for                            emergencies. The signs and symptoms of potential                            delayed complications were discussed with the                            patient. Return to normal activities tomorrow.                            Written discharge instructions were provided to the                            patient.                           - Resume previous diet.                           - Continue present medications.                           - Await pathology results.                           - CT scan abd/pelvis with contrast to further                            characterize the sigmoid submucosal nodule seen  today.                           - Repeat colonoscopy is recommended. The                            colonoscopy date will be determined after pathology                            results from today's exam become available for                             review. Jerene Bears, MD 06/19/2022 11:12:29 AM This report has been signed electronically.

## 2022-06-19 NOTE — Progress Notes (Signed)
GASTROENTEROLOGY PROCEDURE H&P NOTE   Primary Care Physician: Farrel Conners, MD    Reason for Procedure:  Colon cancer screening  Plan:    Colonoscopy  Patient is appropriate for endoscopic procedure(s) in the ambulatory ([facility]) setting.  The nature of the procedure, as well as the risks, benefits, and alternatives were carefully and thoroughly reviewed with the patient. Ample time for discussion and questions allowed. The patient understood, was satisfied, and agreed to proceed.     HPI: Ariel [name] is a 61 y.o. female who presents for screening colonoscopy.  Medical history as below.  Tolerated the prep.  No recent chest pain or shortness of breath.  No abdominal pain today.  Past Medical History:  Diagnosis Date   Anxiety    Chronic bronchitis (HCC)    Depression    Dyspnea    occasional with exertion   Fibroids, intramural    Goiter    History of blood transfusion 1964   Hyperlipidemia    Hypertension    Hypothyroidism    Open wound of right breast 10/03/2020   Osteopenia 01/2018   T score -1.3 FRAX 3.8% / 0.5%   Thyroid disease    Tobacco use 05/29/2017    Past Surgical History:  Procedure Laterality Date   ABDOMINAL HYSTERECTOMY     07/2017-Dr [place] per patient; done for fibroids   BREAST REDUCTION SURGERY Bilateral 08/03/2020   Procedure: BREAST REDUCTION WITH LIPOSUCTION;  Surgeon: Wallace Going, DO;  Location: [facility];  Service: Plastics;  Laterality: Bilateral;  3.5 hours   BUNIONECTOMY Right    CARDIAC ELECTROPHYSIOLOGY STUDY AND ABLATION  2010   hx tachycardia    LAPAROTOMY N/A 06/26/2017   Procedure: EXPLORATORY LAPAROTOMY;  Surgeon: Everitt Amber, MD;  Location: [facility];  Service: Gynecology;  Laterality: N/A;   LESION EXCISION WITH COMPLEX REPAIR Right 04/03/2022   Procedure: Scar contracture release right breast with fat grafting;  Surgeon: Wallace Going, DO;  Location: [facility];  Service: Plastics;   Laterality: Right;   LIPOSUCTION WITH LIPOFILLING Bilateral 04/03/2022   Procedure: LIPOSUCTION WITH LIPOFILLING;  Surgeon: Wallace Going, DO;  Location: [facility];  Service: Plastics;  Laterality: Bilateral;   THYROID LOBECTOMY Left 06/18/2019   Procedure: LEFT THYROID LOBECTOMY;  Surgeon: Armandina Gemma, MD;  Location: [facility];  Service: General;  Laterality: Left;   [place]     Prior to Admission medications   Medication Sig Start Date End Date Taking? Authorizing Provider  albuterol (VENTOLIN HFA) 108 (90 Base) MCG/ACT inhaler TAKE 2 PUFFS BY MOUTH EVERY 6 HOURS AS NEEDED FOR WHEEZE OR SHORTNESS OF BREATH 02/26/22   Koberlein, Andris Flurry C, MD  atorvastatin (LIPITOR) 20 MG tablet Take 1 tablet (20 mg total) by mouth daily. 05/02/22   Kennyth Arnold, FNP  desvenlafaxine (PRISTIQ) 50 MG 24 hr tablet Take 1 tablet (50 mg total) by mouth daily. 03/06/22   Caren Macadam, MD  furosemide (LASIX) 20 MG tablet TAKE 1 TABLET BY MOUTH DAILY AS NEEDED FOR EDEMA 05/02/22   Dutch Quint B, FNP  hydrochlorothiazide (HYDRODIURIL) 25 MG tablet Take 1 tablet (25 mg total) by mouth daily. 04/16/22   Dutch Quint B, FNP  levothyroxine (SYNTHROID) 50 MCG tablet TAKE 1 TABLET BY MOUTH EVERY DAY 03/15/22   Koberlein, Steele Berg, MD  losartan (COZAAR) 25 MG tablet TAKE 1 TABLET (25 MG TOTAL) BY MOUTH DAILY.  03/15/22   Caren Macadam, MD  meloxicam (MOBIC) 15 MG tablet Take 15 mg by mouth daily.    [provider]  meloxicam (MOBIC) 15 MG tablet Take 1 tablet by mouth daily. 04/01/22   [provider]  NONFORMULARY OR COMPOUNDED ITEM Estradiol vaginal cream 7.37% 1 applicator twice weekly Patient taking differently: Place 1 application  vaginally 2 (two) times a week. Estradiol vaginal cream 1.06% 1 applicator twice weekly 10/26/21   Princess Bruins, MD  potassium chloride (KLOR-CON) 10 MEQ tablet Take 1 tablet (10 mEq total) by mouth 3 (three) times a  week. 06/17/22   Farrel Conners, MD  Spacer/Aero-Holding Josiah Lobo DEVI 1 Device by Does not apply route daily as needed. 11/20/20   Caren Macadam, MD  traZODone (DESYREL) 50 MG tablet Take 25-50 mg by mouth at bedtime as needed. 04/07/22   [provider]  triamcinolone cream (KENALOG) 0.1 % Apply to affected area twice daily for 2 weeks time. 02/14/22   Caren Macadam, MD    Current Outpatient Medications  Medication Sig Dispense Refill   albuterol (VENTOLIN HFA) 108 (90 Base) MCG/ACT inhaler TAKE 2 PUFFS BY MOUTH EVERY 6 HOURS AS NEEDED FOR WHEEZE OR SHORTNESS OF BREATH 6.7 each 2   atorvastatin (LIPITOR) 20 MG tablet Take 1 tablet (20 mg total) by mouth daily. 90 tablet 1   desvenlafaxine (PRISTIQ) 50 MG 24 hr tablet Take 1 tablet (50 mg total) by mouth daily. 90 tablet 3   furosemide (LASIX) 20 MG tablet TAKE 1 TABLET BY MOUTH DAILY AS NEEDED FOR EDEMA 90 tablet 1   hydrochlorothiazide (HYDRODIURIL) 25 MG tablet Take 1 tablet (25 mg total) by mouth daily. 90 tablet 1   levothyroxine (SYNTHROID) 50 MCG tablet TAKE 1 TABLET BY MOUTH EVERY DAY 90 tablet 1   losartan (COZAAR) 25 MG tablet TAKE 1 TABLET (25 MG TOTAL) BY MOUTH DAILY. 90 tablet 1   meloxicam (MOBIC) 15 MG tablet Take 15 mg by mouth daily.     meloxicam (MOBIC) 15 MG tablet Take 1 tablet by mouth daily.     NONFORMULARY OR COMPOUNDED ITEM Estradiol vaginal cream 2.69% 1 applicator twice weekly (Patient taking differently: Place 1 application  vaginally 2 (two) times a week. Estradiol vaginal cream 4.85% 1 applicator twice weekly) 90 each 3   potassium chloride (KLOR-CON) 10 MEQ tablet Take 1 tablet (10 mEq total) by mouth 3 (three) times a week. 36 tablet 1   Spacer/Aero-Holding Chambers DEVI 1 Device by Does not apply route daily as needed. 1 each 0   traZODone (DESYREL) 50 MG tablet Take 25-50 mg by mouth at bedtime as needed.     triamcinolone cream (KENALOG) 0.1 % Apply to affected area twice daily for 2  weeks time. 30 g 2   Current Facility-Administered Medications  Medication Dose Route Frequency Provider Last Rate Last Admin   0.9 %  sodium chloride infusion  500 mL Intravenous Once Jenesa Foresta, Lajuan Lines, MD        Allergies as of 06/19/2022 - Review Complete 05/30/2022  Allergen Reaction Noted   Wellbutrin [bupropion] Other (See Comments) 11/24/2020    Family History  Problem Relation Age of Onset   Hypertension Mother    Renal Disease Mother    Diabetes Father    Hypertension Father    Renal Disease Father        on dialysis   Thyroid disease Maternal Aunt    Stroke Maternal Grandmother  Breast cancer Maternal Grandmother        diagnosed in her 21's   Cancer Paternal Grandmother        ?   Healthy Daughter    Healthy Son    Colon polyps Neg Hx    Colon cancer Neg Hx    Esophageal cancer Neg Hx    Rectal cancer Neg Hx    Stomach cancer Neg Hx     Social History   Socioeconomic History   Marital status: Single    Spouse name: Not on file   Number of children: Not on file   Years of education: Not on file   Highest education level: Not on file  Occupational History   Not on file  Tobacco Use   Smoking status: Former    Packs/day: 0.25    Years: 30.00    Total pack years: 7.50    Types: Cigarettes    Quit date: 06/17/2019    Years since quitting: 3.0   Smokeless tobacco: Never  Vaping Use   Vaping Use: Never used  Substance and Sexual Activity   Alcohol use: Not Currently    Comment: OCC   Drug use: Yes    Types: Marijuana    Comment: Occas - last use 03/21/22   Sexual activity: Yes    Partners: Male    Comment: 1ST INTERCOURSE- 41, PARTNERS- 83, CURRENT PARTNER- 3.5 YRS   Other Topics Concern   Not on file  Social History Narrative   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 Christian, no church currenty      Lifestyle: "poor diet"; no exercise   Social Determinants of Adult nurse Strain: Not on file  Food Insecurity: Not on file  Transportation Needs: Not on file  Physical Activity: Not on file  Stress: Not on file  Social Connections: Not on file  Intimate Partner Violence: Not on file    Physical Exam: Vital signs in last 24 hours: '@BP'$  (!) 144/92   Pulse 94   Temp (!) 96.2 F (35.7 C) (Temporal)   Ht '5\' 3"'$  (1.6 m)   Wt 165 lb (74.8 kg)   LMP 06/29/2017   SpO2 96%   BMI 29.23 kg/m  GEN: NAD EYE: Sclerae anicteric ENT: MMM CV: Non-tachycardic Pulm: CTA b/l GI: Soft, NT/ND NEURO:  Alert & Oriented x 3   Zenovia Jarred, MD [facility]  06/19/2022 10:33 AM

## 2022-06-19 NOTE — Telephone Encounter (Signed)
Pt stated that she was still going to the bathroom and was worried about having an accident on the way to [facility]. Advised pt to wear a depends if she has any or put down towels in the car for the ride to facility. Pt verbalized understanding and stated she should arrive at [facility] in 15-20 minutes.

## 2022-06-19 NOTE — Progress Notes (Signed)
VS completed by DT.  Pt's states no medical or surgical changes since previsit or office visit.  

## 2022-06-19 NOTE — Patient Instructions (Signed)
Handouts on polyps & diverticulosis given to you today.Await pathology results on polyps removed   A CT scan will be ordered by Dr Vena Rua office & they will call you with date and time  2 bottles of contrast given to you today .When the office calls you with date and time ,they will tell you when to drink this contrast prior to CT scan ( see paper in with Contrast )    YOU HAD AN ENDOSCOPIC PROCEDURE TODAY AT [facility]:   Refer to the procedure report that was given to you for any specific questions about what was found during the examination.  If the procedure report does not answer your questions, please call your gastroenterologist to clarify.  If you requested that your care partner not be given the details of your procedure findings, then the procedure report has been included in a sealed envelope for you to review at your convenience later.  YOU SHOULD EXPECT: Some feelings of bloating in the abdomen. Passage of more gas than usual.  Walking can help get rid of the air that was put into your GI tract during the procedure and reduce the bloating. If you had a lower endoscopy (such as a colonoscopy or flexible sigmoidoscopy) you may notice spotting of blood in your stool or on the toilet paper. If you underwent a bowel prep for your procedure, you may not have a normal bowel movement for a few days.  Please Note:  You might notice some irritation and congestion in your nose or some drainage.  This is from the oxygen used during your procedure.  There is no need for concern and it should clear up in a day or so.  SYMPTOMS TO REPORT IMMEDIATELY:  Following lower endoscopy (colonoscopy or flexible sigmoidoscopy):  Excessive amounts of blood in the stool  Significant tenderness or worsening of abdominal pains  Swelling of the abdomen that is new, acute  Fever of 100F or higher   For urgent or emergent issues, a gastroenterologist can be reached at any hour by calling  [phone]. Do not use MyChart messaging for urgent concerns.    DIET:  We do recommend a small meal at first, but then you may proceed to your regular diet.  Drink plenty of fluids but you should avoid alcoholic beverages for 24 hours.  ACTIVITY:  You should plan to take it easy for the rest of today and you should NOT DRIVE or use heavy machinery until tomorrow (because of the sedation medicines used during the test).    FOLLOW UP: Our staff will call the number listed on your records the next business day following your procedure.  We will call around 7:15- 8:00 am to check on you and address any questions or concerns that you may have regarding the information given to you following your procedure. If we do not reach you, we will leave a message.  If you develop any symptoms (ie: fever, flu-like symptoms, shortness of breath, cough etc.) before then, please call [phone].  If you test positive for Covid 19 in the 2 weeks post procedure, please call and report this information to [place].    If any biopsies were taken you will be contacted by phone or by letter within the next 1-3 weeks.  Please call us at [phone] if you have not heard about the biopsies in 3 weeks.    SIGNATURES/CONFIDENTIALITY: You and/or your care partner have signed paperwork which will be entered into  your electronic medical record.  These signatures attest to the fact that that the information above on your After Visit Summary has been reviewed and is understood.  Full responsibility of the confidentiality of this discharge information lies with you and/or your care-partner.

## 2022-06-19 NOTE — Progress Notes (Signed)
To pacu, VSS. Report to [email or web address] 

## 2022-06-19 NOTE — Telephone Encounter (Signed)
Patient called this morning stating she was supposed to be here for her procedure at 9:30; however, she is still going to the bathroom and is "scared to leave her house."  Please call patient and advise.  Thank you.

## 2022-06-19 NOTE — Progress Notes (Signed)
Called to room to assist during endoscopic procedure.  Patient ID and intended procedure confirmed with present staff. Received instructions for my participation in the procedure from the performing physician.  

## 2022-06-20 ENCOUNTER — Telehealth: Payer: Self-pay

## 2022-06-20 ENCOUNTER — Other Ambulatory Visit: Payer: Self-pay

## 2022-06-20 DIAGNOSIS — K639 Disease of intestine, unspecified: Secondary | ICD-10-CM

## 2022-06-20 NOTE — Telephone Encounter (Signed)
Order placed for CT of A/P, rad scheduling to contact pt regarding appt.

## 2022-06-20 NOTE — Telephone Encounter (Signed)
  Follow up Call-     06/19/2022   10:22 AM  Call back number  Post procedure Call Back phone  # [phone]  Permission to leave phone message Yes     Patient questions:  Do you have a fever, pain , or abdominal swelling? No. Pain Score  0 *  Have you tolerated food without any problems? Yes.    Have you been able to return to your normal activities? Yes.    Do you have any questions about your discharge instructions: Diet   No. Medications  No. Follow up visit  No.  Do you have questions or concerns about your Care? No.  Actions: * If pain score is 4 or above: No action needed, pain <4.

## 2022-06-27 ENCOUNTER — Ambulatory Visit (INDEPENDENT_AMBULATORY_CARE_PROVIDER_SITE_OTHER): Payer: 59 | Admitting: Family Medicine

## 2022-06-27 ENCOUNTER — Encounter: Payer: Self-pay | Admitting: Family Medicine

## 2022-06-27 VITALS — BP 132/90 | HR 90 | Temp 98.1°F | Ht 63.0 in | Wt 169.1 lb

## 2022-06-27 DIAGNOSIS — E876 Hypokalemia: Secondary | ICD-10-CM | POA: Diagnosis not present

## 2022-06-27 DIAGNOSIS — I10 Essential (primary) hypertension: Secondary | ICD-10-CM

## 2022-06-27 DIAGNOSIS — Z23 Encounter for immunization: Secondary | ICD-10-CM

## 2022-06-27 DIAGNOSIS — F39 Unspecified mood [affective] disorder: Secondary | ICD-10-CM

## 2022-06-27 MED ORDER — HYDROCHLOROTHIAZIDE 25 MG PO TABS: 25.0000 mg | ORAL_TABLET | Freq: Every day | ORAL | 1 refills | Status: DC

## 2022-06-27 MED ORDER — DESVENLAFAXINE SUCCINATE ER 100 MG PO TB24: 100.0000 mg | ORAL_TABLET | Freq: Every day | ORAL | 1 refills | Status: DC

## 2022-06-27 MED ORDER — POTASSIUM CHLORIDE ER 20 MEQ PO TBCR
20.0000 meq | EXTENDED_RELEASE_TABLET | Freq: Every day | ORAL | 5 refills | Status: DC
Start: 2022-06-27 — End: 2022-07-30

## 2022-06-27 NOTE — Progress Notes (Signed)
Established Patient Office Visit  Subjective   Patient ID: Ariel [name], female    DOB: Feb 24, 1961  Age: 61 y.o. MRN: [id number]  Chief Complaint  Patient presents with   Establish Care    Patient is here for transition of care visit.   Patient is reporting that her balance feels off a bit, pt states that sometimes she has to catch herself from falling, states that she feels sometimes that "she is present but not present" states that this is a new finding. States she feels like she is losing her focus.States that she takes trazodone at night to help her sleep, states she monitors her sleep but doesn't feel well rested. Patient denies any snoring, states that she wakes up to pee once, states that it takes her about 2 hours to fall asleep at night, states that her mind "never turns off". States that she does have a Dietitian". She was started on pristiq 50 mg daily after receiving the results of the [employer] testing. She reports she doesn't feel much of a difference in her anxiety/ depression symptoms. She denies any side effects of the medication. PHQ was performed and results were reviewed with the patient.  States that she used to have a therapist but hasn't seen them in a while, states she will be going back to therapy soon.   HTN -- BP in office performed and is well controlled. She reports no side effects to the medications, no chest pain, SOB, dizziness or headaches. She has a BP cuff at home and is checking her BP regularly, reports they are in the normal range.   Weight loss- pt states she went to the weight loss center and they gave her Qsymia, however the  pharmacist warned her that the medication could worsen her potassium level, she reports she takes 10 meq daily, she needs anew BMP to follow up on this. Pt is also on HCTZ which can lower potassium levels.    Patient Active Problem List   Diagnosis Date Noted   Hypokalemia 06/28/2022   Scar contracture 02/26/2022    Postoperative breast asymmetry 01/23/2022   S/P bilateral breast reduction 09/17/2020   Back pain 05/02/2020   Neck pain 05/02/2020   Symptomatic mammary hypertrophy 05/02/2020   Primary osteoarthritis of right knee 08/26/2019   Left thyroid nodule 03/12/2019   Right knee pain 12/01/2018   CTS (carpal tunnel syndrome) 01/22/2018   Mood disorder ([facility]) 01/22/2018   Hypertension 05/29/2017   Hyperlipidemia 05/29/2017   Hyperglycemia 05/29/2017   Aortic atherosclerosis ([facility]) 05/29/2017   Goiter 04/24/2017      Review of Systems  All other systems reviewed and are negative.     Objective:     BP (!) 132/90 (BP Location: Left Arm, Patient Position: Sitting, Cuff Size: Normal)   Pulse 90   Temp 98.1 F (36.7 C) (Oral)   Ht '5\' 3"'$  (1.6 m)   Wt 169 lb 1.6 oz (76.7 kg)   LMP 06/29/2017   SpO2 100%   BMI 29.95 kg/m  BP Readings from Last 3 Encounters:  06/27/22 (!) 132/90  06/19/22 138/86  04/03/22 124/72      Physical Exam Vitals reviewed.  Constitutional:      Appearance: Normal appearance. She is well-groomed and normal weight.  Eyes:     Conjunctiva/sclera: Conjunctivae normal.  Neck:     Thyroid: No thyromegaly.  Cardiovascular:     Rate and Rhythm: Normal rate and regular rhythm.  Pulses: Normal pulses.     Heart sounds: S1 normal and S2 normal.  Pulmonary:     Effort: Pulmonary effort is normal.     Breath sounds: Normal breath sounds and air entry.  Abdominal:     General: Bowel sounds are normal.     Palpations: Abdomen is soft.  Musculoskeletal:     Cervical back: Neck supple.     Right lower leg: No edema.     Left lower leg: No edema.  Neurological:     Mental Status: She is alert and oriented to person, place, and time. Mental status is at baseline.     Gait: Gait is intact.  Psychiatric:        Mood and Affect: Mood and affect normal.        Speech: Speech normal.        Behavior: Behavior normal.        Judgment: Judgment normal.       No results found for any visits on 06/27/22.  Last metabolic panel Lab Results  Component Value Date   GLUCOSE 107 (H) 04/03/2022   NA 139 04/03/2022   K 2.8 (L) 04/03/2022   CL 101 04/03/2022   CO2 28 04/03/2022   BUN 12 04/03/2022   CREATININE 0.88 04/03/2022   GFRNONAA >60 04/03/2022   CALCIUM 9.6 04/03/2022   PROT 7.5 02/19/2022   ALBUMIN 4.4 02/19/2022   BILITOT 0.4 02/19/2022   ALKPHOS 114 02/19/2022   AST 26 02/19/2022   ALT 14 02/19/2022   ANIONGAP 10 04/03/2022   Last lipids Lab Results  Component Value Date   CHOL 190 02/19/2022   HDL 51.30 02/19/2022   LDLCALC 116 (H) 02/19/2022   TRIG 114.0 02/19/2022   CHOLHDL 4 02/19/2022   Last hemoglobin A1c Lab Results  Component Value Date   HGBA1C 5.8 02/19/2022      The 10-year ASCVD risk score (Arnett DK, et al., 2019) is: 7%    Assessment & Plan:   Problem List Items Addressed This Visit       Cardiovascular and Mediastinum   Hypertension - Primary    BP at goal today (<140) will continue her current medications listed below. HCTZ was refilled today for the patient. Current hypertension medications:       Sig   hydrochlorothiazide (HYDRODIURIL) 25 MG tablet Take 1 tablet (25 mg total) by mouth daily.   losartan (COZAAR) 25 MG tablet TAKE 1 TABLET (25 MG TOTAL) BY MOUTH DAILY.            Relevant Medications   hydrochlorothiazide (HYDRODIURIL) 25 MG tablet     Other   Mood disorder (HCC)    No improvement per the patient with the 50 mg pristiq, We discussed options and she would like a referral to psychiatry for further evaluation and management. I also recommend we increase her pristiq to 100 mg daily in the meantime and she is agreeable.       Relevant Medications   desvenlafaxine (PRISTIQ) 100 MG 24 hr tablet   Other Relevant Orders   Ambulatory referral to Psychiatry   Hypokalemia (Chronic)    Likely secondary to HCTZ use for her BP, she is on 10 meq daily, last level was low  on labs, I recommend we increase to 20 meq daily. I think it is ok to start the Qsymia and we will continue to monitor her potassium levels periodically.       Relevant Medications   potassium chloride  [place]   Other Relevant Orders   Basic Metabolic Panel   Other Visit Diagnoses     Immunization due       Relevant Orders   Flu Vaccine QUAD 6+ mos PF IM (Fluarix Quad PF) (Completed)       Return in about 3 months (around 09/26/2022) for follow up anxiety and sleep.    Farrel Conners, MD

## 2022-06-28 ENCOUNTER — Other Ambulatory Visit: Payer: Self-pay | Admitting: Family Medicine

## 2022-06-28 DIAGNOSIS — E876 Hypokalemia: Secondary | ICD-10-CM | POA: Insufficient documentation

## 2022-06-28 DIAGNOSIS — Z1231 Encounter for screening mammogram for malignant neoplasm of breast: Secondary | ICD-10-CM

## 2022-06-28 NOTE — Assessment & Plan Note (Addendum)
No improvement per the patient with the 50 mg pristiq, We discussed options and she would like a referral to psychiatry for further evaluation and management. I also recommend we increase her pristiq to 100 mg daily in the meantime and she is agreeable.

## 2022-06-28 NOTE — Assessment & Plan Note (Addendum)
BP at goal today (<140) will continue her current medications listed below. HCTZ was refilled today for the patient. Current hypertension medications:      Sig   hydrochlorothiazide (HYDRODIURIL) 25 MG tablet Take 1 tablet (25 mg total) by mouth daily.   losartan (COZAAR) 25 MG tablet TAKE 1 TABLET (25 MG TOTAL) BY MOUTH DAILY.

## 2022-06-28 NOTE — Assessment & Plan Note (Signed)
Likely secondary to HCTZ use for her BP, she is on 10 meq daily, last level was low on labs, I recommend we increase to 20 meq daily. I think it is ok to start the Qsymia and we will continue to monitor her potassium levels periodically.

## 2022-07-01 ENCOUNTER — Encounter: Payer: Self-pay | Admitting: Family Medicine

## 2022-07-02 NOTE — Telephone Encounter (Signed)
Ok to write letter for the patient

## 2022-07-03 ENCOUNTER — Encounter (HOSPITAL_COMMUNITY): Payer: Self-pay

## 2022-07-03 ENCOUNTER — Ambulatory Visit (HOSPITAL_COMMUNITY)
Admission: RE | Admit: 2022-07-03 | Discharge: 2022-07-03 | Disposition: A | Discharge status: Home / Self Care | Payer: 59 | Source: Ambulatory Visit | Attending: Internal Medicine | Admitting: Internal Medicine

## 2022-07-03 DIAGNOSIS — K639 Disease of intestine, unspecified: Secondary | ICD-10-CM | POA: Diagnosis present

## 2022-07-03 MED ORDER — IOHEXOL 300 MG/ML  SOLN
100.0000 mL | Freq: Once | INTRAMUSCULAR | Status: AC | PRN
  Administered 2022-07-03: 100 mL via INTRAVENOUS

## 2022-07-03 MED ORDER — SODIUM CHLORIDE (PF) 0.9 % IJ SOLN
INTRAMUSCULAR | Status: AC
  Filled 2022-07-03: qty 50

## 2022-07-05 NOTE — Progress Notes (Signed)
I spoke to patient regarding pathology results and diagnosis of NET and a rectal polyp.  Margin not definitively clear. Also spoke to her regarding recent CT abdomen pelvis which shows the submucosal sigmoid density seen at colonoscopy.  Difficult to determine if this is lipomatous or possibly NET. I have recommended outpatient colonoscopy and EUS with Dr. Rush Landmark. I did gust this with the patient including the risks and benefits and she wishes to proceed.  Ariel [name] please arrange outpatient colonoscopy/EUS with Dr. Rush Landmark in the outpatient hospital space  Thanks to all for the assistance  No recall needed at this time for colonoscopy as we will base this on her exam with Dr. Rush Landmark No pathology letter needed

## 2022-07-08 ENCOUNTER — Telehealth: Payer: Self-pay | Admitting: *Deleted

## 2022-07-08 ENCOUNTER — Other Ambulatory Visit: Payer: Self-pay

## 2022-07-08 DIAGNOSIS — K639 Disease of intestine, unspecified: Secondary | ICD-10-CM

## 2022-07-08 DIAGNOSIS — K621 Rectal polyp: Secondary | ICD-10-CM

## 2022-07-08 MED ORDER — TRAZODONE HCL 50 MG PO TABS: 25.0000 mg | ORAL_TABLET | Freq: Every evening | ORAL | 0 refills | Status: AC | PRN

## 2022-07-08 NOTE — Telephone Encounter (Signed)
Ok to refill 

## 2022-07-08 NOTE — Telephone Encounter (Signed)
[facility] faxed a refill request for Trazodone '50mg'$ .  Message sent to PCP.

## 2022-07-08 NOTE — Telephone Encounter (Signed)
Rx done. 

## 2022-07-11 ENCOUNTER — Other Ambulatory Visit: Payer: 59

## 2022-07-18 ENCOUNTER — Ambulatory Visit: Payer: 59

## 2022-07-26 ENCOUNTER — Ambulatory Visit: Payer: 59 | Admitting: Endocrinology

## 2022-07-30 ENCOUNTER — Other Ambulatory Visit: Payer: Self-pay | Admitting: Family Medicine

## 2022-07-30 DIAGNOSIS — E876 Hypokalemia: Secondary | ICD-10-CM

## 2022-08-01 ENCOUNTER — Institutional Professional Consult (permissible substitution): Payer: 59 | Admitting: Surgical

## 2022-08-06 ENCOUNTER — Encounter: Payer: Self-pay | Admitting: Surgical

## 2022-08-06 ENCOUNTER — Ambulatory Visit (INDEPENDENT_AMBULATORY_CARE_PROVIDER_SITE_OTHER): Payer: 59 | Admitting: Surgical

## 2022-08-06 VITALS — BP 141/86 | HR 80

## 2022-08-06 DIAGNOSIS — N6489 Other specified disorders of breast: Secondary | ICD-10-CM

## 2022-08-06 DIAGNOSIS — Z9889 Other specified postprocedural states: Secondary | ICD-10-CM

## 2022-08-06 DIAGNOSIS — L905 Scar conditions and fibrosis of skin: Secondary | ICD-10-CM

## 2022-08-06 DIAGNOSIS — S21001D Unspecified open wound of right breast, subsequent encounter: Secondary | ICD-10-CM

## 2022-08-06 NOTE — Progress Notes (Signed)
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  JUSTIFICATION FOR PROCEDURE:  Ariel [name] is a 61 y.o. female with a history of bilateral breast reduction.  Unfortunately she developed some nipple areolar necrosis on the right side, the area has subsequently healed but she does have some nipple areola asymmetry which she is bothered by. Risks, benefits, indications, and alternatives of the above described procedures were discussed with the patient and all the patient's questions were answered. Consent was signed.  DESCRIPTION OF PROCEDURE: After informed consent was obtained and proper identification of patient and surgical site was made, the patient was taken to the procedure room and resting in procedure chair. The patient was prepped and draped in the usual sterile fashion. Attention was turned to the selection of flesh colored permanent tattoo ink to produce the appropriate color for nipple areolar complex tattooing. Color, size and location was confirmed with the patient. Color was used to match the existing left nipple areola. Pre-op photos were obtained and placed in patient chart with patient consent. Using a Digital Revo 7R pronged tattoo head, pigment was instilled to the designed nipple areolar complex, which was confirmed preoperatively with the patient. Once adequate pigment had been applied to the nipple areolar complex, vaseline followed by an occlusive dressing was applied. The patient tolerated the procedure well. There were no complications  Patient was provided guidance in regards to dressing changes and restrictions.  The area of tattoo is: 9 cm  A 4:1 ratio of Warm Mink and Dark Tan was used to create the desired color. 4 part Warm Mink -  Exp  10/2024  1 part Dark Tan -  Exp 10/2024   I would like the patient to follow-up virtually in 3 weeks to discuss nipple areola coloring, I would then like to schedule an appointment with her in 5 weeks for repeat nipple areola tattoo.

## 2022-08-08 ENCOUNTER — Encounter (HOSPITAL_COMMUNITY): Payer: Self-pay | Admitting: Psychiatry

## 2022-08-08 ENCOUNTER — Telehealth (HOSPITAL_BASED_OUTPATIENT_CLINIC_OR_DEPARTMENT_OTHER): Payer: 59 | Admitting: Psychiatry

## 2022-08-08 ENCOUNTER — Encounter: Payer: Self-pay | Admitting: Family Medicine

## 2022-08-08 ENCOUNTER — Encounter (HOSPITAL_COMMUNITY): Payer: Self-pay

## 2022-08-08 DIAGNOSIS — F39 Unspecified mood [affective] disorder: Secondary | ICD-10-CM | POA: Diagnosis not present

## 2022-08-08 MED ORDER — DESVENLAFAXINE SUCCINATE ER 50 MG PO TB24: 50.0000 mg | ORAL_TABLET | Freq: Every day | ORAL | 0 refills | Status: DC

## 2022-08-08 MED ORDER — DESVENLAFAXINE SUCCINATE ER 25 MG PO TB24: 25.0000 mg | ORAL_TABLET | Freq: Every day | ORAL | 0 refills | Status: DC

## 2022-08-08 MED ORDER — LAMOTRIGINE 25 MG PO TABS: ORAL_TABLET | ORAL | 0 refills | Status: DC

## 2022-08-08 NOTE — Progress Notes (Signed)
Psychiatric Initial Adult Assessment   Patient Identification: Ariel Ariel [name] MRN:  [id number] Date of Evaluation:  08/08/2022 Referral Source: PCP Chief Complaint:   Chief Complaint  Patient Ariel [name] with   Depression   Establish Care   Visit Diagnosis:    ICD-10-CM   1. Mood disorder (HCC)  F39 desvenlafaxine (PRISTIQ) 50 MG 24 hr tablet    desvenlafaxine (PRISTIQ) 25 MG 24 hr tablet    lamoTRIgine (LAMICTAL) 25 MG tablet       Assessment:  Ariel Ariel [name] is a 61 y.o. y.o. female with a history of mood disorder who Ariel [name] virtually to [place] at [facility] for initial evaluation on 08/08/22.    Patient reports neurovegetative symptoms of depression including low mood, anhedonia, amotivation, increased fatigue, difficulty sleeping, and increased anxiety.  She denies any SI/HI or thoughts of self-harm along with any history of mania, psychosis, delusions, or paranoia.  Patient also endorses symptoms of mood lability and increased irritability she has struggled with for an extended period of time.  At this time patient meets criteria for unspecified mood disorder with a direct differential being MDD versus cyclothymia.  She has tried antidepressants in the past with minimal benefit.  We will start patient on Lamictal to better manage mood lability symptoms and irritability.  Patient also benefit from being connected with a therapist in addition to continuing with her wellness coach.  A number of assessments were performed during the evaluation today including nutritional assessment which was 0, pain assessment which showed 8/10 back pain secondary to chronic knee pain is managed with corticosteroid injections, PHQ-9 which she scored a 15 on, and [place] suicide severity screening which showed no risk.  Based on these assessments patient would benefit from medication adjustment to better target her symptoms.  Plan: - Taper Prystiq to 50 mg for 2 weeks  before decreasing to 25 mg QD - Start Lamictal 25 mg QD and increase to 50 mg QD in 2 weeks - Continue Trazodone 25-50 mg QHS - CBC, CMP, A1c,  VIT D, and TSH reviewed - Therapy referral - Continue with wellness coach - Crisis resources discussed - Follow up in a month - Patient to bring in genesite testing  History of Present Illness: Ariel Ariel [name] reporting that she suffers from anxiety and depression that seemed to start around 2020.  She feels that the isolation of COVID led to her isolating in turn and making it harder for her to get out once things return to normal.  Patient described herself as formerly being very social and outgoing enjoying spending time with others.  However now she finds that it is difficult to get herself to do anything.  She describes going to work 5 days a week which is stressful but she enjoys.  Then after work she will just feel like going home, keeping to herself, and not doing anything.  On the weekend she will do the same and not engage with others like she once did.  Patient would like to get back to how she was in the past and has tried a few different medications without much success.  She was started first on Zoloft she was titrated before being later changed to [place] after gene psych testing.  Pristiq has been titrated up to 100 mg but patient reports no noticeable difference in her mood.  She reports connecting with a wellness coach around a month and a half ago with some improvement.  They have been working on  grounding techniques and breathing strategies in addition to holding oneself accountable.  Patient reports that she is presenting today to get further clarity and ideas on how to better improve her mood.  We discussed her current symptoms and she notes constant fluctuations in her mood.  She can go from feeling good and happy, to irritable, too anxious, to depressed.  She notes that she can have a short fuse and depending on how she is feeling can lash  out verbally and very sarcastic ways.  Patient also endorses increased episodes of tearfulness which is not normal for her for example she has started to cry while watching movies which she has never done in the past.  Her sleep has been problematic where she will often lie in bed for 3 to 4 hours before falling asleep and then wake up feeling fatigued.  She was started on trazodone and finds that it does help improve her sleep and her feeling fatigued in the morning, however if she takes it too late she also feels hung over the next day.  We discussed treatment options including therapy, continuing with wellness coach, and medication options.  As patient had genesite testing in the past we asked her to get a copy for her review which she was open to.  She also expressed being open to restarting therapy.  She had tried it in the past but had stopped after feeling that she was not noticing any benefit.  Regarding medications we discussed trying a mood stabilizer and she has never tried anything in this class before and she describes rapid fluctuations in her mood.  Patient was open to this and we discussed the risk and benefits of Lamictal.  Associated Signs/Symptoms: Depression Symptoms:  anhedonia, insomnia, fatigue, feelings of worthlessness/guilt, anxiety, loss of energy/fatigue, disturbed sleep, weight gain, (Hypo) Manic Symptoms:   Denies Anxiety Symptoms:   Denies Psychotic Symptoms:   Denies PTSD Symptoms: NA  Past Psychiatric History: Patient reports that she used to go to therapy in the past but stopped due to feelings of lack of benefit.  She has been tried on a couple psychotropic medications by her PCP including Wellbutrin which she is allergic to, Zoloft which had minimal effect, Pristiq which had minimal effect, and trazodone which works well for sleep.  She denies any prior suicide attempts or thoughts of self-harm and she denies any psychiatric hospitalizations.  Eats and edible  every now and then for help with pain.  She denies any other substance use.   Previous Psychotropic Medications: Yes   Substance Abuse History in the last 12 months:  No.  Consequences of Substance Abuse: NA  Past Medical History:  Past Medical History:  Diagnosis Date   Anxiety    Chronic bronchitis (HCC)    Depression    Dyspnea    occasional with exertion   Fibroids, intramural    Goiter    History of blood transfusion 1964   Hyperlipidemia    Hypertension    Hypothyroidism    Open wound of right breast 10/03/2020   Osteopenia 01/2018   T score -1.3 FRAX 3.8% / 0.5%   Thyroid disease    Tobacco use 05/29/2017    Past Surgical History:  Procedure Laterality Date   ABDOMINAL HYSTERECTOMY     07/2017-Dr [place] per patient; done for fibroids   BREAST REDUCTION SURGERY Bilateral 08/03/2020   Procedure: BREAST REDUCTION WITH LIPOSUCTION;  Surgeon: Wallace Going, DO;  Location: [place];  Service: Clinical cytogeneticist;  Laterality: Bilateral;  3.5 hours   BUNIONECTOMY Right    CARDIAC ELECTROPHYSIOLOGY STUDY AND ABLATION  2010   hx tachycardia    LAPAROTOMY N/A 06/26/2017   Procedure: EXPLORATORY LAPAROTOMY;  Surgeon: Everitt Amber, MD;  Location: [facility];  Service: Gynecology;  Laterality: N/A;   LESION EXCISION WITH COMPLEX REPAIR Right 04/03/2022   Procedure: Scar contracture release right breast with fat grafting;  Surgeon: Wallace Going, DO;  Location: [place];  Service: Plastics;  Laterality: Right;   LIPOSUCTION WITH LIPOFILLING Bilateral 04/03/2022   Procedure: LIPOSUCTION WITH LIPOFILLING;  Surgeon: Wallace Going, DO;  Location: [place];  Service: Plastics;  Laterality: Bilateral;   THYROID LOBECTOMY Left 06/18/2019   Procedure: LEFT THYROID LOBECTOMY;  Surgeon: Armandina Gemma, MD;  Location: [facility];  Service: General;  Laterality: Left;   TONSILLECTOMY  1967   TUBAL LIGATION     X2, REVERSAL     Family Psychiatric History: Patient  denies any family psychiatric history  Family History:  Family History  Problem Relation Age of Onset   Hypertension Mother    Renal Disease Mother    Diabetes Father    Hypertension Father    Renal Disease Father        on dialysis   Thyroid disease Maternal Aunt    Stroke Maternal Grandmother    Breast cancer Maternal Grandmother        diagnosed in her 72's   Cancer Paternal Grandmother        ?   Healthy Daughter    Healthy Son    Colon polyps Neg Hx    Colon cancer Neg Hx    Esophageal cancer Neg Hx    Rectal cancer Neg Hx    Stomach cancer Neg Hx     Social History:   Social History   Socioeconomic History   Marital status: Single    Spouse name: Not on file   Number of children: Not on file   Years of education: Not on file   Highest education level: Not on file  Occupational History   Not on file  Tobacco Use   Smoking status: Former    Packs/day: 0.25    Years: 30.00    Total pack years: 7.50    Types: Cigarettes    Quit date: 06/17/2019    Years since quitting: 3.1   Smokeless tobacco: Never  Vaping Use   Vaping Use: Never used  Substance and Sexual Activity   Alcohol use: Not Currently    Comment: OCC   Drug use: Yes    Types: Marijuana    Comment: Occas - last use 03/21/22   Sexual activity: Yes    Partners: Male    Comment: 1ST INTERCOURSE- 23, PARTNERS- 49, CURRENT PARTNER- 3.5 YRS   Other Topics Concern   Not on file  Social History Narrative   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 Christian, no church currenty      Lifestyle: "poor diet"; no exercise   Social Determinants of Radio broadcast assistant Strain: Not on file  Food Insecurity: Not on file  Transportation Needs: Not on file  Physical Activity: Not on file  Stress: Not on file  Social Connections: Not on file    Additional Social History: Patient is separated from her partner with the 2 of them living  in separate places.  She has 2 kids a son and a daughter neither who live in the area and a grandson who is 22 and lives with her.  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  In the past she liked to socialize with others however has become more withdrawn.  Currently she finds enjoyment in coloring.  Allergies:   Allergies  Allergen Reactions   Wellbutrin [Bupropion] Other (See Comments)    Made her sick    Metabolic Disorder Labs: Lab Results  Component Value Date   HGBA1C 5.8 02/19/2022   No results found for: "PROLACTIN" Lab Results  Component Value Date   CHOL 190 02/19/2022   TRIG 114.0 02/19/2022   HDL 51.30 02/19/2022   CHOLHDL 4 02/19/2022   VLDL 22.8 02/19/2022   LDLCALC 116 (H) 02/19/2022   LDLCALC 121 (H) 08/15/2021   Lab Results  Component Value Date   TSH 2.14 02/19/2022    Therapeutic Level Labs: No results found for: "LITHIUM" No results found for: "CBMZ" No results found for: "VALPROATE"  Current Medications: Current Outpatient Medications  Medication Sig Dispense Refill   desvenlafaxine (PRISTIQ) 50 MG 24 hr tablet Take 1 tablet (50 mg total) by mouth daily. Take 50 mg for 14 days before decreasing to 25 mg for 14 days 14 tablet 0   lamoTRIgine (LAMICTAL) 25 MG tablet Take 1 tablet (25 mg total) by mouth daily for 14 days, THEN 2 tablets (50 mg total) daily for 16 days. 50 tablet 0   albuterol (VENTOLIN HFA) 108 (90 Base) MCG/ACT inhaler TAKE 2 PUFFS BY MOUTH EVERY 6 HOURS AS NEEDED FOR WHEEZE OR SHORTNESS OF BREATH 6.7 each 2   atorvastatin (LIPITOR) 20 MG tablet Take 1 tablet (20 mg total) by mouth daily. 90 tablet 1   desvenlafaxine (PRISTIQ) 25 MG 24 hr tablet Take 1 tablet (25 mg total) by mouth daily. Start on 11/3 after completing 14 day script of Viibryd 50 mg 14 tablet 0   hydrochlorothiazide (HYDRODIURIL) 25 MG tablet Take 1 tablet (25 mg total) by mouth daily. 90 tablet 1   levothyroxine  (SYNTHROID) 50 MCG tablet TAKE 1 TABLET BY MOUTH EVERY DAY 90 tablet 1   losartan (COZAAR) 25 MG tablet TAKE 1 TABLET (25 MG TOTAL) BY MOUTH DAILY. 90 tablet 1   meloxicam (MOBIC) 15 MG tablet Take 1 tablet by mouth daily.     Potassium Chloride ER 20 MEQ TBCR TAKE 20 MEQ BY MOUTH DAILY. 90 tablet 2   Spacer/Aero-Holding Chambers DEVI 1 Device by Does not apply route daily as needed. 1 each 0   traZODone (DESYREL) 50 MG tablet Take 0.5-1 tablets (25-50 mg total) by mouth at bedtime as needed. 30 tablet 0   triamcinolone cream (KENALOG) 0.1 % Apply to affected area twice daily for 2 weeks time. 30 g 2   Current Facility-Administered Medications  Medication Dose Route Frequency Provider Last Rate Last Admin   0.9 %  sodium chloride infusion  500 mL Intravenous Once Pyrtle, Lajuan Lines, MD        Psychiatric Specialty Exam: Review of Systems  Last menstrual period 06/29/2017.There is no height or weight on file to calculate BMI.  General Appearance: Well Groomed  Eye Contact:  Good  Speech:  Clear and Coherent and Normal Rate  Volume:  Increased  Mood:  Anxious and Irritable  Affect:  Congruent  Thought Process:  Coherent, Goal Directed, and Linear  Orientation:  Full (  Time, Place, and Person)  Thought Content:  Logical  Suicidal Thoughts:  No  Homicidal Thoughts:  No  Memory:  NA  Judgement:  Good  Insight:  Fair  Psychomotor Activity:  Normal  Concentration:  Concentration: Good  Recall:  Good  Fund of Knowledge:Good  Language: Good  Akathisia:  NA    AIMS (if indicated):  not done  Assets:  Communication Skills Desire for Improvement Housing Resilience Talents/Skills Transportation Vocational/Educational  ADL's:  Intact  Cognition: WNL  Sleep:  Fair   Screenings: PHQ2-9    Flowsheet Row Video Visit from 08/08/2022 in [facility] Office Visit from 06/27/2022 in [place] at [place] from 02/13/2022 in [place] at [place] from 08/15/2021 in [place] at [facility] from 01/05/2021 in [place] at [facility] Total Score '3 3 3 5 6  '$ PHQ-9 Total Score '15 16 17 '$ -- 19      Flowsheet Row Video Visit from 08/08/2022 in [facility] Admission (Discharged) from 04/03/2022 in [place] No Risk No Risk        Collaboration of Care: Medication Management AEB medication prescription and Primary Care Provider AEB chart review  Patient/Guardian was advised Release of Information must be obtained prior to any record release in order to collaborate their care with an outside provider. Patient/Guardian was advised if they have not already done so to contact the registration department to sign all necessary forms in order for [place] to release information regarding their care.   Consent: Patient/Guardian gives verbal consent for treatment and assignment of benefits for services provided during this visit. Patient/Guardian expressed understanding and agreed to proceed.   Vista Mink, MD 10/19/20233:46 PM    Virtual Visit via Video Note  I connected with Anabeth Chilcott on 08/08/22 at  2:00 PM EDT by a video enabled telemedicine application and verified that I am speaking with the correct person using two identifiers.  Location: Patient: Home Provider: Home office   I discussed the limitations of evaluation and management by telemedicine and the availability of in person appointments. The patient expressed understanding and agreed to proceed.   I discussed the assessment and treatment plan with the patient. The patient was provided an opportunity to ask questions and all were answered. The patient agreed with the plan and demonstrated an understanding of the instructions.   The patient was advised to call back or seek an in-person evaluation if the symptoms worsen or if  the condition fails to improve as anticipated.  I provided 45 minutes of non-face-to-face time during this encounter.   Vista Mink, MD

## 2022-08-16 ENCOUNTER — Encounter (HOSPITAL_COMMUNITY): Payer: Self-pay | Admitting: Gastroenterology

## 2022-08-19 ENCOUNTER — Encounter (HOSPITAL_COMMUNITY): Payer: Self-pay | Admitting: Gastroenterology

## 2022-08-20 ENCOUNTER — Encounter (HOSPITAL_COMMUNITY): Payer: Self-pay | Admitting: Gastroenterology

## 2022-08-20 ENCOUNTER — Other Ambulatory Visit: Payer: Self-pay

## 2022-08-26 ENCOUNTER — Encounter (HOSPITAL_COMMUNITY)
Admission: RE | Disposition: A | Discharge status: Home / Self Care | Payer: Self-pay | Source: Home / Self Care | Attending: Gastroenterology

## 2022-08-26 ENCOUNTER — Encounter (HOSPITAL_COMMUNITY): Payer: Self-pay | Admitting: Gastroenterology

## 2022-08-26 ENCOUNTER — Other Ambulatory Visit: Payer: Self-pay

## 2022-08-26 ENCOUNTER — Ambulatory Visit (HOSPITAL_BASED_OUTPATIENT_CLINIC_OR_DEPARTMENT_OTHER): Payer: 59 | Admitting: Anesthesiology

## 2022-08-26 ENCOUNTER — Ambulatory Visit (HOSPITAL_COMMUNITY): Payer: 59 | Admitting: Anesthesiology

## 2022-08-26 ENCOUNTER — Ambulatory Visit (HOSPITAL_COMMUNITY)
Admission: RE | Admit: 2022-08-26 | Discharge: 2022-08-26 | Disposition: A | Discharge status: Home / Self Care | Payer: 59 | Attending: Gastroenterology | Admitting: Gastroenterology

## 2022-08-26 DIAGNOSIS — K6389 Other specified diseases of intestine: Secondary | ICD-10-CM

## 2022-08-26 DIAGNOSIS — E039 Hypothyroidism, unspecified: Secondary | ICD-10-CM | POA: Diagnosis not present

## 2022-08-26 DIAGNOSIS — I10 Essential (primary) hypertension: Secondary | ICD-10-CM | POA: Diagnosis not present

## 2022-08-26 DIAGNOSIS — F32A Depression, unspecified: Secondary | ICD-10-CM | POA: Diagnosis not present

## 2022-08-26 DIAGNOSIS — K639 Disease of intestine, unspecified: Secondary | ICD-10-CM

## 2022-08-26 DIAGNOSIS — K640 First degree hemorrhoids: Secondary | ICD-10-CM

## 2022-08-26 DIAGNOSIS — I899 Noninfective disorder of lymphatic vessels and lymph nodes, unspecified: Secondary | ICD-10-CM | POA: Diagnosis not present

## 2022-08-26 DIAGNOSIS — Z87891 Personal history of nicotine dependence: Secondary | ICD-10-CM | POA: Insufficient documentation

## 2022-08-26 DIAGNOSIS — K5730 Diverticulosis of large intestine without perforation or abscess without bleeding: Secondary | ICD-10-CM | POA: Insufficient documentation

## 2022-08-26 DIAGNOSIS — D175 Benign lipomatous neoplasm of intra-abdominal organs: Secondary | ICD-10-CM | POA: Insufficient documentation

## 2022-08-26 DIAGNOSIS — Q438 Other specified congenital malformations of intestine: Secondary | ICD-10-CM

## 2022-08-26 DIAGNOSIS — F419 Anxiety disorder, unspecified: Secondary | ICD-10-CM | POA: Insufficient documentation

## 2022-08-26 DIAGNOSIS — M1990 Unspecified osteoarthritis, unspecified site: Secondary | ICD-10-CM | POA: Diagnosis not present

## 2022-08-26 DIAGNOSIS — K621 Rectal polyp: Secondary | ICD-10-CM

## 2022-08-26 HISTORY — PX: EUS: SHX5427

## 2022-08-26 HISTORY — PX: FLEXIBLE SIGMOIDOSCOPY: SHX5431

## 2022-08-26 HISTORY — DX: Pneumonia, unspecified organism: J18.9

## 2022-08-26 LAB — POCT I-STAT, CHEM 8
BUN: 7 mg/dL (ref 6–20)
Calcium, Ion: 1.18 mmol/L (ref 1.15–1.40)
Chloride: 103 mmol/L (ref 98–111)
Creatinine, Ser: 0.8 mg/dL (ref 0.44–1.00)
Glucose, Bld: 86 mg/dL (ref 70–99)
HCT: 30 % — ABNORMAL LOW (ref 36.0–46.0)
Hemoglobin: 10.2 g/dL — ABNORMAL LOW (ref 12.0–15.0)
Potassium: 3.4 mmol/L — ABNORMAL LOW (ref 3.5–5.1)
Sodium: 140 mmol/L (ref 135–145)
TCO2: 25 mmol/L (ref 22–32)

## 2022-08-26 SURGERY — ULTRASOUND, LOWER GI TRACT, ENDOSCOPIC
Anesthesia: Monitor Anesthesia Care

## 2022-08-26 MED ORDER — PROPOFOL 500 MG/50ML IV EMUL
INTRAVENOUS | Status: DC | PRN
  Administered 2022-08-26: 100 ug/kg/min via INTRAVENOUS

## 2022-08-26 MED ORDER — SODIUM CHLORIDE 0.9 % IV SOLN: INTRAVENOUS | Status: DC

## 2022-08-26 MED ORDER — PROPOFOL 10 MG/ML IV BOLUS
INTRAVENOUS | Status: DC | PRN
  Administered 2022-08-26: 20 mg via INTRAVENOUS
  Administered 2022-08-26 (×2): 30 mg via INTRAVENOUS

## 2022-08-26 MED ORDER — LACTATED RINGERS IV SOLN: INTRAVENOUS | Status: DC

## 2022-08-26 MED ORDER — PROPOFOL 500 MG/50ML IV EMUL
INTRAVENOUS | Status: AC
  Filled 2022-08-26: qty 50

## 2022-08-26 MED ORDER — LIDOCAINE 2% (20 MG/ML) 5 ML SYRINGE
INTRAMUSCULAR | Status: DC | PRN
  Administered 2022-08-26: 60 mg via INTRAVENOUS

## 2022-08-26 NOTE — H&P (Signed)
GASTROENTEROLOGY PROCEDURE H&P NOTE   Primary Care Physician: Farrel Conners, MD  HPI: Ariel [name] is a 61 y.o. 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  Past Medical History:  Diagnosis Date   Anxiety    Chronic bronchitis (HCC)    Depression    Dyspnea    occasional with exertion   Fibroids, intramural    Goiter    History of blood transfusion 1964   Hyperlipidemia    Hypertension    Hypothyroidism    Open wound of right breast 10/03/2020   Osteopenia 01/2018   T score -1.3 FRAX 3.8% / 0.5%   Pneumonia    Thyroid disease    Tobacco use 05/29/2017   Past Surgical History:  Procedure Laterality Date   ABDOMINAL HYSTERECTOMY     07/2017-Dr [place] per patient; done for fibroids   BREAST REDUCTION SURGERY Bilateral 08/03/2020   Procedure: BREAST REDUCTION WITH LIPOSUCTION;  Surgeon: Wallace Going, DO;  Location: [place];  Service: Plastics;  Laterality: Bilateral;  3.5 hours   BUNIONECTOMY Right    CARDIAC ELECTROPHYSIOLOGY STUDY AND ABLATION  2010   hx tachycardia    LAPAROTOMY N/A 06/26/2017   Procedure: EXPLORATORY LAPAROTOMY;  Surgeon: Everitt Amber, MD;  Location: [facility];  Service: Gynecology;  Laterality: N/A;   LESION EXCISION WITH COMPLEX REPAIR Right 04/03/2022   Procedure: Scar contracture release right breast with fat grafting;  Surgeon: Wallace Going, DO;  Location: [place];  Service: Plastics;  Laterality: Right;   LIPOSUCTION WITH LIPOFILLING Bilateral 04/03/2022   Procedure: LIPOSUCTION WITH LIPOFILLING;  Surgeon: Wallace Going, DO;  Location: [place];  Service: Plastics;  Laterality: Bilateral;   THYROID LOBECTOMY Left 06/18/2019   Procedure: LEFT THYROID LOBECTOMY;  Surgeon: Armandina Gemma, MD;  Location[facility];  Service: General;   Laterality: Left;   TONSILLECTOMY  1967   TUBAL LIGATION     X2,1 REVERSAL   Current Facility-Administered Medications  Medication Dose Route Frequency Provider Last Rate Last Admin   0.9 %  sodium chloride infusion   Intravenous Continuous Mansouraty, Telford Nab., MD       lactated ringers infusion   Intravenous Continuous Mansouraty, Telford Nab., MD 10 mL/hr at 08/26/22 1239 New Bag at 08/26/22 1239    Current Facility-Administered Medications:    0.9 %  sodium chloride infusion, , Intravenous, Continuous, Mansouraty, Telford Nab., MD   lactated ringers infusion, , Intravenous, Continuous, Mansouraty, Telford Nab., MD, Last Rate: 10 mL/hr at 08/26/22 1239, New Bag at 08/26/22 1239 Allergies  Allergen Reactions   Qsymia [Phentermine-Topiramate] Nausea And Vomiting   Wellbutrin [Bupropion] Other (See Comments)    Made her sick   Family History  Problem Relation Age of Onset   Hypertension Mother    Renal Disease Mother    Diabetes Father    Hypertension Father    Renal Disease Father        on dialysis   Thyroid disease Maternal Aunt    Stroke Maternal Grandmother    Breast cancer Maternal Grandmother        diagnosed in her 27's   Cancer Paternal Grandmother        ?   Healthy Daughter    Healthy Son    Colon polyps Neg Hx    Colon cancer Neg Hx    Esophageal cancer Neg  Hx    Rectal cancer Neg Hx    Stomach cancer Neg Hx    Social History   Socioeconomic History   Marital status: Single    Spouse name: Not on file   Number of children: Not on file   Years of education: Not on file   Highest education level: Not on file  Occupational History   Not on file  Tobacco Use   Smoking status: Former    Packs/day: 0.25    Years: 30.00    Total pack years: 7.50    Types: Cigarettes    Quit date: 06/17/2019    Years since quitting: 3.1   Smokeless tobacco: Never  Vaping Use   Vaping Use: Never used  Substance and Sexual Activity   Alcohol use: Not Currently     Comment: 1-2 x a month   Drug use: Yes    Types: Marijuana    Comment: Occas - last use 03/21/22  occasional edible   Sexual activity: Yes    Partners: Male    Comment: 1ST INTERCOURSE- 89, PARTNERS- 36, CURRENT PARTNER- 3.5 YRS   Other Topics Concern   Not on file  Social History Narrative   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 Christian, no church currenty      Lifestyle: "poor diet"; no exercise   Social Determinants of Radio broadcast assistant Strain: Not on file  Food Insecurity: Not on file  Transportation Needs: Not on file  Physical Activity: Not on file  Stress: Not on file  Social Connections: Not on file  Intimate Partner Violence: Not on file    Physical Exam: Today's Vitals   08/26/22 1232  BP: 125/88  Pulse: 72  Resp: 12  Temp: 98.2 F (36.8 C)  TempSrc: Oral  SpO2: 98%  Weight: 74.8 kg  Height: '5\' 3"'$  (1.6 m)  PainSc: 0-No pain   Body mass index is 29.23 kg/m. GEN: NAD EYE: Sclerae anicteric ENT: MMM CV: Non-tachycardic GI: Soft, NT/ND NEURO:  Alert & Oriented x 3  Lab Results: Recent Labs    08/26/22 1302  HGB 10.2*  HCT 30.0*   BMET Recent Labs    08/26/22 1302  NA 140  K 3.4*  CL 103  GLUCOSE 86  BUN 7  CREATININE 0.80   LFT No results for input(s): "PROT", "ALBUMIN", "AST", "ALT", "ALKPHOS", "BILITOT", "BILIDIR", "IBILI" in the last 72 hours. PT/INR No results for input(s): "LABPROT", "INR" in the last 72 hours.   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   The risks of an EUS including intestinal perforation, bleeding, infection, aspiration, and medication effects were discussed as was the possibility it may not give a definitive diagnosis if a  biopsy is performed.    The risks and benefits of endoscopic evaluation/treatment were discussed with the patient and/or family; these include but are not limited to the risk of perforation, infection, bleeding, missed lesions, lack of diagnosis, severe illness requiring hospitalization, as well as anesthesia and sedation related illnesses.  The patient's history has been reviewed, patient examined, no change in status, and deemed stable for procedure.  The patient and/or family is agreeable to proceed.    Justice Britain, MD [facility] Office #  [phone]  

## 2022-08-26 NOTE — Anesthesia Procedure Notes (Signed)
Procedure Name: MAC Date/Time: 08/26/2022 1:51 PM  Performed by: Lollie Sails, CRNAPre-anesthesia Checklist: Patient identified, Emergency Drugs available, Suction available, Patient being monitored and Timeout performed Oxygen Delivery Method: Simple face mask Placement Confirmation: positive ETCO2

## 2022-08-26 NOTE — Op Note (Signed)
[facility] Patient Name: Ariel [name] Procedure Date: 08/26/2022 MRN: [id number] Attending MD: Justice Britain , MD, [id number] Date of Birth: 1961/07/18 CSN: [id number] Age: 61 Admit Type: Outpatient Procedure:                Lower EUS Indications:              Sigmoid deformity found on endoscopy; subepithelial                            tumor versus extrinsic compression, Neuroendocrine                            Tumor Providers:                Justice Britain, MD, Dulcy Fanny, William Dalton, Technician, [address]. Zenia Resides CRNA, CRNA Referring MD:             Lajuan Lines. Hilarie Fredrickson, MD, Royston Cowper. Legrand Como Medicines:                Monitored Anesthesia Care Complications:            No immediate complications. Estimated Blood Loss:     Estimated blood loss was minimal. Procedure:                Pre-Anesthesia Assessment:                           - Prior to the procedure, a History and Physical                            was performed, and patient medications and                            allergies were reviewed. The patient's tolerance of                            previous anesthesia was also reviewed. The risks                            and benefits of the procedure and the sedation                            options and risks were discussed with the patient.                            All questions were answered, and informed consent                            was obtained. Prior Anticoagulants: The patient has                            taken no anticoagulant or antiplatelet agents  except for NSAID medication. ASA Grade Assessment:                            II - A patient with mild systemic disease. After                            reviewing the risks and benefits, the patient was                            deemed in satisfactory condition to undergo the                            procedure.                            After obtaining informed consent, the endoscope was                            passed under direct vision. Throughout the                            procedure, the patient's blood pressure, pulse, and                            oxygen saturations were monitored continuously. The                            [id number] ([id number]) Olympus therapeutic endoscope                            was introduced through the anus and advanced to the                            the descending colon for ultrasound. The                            [id number] ([id number]) Olympus radial ultrasound                            scope was introduced through the anus and advanced                            to the the rectosigmoid junction for ultrasound.                            The [id number] ([id number]) Olympus Forward View EUS                            was introduced through the anus and advanced to the                            the sigmoid colon for ultrasound. The lower EUS was  accomplished without difficulty. The patient                            tolerated the procedure. Scope In: 1:56:43 PM Scope Out: 2:35:29 PM Total Procedure Duration: 0 hours 38 minutes 46 seconds  Findings:      The perianal and digital rectal examinations were normal. Pertinent       negatives include no palpable rectal lesions.      ENDOSCOPIC FINDING: :      The left colon was significantly tortuous.      Many small-mouthed diverticula were found in the recto-sigmoid colon and       sigmoid colon.      One 10 mm subepithelial nodule was found in the distal sigmoid colon.      Normal mucosa was found in the entire colon otherwise. 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      Non-bleeding non-thrombosed internal hemorrhoids were found during       retroflexion, during perianal exam and  during digital exam. The       hemorrhoids were Grade I (internal hemorrhoids that do not prolapse).      ENDOSONOGRAPHIC FINDING: :      A rounded intramural (subepithelial) lesion was found in the sigmoid       colon. The lesion was hyperechoic. Sonographically, the origin appeared       to be within the submucosa (Layer 3). The lesion measured up to 13 mm in       thickness. The endosonographic borders were smooth.      The rectum was otherwise normal in appearance on EUS.      No malignant-appearing lymph nodes were visualized in the perirectal       region and in the left iliac region. The nodes were.      The perirectal space was otherwise normal.      The internal anal sphincter was visualized endosonographically and       appeared normal. Impression:               FLEX impression:                           - Tortuous left colon.                           - Diverticulosis in the recto-sigmoid colon and in                            the sigmoid colon.                           - Subepithelial nodule in the distal sigmoid colon.                           - Normal mucosa in the entire examined colon                            otherwise.                           -  I did not visualize any signs of a scar site                            within the rectum from previously removed                            neuroendocrine tumor.                           - Non-bleeding non-thrombosed internal hemorrhoids.                           EUS impression:                           - An intramural (subepithelial) lesion was                            visualized endosonographically in the sigmoid                            colon. Sonographically, the origin appeared to be                            within the submucosa (Layer 3). Tissue has not been                            obtained. 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                            - Endosonographic images of the rectum were                            unremarkable.                           - No malignant-appearing lymph nodes were                            visualized endosonographically in the perirectal                            region and in the left iliac region.                           - Endosonographic images of the perirectal space                            were unremarkable.                           - The internal anal sphincter was visualized  endosonographically and appeared normal. Moderate Sedation:      Not Applicable - Patient had care per Anesthesia. Recommendation:           - The patient will be observed post-procedure,                            until all discharge criteria are met.                           - Discharge patient to home.                           - Patient has a contact number available for                            emergencies. The signs and symptoms of potential                            delayed complications were discussed with the                            patient. Return to normal activities tomorrow.                            Written discharge instructions were provided to the                            patient.                           - Resume previous diet.                           - Repeat lower endoscopic ultrasound in 1 year for                            surveillance in the setting of previously noted                            rectal neuroendocrine tumor (negative CT scan                            earlier this year).                           - The findings and recommendations were discussed                            with the patient.                           - The findings and recommendations were discussed                            with the designated responsible adult. Procedure Code(s):        --- Professional ---  83462, Sigmoidoscopy,  flexible; with endoscopic                            ultrasound examination Diagnosis Code(s):        --- Professional ---                           K64.0, First degree hemorrhoids                           K63.89, Other specified diseases of intestine                           I89.9, Noninfective disorder of lymphatic vessels                            and lymph nodes, unspecified                           K57.30, Diverticulosis of large intestine without                            perforation or abscess without bleeding                           Q43.8, Other specified congenital malformations of                            intestine CPT copyright 2022 [employer]. All rights reserved. The codes documented in this report are preliminary and upon coder review may  be revised to meet current compliance requirements. Justice Britain, MD 08/26/2022 3:09:36 PM Number of Addenda: 0

## 2022-08-26 NOTE — Transfer of Care (Signed)
Immediate Anesthesia Transfer of Care Note  Patient: Ariel [name]  Procedure(s) Performed: LOWER ENDOSCOPIC ULTRASOUND (EUS) FLEXIBLE SIGMOIDOSCOPY  Patient Location: PACU and Endoscopy Unit  Anesthesia Type:MAC  Level of Consciousness: awake, alert , oriented, and patient cooperative  Airway & Oxygen Therapy: Patient Spontanous Breathing and Patient connected to face mask oxygen  Post-op Assessment: Report given to RN and Post -op Vital signs reviewed and stable  Post vital signs: Reviewed and stable  Last Vitals:  Vitals Value Taken Time  BP 129/66 08/26/22 1444  Temp    Pulse    Resp 18 08/26/22 1446  SpO2    Vitals shown include unvalidated device data.  Last Pain:  Vitals:   08/26/22 1232  TempSrc: Oral  PainSc: 0-No pain         Complications: No notable events documented.

## 2022-08-26 NOTE — Anesthesia Preprocedure Evaluation (Addendum)
Anesthesia Evaluation  Patient identified by MRN, date of birth, ID band Patient awake    Reviewed: Allergy & Precautions, NPO status , Patient's Chart, lab work & pertinent test results  Airway Mallampati: II  TM Distance: >3 FB Neck ROM: Full    Dental no notable dental hx.    Pulmonary former smoker Quit smoking 2020   Pulmonary exam normal breath sounds clear to auscultation       Cardiovascular hypertension, Pt. on medications Normal cardiovascular exam Rhythm:Regular Rate:Normal     Neuro/Psych  PSYCHIATRIC DISORDERS Anxiety Depression    negative neurological ROS     GI/Hepatic ,,,(+)       marijuana useRectal polyp    Endo/Other  Hypothyroidism    Renal/GU negative Renal ROS     Musculoskeletal  (+) Arthritis , Osteoarthritis,    Abdominal   Peds  Hematology negative hematology ROS (+)   Anesthesia Other Findings   Reproductive/Obstetrics negative OB ROS                             Anesthesia Physical Anesthesia Plan  ASA: 2  Anesthesia Plan: MAC   Post-op Pain Management:    Induction:   PONV Risk Score and Plan: 2 and Propofol infusion and TIVA  Airway Management Planned: Natural Airway and Simple Face Mask  Additional Equipment: None  Intra-op Plan:   Post-operative Plan:   Informed Consent: I have reviewed the patients History and Physical, chart, labs and discussed the procedure including the risks, benefits and alternatives for the proposed anesthesia with the patient or authorized representative who has indicated his/her understanding and acceptance.       Plan Discussed with: CRNA  Anesthesia Plan Comments:        Anesthesia Quick Evaluation

## 2022-08-26 NOTE — Discharge Instructions (Signed)
YOU HAD AN ENDOSCOPIC PROCEDURE TODAY: Refer to the procedure report and other information in the discharge instructions given to you for any specific questions about what was found during the examination. If this information does not answer your questions, please call [place] office at [phone] to clarify.  ° °YOU SHOULD EXPECT: Some feelings of bloating in the abdomen. Passage of more gas than usual. Walking can help get rid of the air that was put into your GI tract during the procedure and reduce the bloating. If you had a lower endoscopy (such as a colonoscopy or flexible sigmoidoscopy) you may notice spotting of blood in your stool or on the toilet paper. Some abdominal soreness may be present for a day or two, also. ° °DIET: Your first meal following the procedure should be a light meal and then it is ok to progress to your normal diet. A half-sandwich or bowl of soup is an example of a good first meal. Heavy or fried foods are harder to digest and may make you feel nauseous or bloated. Drink plenty of fluids but you should avoid alcoholic beverages for 24 hours. If you had a esophageal dilation, please see attached instructions for diet.   ° °ACTIVITY: Your care partner should take you home directly after the procedure. You should plan to take it easy, moving slowly for the rest of the day. You can resume normal activity the day after the procedure however YOU SHOULD NOT DRIVE, use power tools, machinery or perform tasks that involve climbing or major physical exertion for 24 hours (because of the sedation medicines used during the test).  ° °SYMPTOMS TO REPORT IMMEDIATELY: °A gastroenterologist can be reached at any hour. Please call [phone]  for any of the following symptoms:  °Following lower endoscopy (colonoscopy, flexible sigmoidoscopy) °Excessive amounts of blood in the stool  °Significant tenderness, worsening of abdominal pains  °Swelling of the abdomen that is new, acute  °Fever of 100° or  higher  °Following upper endoscopy (EGD, EUS, ERCP, esophageal dilation) °Vomiting of blood or coffee ground material  °New, significant abdominal pain  °New, significant chest pain or pain under the shoulder blades  °Painful or persistently difficult swallowing  °New shortness of breath  °Black, tarry-looking or red, bloody stools ° °FOLLOW UP:  °If any biopsies were taken you will be contacted by phone or by letter within the next 1-3 weeks. Call [phone]  if you have not heard about the biopsies in 3 weeks.  °Please also call with any specific questions about appointments or follow up tests. ° °

## 2022-08-26 NOTE — Anesthesia Postprocedure Evaluation (Signed)
Anesthesia Post Note  Patient: Ariel [name]  Procedure(s) Performed: LOWER ENDOSCOPIC ULTRASOUND (EUS) FLEXIBLE SIGMOIDOSCOPY     Patient location during evaluation: PACU Anesthesia Type: MAC Level of consciousness: awake and alert Pain management: pain level controlled Vital Signs Assessment: post-procedure vital signs reviewed and stable Respiratory status: spontaneous breathing, nonlabored ventilation and respiratory function stable Cardiovascular status: blood pressure returned to baseline and stable Postop Assessment: no apparent nausea or vomiting Anesthetic complications: no   No notable events documented.  Last Vitals:  Vitals:   08/26/22 1504 08/26/22 1505  BP: (!) 152/91 (!) 142/102  Pulse: 68 62  Resp: 20 17  Temp:    SpO2: 97% 100%    Last Pain:  Vitals:   08/26/22 1450  TempSrc:   PainSc: 0-No pain                 Pervis Hocking

## 2022-08-27 ENCOUNTER — Telehealth: Payer: Self-pay

## 2022-08-27 ENCOUNTER — Ambulatory Visit (INDEPENDENT_AMBULATORY_CARE_PROVIDER_SITE_OTHER): Payer: 59 | Admitting: Surgical

## 2022-08-27 DIAGNOSIS — Z9889 Other specified postprocedural states: Secondary | ICD-10-CM

## 2022-08-27 DIAGNOSIS — S21001D Unspecified open wound of right breast, subsequent encounter: Secondary | ICD-10-CM

## 2022-08-27 DIAGNOSIS — N6489 Other specified disorders of breast: Secondary | ICD-10-CM

## 2022-08-27 NOTE — Telephone Encounter (Signed)
-----   Message from Irving Copas., MD sent at 08/27/2022  5:31 AM EST ----- Regarding: Follow-up Ariel [name], This patient will need a 1 year follow-up lower EUS. I will set up a follow-up with one of the APP's or with JMP for constipation issues that she discussed with me after her procedure. Thanks. GM  FYI JMP to EUS results and the subepithelial lesion being a lipoma and follow-up.  I have started her on MiraLAX once to twice daily but she is only using the restroom every 7 days without any laxative therapies so hopefully you can work with her on this in the future. Thanks. GM

## 2022-08-27 NOTE — Telephone Encounter (Signed)
Lower EUS recall has been entered as requested

## 2022-08-27 NOTE — Progress Notes (Signed)
Briefly spoke with patient today via telephone to discuss her nipple areola tattoo on the right.  She reports it faded slightly but otherwise she is doing well.  She is overall pleased with how it looks and does not feel as if she needs any additional tattooing at this point.  We discussed planning to follow-up as needed, calling with questions or concerns.

## 2022-08-31 ENCOUNTER — Other Ambulatory Visit (HOSPITAL_COMMUNITY): Payer: Self-pay | Admitting: Psychiatry

## 2022-08-31 DIAGNOSIS — F39 Unspecified mood [affective] disorder: Secondary | ICD-10-CM

## 2022-09-02 ENCOUNTER — Other Ambulatory Visit: Payer: Self-pay | Admitting: *Deleted

## 2022-09-02 MED ORDER — LEVOTHYROXINE SODIUM 50 MCG PO TABS: 50.0000 ug | ORAL_TABLET | Freq: Every day | ORAL | 1 refills | Status: DC

## 2022-09-09 ENCOUNTER — Telehealth (HOSPITAL_BASED_OUTPATIENT_CLINIC_OR_DEPARTMENT_OTHER): Payer: 59 | Admitting: Psychiatry

## 2022-09-09 ENCOUNTER — Encounter (HOSPITAL_COMMUNITY): Payer: Self-pay | Admitting: Psychiatry

## 2022-09-09 DIAGNOSIS — F39 Unspecified mood [affective] disorder: Secondary | ICD-10-CM

## 2022-09-09 NOTE — Progress Notes (Signed)
BH MD/PA/NP OP Progress Note  09/09/2022 9:16 AM Ariel [name]  MRN:  [id number]  Visit Diagnosis:    ICD-10-CM   1. Mood disorder [facility])  F39       Assessment: Ariel [name] is a 61 y.o. female with a history of mood disorder who presented to [place] at [facility] for initial evaluation on 08/08/22.    At initial evaluation patient reported neurovegetative symptoms of depression including low mood, anhedonia, amotivation, increased fatigue, difficulty sleeping, and increased anxiety.  She denied any SI/HI or thoughts of self-harm along with any history of mania, psychosis, delusions, or paranoia.  Patient also endorsed symptoms of mood lability and increased irritability she has struggled with for an extended period of time. During initial evaluation patient met criteria for unspecified mood disorder with a differential of MDD versus cyclothymia.  She has tried antidepressants in the past with minimal benefit.    Ariel [name] presents for follow-up evaluation. Today, 09/09/22, patient reports not adjusting the medications as discussed over the past month.  She reports wanting this provider to review her genesite testing for making any changes.  We will continue on Pristiq 50 mg until the testing can be reviewed.  Patient endorses psychosocial stressors most notably in relation to her kids and grandson.  Supportive interviewing techniques were employed and cognitive reframing techniques were discussed.  Patient was also encouraged to connect with a therapist to help work through this.  More than 50% of the time spent in psychoeducation, counseling, coordination of care and long-term prognosis.  Patient was given opportunity to ask question and all concerns and questions were addressed and answers.   Plan: - Continue Prystiq 50 mg with plan to taper off after receiving genesite testing - Hold Lamictal until genesite testing is reviewed - Continue Trazodone  25-50 mg QHS - CBC, CMP, A1c,  VIT D, and TSH reviewed - Therapy referral - Continue with wellness coach - Crisis resources discussed - Follow up in a month - Patient to bring in genesite testing  Chief Complaint:  Chief Complaint  Patient presents with   Follow-up   HPI: Ariel [name] presents reporting that she did not start the medicine or make any changes to her current regimen over the past month.  Patient expressed that she would like to hold off on making any changes until this prescriber can review her gene site testing.  She feels that she has gone through a lot of different medications in the past and is hopeful that what ever is tried next will be more permanent.  Patient reached out to her PCP about sending her gene site testing however they reported being unable to do so.  Patient has an appointment with them next month and plans to bring in the testing following that.  We agreed to continue on her current regimen of Pristiq 50 mg daily with the plan to hold off on making medication adjustments until the gene site testing is reviewed.  Patient reports that she has tried reaching out to a therapist but has not heard back.  She was open to being provided with therapy resources today.  She went on to discuss how she carries a lot of guilt pertaining to her kids and how their life turned out.  Patient is worried that she did something that negatively impacted her kids to affect her upbringing.  She realizes logically that she was a good parent, and her kids deny her negatively impacting them, but  patient still holds onto the thoughts that she was not the best parent.  This has been brought to the surface again as her grandson who is staying with her has just found out that he has gotten a girl pregnant with twins.  He is only 60 years old and Ariel [name] feels like she made mistakes to allow this to happen.  We discussed this thought pattern and worked on cognitive reframing techniques.  Past  Psychiatric History: Patient reports that she used to go to therapy in the past but stopped due to feelings of lack of benefit.  She has been tried on a couple psychotropic medications by her PCP including Wellbutrin which she is allergic to, Zoloft which had minimal effect, Pristiq which had minimal effect, and trazodone which works well for sleep.  She denies any prior suicide attempts or thoughts of self-harm and she denies any psychiatric hospitalizations.  Eats and edible every now and then for help with pain.  She denies any other substance use.  Past Medical History:  Past Medical History:  Diagnosis Date   Anxiety    Chronic bronchitis ([place])    Depression    Dyspnea    occasional with exertion   Fibroids, intramural    Goiter    History of blood transfusion 1964   Hyperlipidemia    Hypertension    Hypothyroidism    Open wound of right breast 10/03/2020   Osteopenia 01/2018   T score -1.3 FRAX 3.8% / 0.5%   Pneumonia    Thyroid disease    Tobacco use 05/29/2017    Past Surgical History:  Procedure Laterality Date   ABDOMINAL HYSTERECTOMY     07/2017-Dr [place] per patient; done for fibroids   BREAST REDUCTION SURGERY Bilateral 08/03/2020   Procedure: BREAST REDUCTION WITH LIPOSUCTION;  Surgeon: Wallace Going, DO;  Location: [place];  Service: Plastics;  Laterality: Bilateral;  3.5 hours   BUNIONECTOMY Right    CARDIAC ELECTROPHYSIOLOGY STUDY AND ABLATION  2010   hx tachycardia    EUS N/A 08/26/2022   Procedure: LOWER ENDOSCOPIC ULTRASOUND (EUS);  Surgeon: Irving Copas., MD;  Location: [facility];  Service: Gastroenterology;  Laterality: N/A;  forward viewing, linear radial, mini probe   FLEXIBLE SIGMOIDOSCOPY N/A 08/26/2022   Procedure: FLEXIBLE SIGMOIDOSCOPY;  Surgeon: Rush Landmark Telford Nab., MD;  Location: [facility];  Service: Gastroenterology;  Laterality: N/A;   LAPAROTOMY N/A 06/26/2017   Procedure: EXPLORATORY  LAPAROTOMY;  Surgeon: Everitt Amber, MD;  Location: [facility];  Service: Gynecology;  Laterality: N/A;   LESION EXCISION WITH COMPLEX REPAIR Right 04/03/2022   Procedure: Scar contracture release right breast with fat grafting;  Surgeon: Wallace Going, DO;  Location: [place];  Service: Plastics;  Laterality: Right;   LIPOSUCTION WITH LIPOFILLING Bilateral 04/03/2022   Procedure: LIPOSUCTION WITH LIPOFILLING;  Surgeon: Wallace Going, DO;  Location: [place];  Service: Plastics;  Laterality: Bilateral;   THYROID LOBECTOMY Left 06/18/2019   Procedure: LEFT THYROID LOBECTOMY;  Surgeon: Armandina Gemma, MD;  Location: [facility];  Service: General;  Laterality: Left;   TONSILLECTOMY  1967   TUBAL LIGATION     X2,1 REVERSAL    Family Psychiatric History: Patient denies any family psychiatric history  Family History:  Family History  Problem Relation Age of Onset   Hypertension Mother    Renal Disease Mother    Diabetes Father    Hypertension Father    Renal Disease Father  on dialysis   Thyroid disease Maternal Aunt    Stroke Maternal Grandmother    Breast cancer Maternal Grandmother        diagnosed in her 21's   Cancer Paternal Grandmother        ?   Healthy Daughter    Healthy Son    Colon polyps Neg Hx    Colon cancer Neg Hx    Esophageal cancer Neg Hx    Rectal cancer Neg Hx    Stomach cancer Neg Hx     Social History:  Social History   Socioeconomic History   Marital status: Single    Spouse name: Not on file   Number of children: Not on file   Years of education: Not on file   Highest education level: Not on file  Occupational History   Not on file  Tobacco Use   Smoking status: Former    Packs/day: 0.25    Years: 30.00    Total pack years: 7.50    Types: Cigarettes    Quit date: 06/17/2019    Years since quitting: 3.2   Smokeless tobacco: Never  Vaping Use   Vaping Use: Never used  Substance and Sexual Activity   Alcohol use: Not Currently    Comment:  1-2 x a month   Drug use: Yes    Types: Marijuana    Comment: Occas - last use 03/21/22  occasional edible   Sexual activity: Yes    Partners: Male    Comment: 1ST INTERCOURSE- 42, PARTNERS- 55, CURRENT PARTNER- 3.5 YRS   Other Topics Concern   Not on file  Social History Narrative   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 Christian, no church currenty      Lifestyle: "poor diet"; no exercise   Social Determinants of Radio broadcast assistant Strain: Not on file  Food Insecurity: Not on file  Transportation Needs: Not on file  Physical Activity: Not on file  Stress: Not on file  Social Connections: Not on file    Allergies:  Allergies  Allergen Reactions   Qsymia [Phentermine-Topiramate] Nausea And Vomiting   Wellbutrin [Bupropion] Other (See Comments)    Made her sick    Current Medications: Current Outpatient Medications  Medication Sig Dispense Refill   albuterol (VENTOLIN HFA) 108 (90 Base) MCG/ACT inhaler TAKE 2 PUFFS BY MOUTH EVERY 6 HOURS AS NEEDED FOR WHEEZE OR SHORTNESS OF BREATH 6.7 each 2   atorvastatin (LIPITOR) 20 MG tablet Take 1 tablet (20 mg total) by mouth daily. 90 tablet 1   desvenlafaxine (PRISTIQ) 25 MG 24 hr tablet Take 1 tablet (25 mg total) by mouth daily. Start on 11/3 after completing 14 day script of Viibryd 50 mg (Patient not taking: Reported on 08/22/2022) 14 tablet 0   desvenlafaxine (PRISTIQ) 50 MG 24 hr tablet Take 1 tablet (50 mg total) by mouth daily. Take 50 mg for 14 days before decreasing to 25 mg for 14 days (Patient taking differently: Take 100 mg by mouth daily.) 14 tablet 0   hydrochlorothiazide (HYDRODIURIL) 25 MG tablet Take 1 tablet (25 mg total) by mouth daily. 90 tablet 1   lamoTRIgine (LAMICTAL) 25 MG tablet Take 1 tablet (25 mg total) by mouth daily for 14 days, THEN 2 tablets (50 mg total) daily for 16 days. (Patient not taking: Reported on 08/22/2022) 50 tablet 0    levothyroxine (  SYNTHROID) 50 MCG tablet Take 1 tablet (50 mcg total) by mouth daily. 90 tablet 1   losartan (COZAAR) 25 MG tablet TAKE 1 TABLET (25 MG TOTAL) BY MOUTH DAILY. 90 tablet 1   meloxicam (MOBIC) 15 MG tablet Take 15 mg by mouth daily.     Potassium Chloride ER 20 MEQ TBCR TAKE 20 MEQ BY MOUTH DAILY. 90 tablet 2   sodium chloride (OCEAN) 0.65 % SOLN nasal spray Place 1 spray into both nostrils as needed for congestion.     Spacer/Aero-Holding Chambers DEVI 1 Device by Does not apply route daily as needed. 1 each 0   traZODone (DESYREL) 50 MG tablet Take 0.5-1 tablets (25-50 mg total) by mouth at bedtime as needed. 30 tablet 0   triamcinolone cream (KENALOG) 0.1 % Apply to affected area twice daily for 2 weeks time. (Patient taking differently: Apply 1 Application topically daily as needed (Rash).) 30 g 2   No current facility-administered medications for this visit.     Psychiatric Specialty Exam: Review of Systems  Last menstrual period 06/29/2017.There is no height or weight on file to calculate BMI.  General Appearance: Fairly Groomed  Eye Contact:  Fair  Speech:  Clear and Coherent and Normal Rate  Volume:  Normal  Mood:  Anxious  Affect:  Congruent  Thought Process:  Coherent and Goal Directed  Orientation:  Full (Time, Place, and Person)  Thought Content: Logical   Suicidal Thoughts:  No  Homicidal Thoughts:  No  Memory:  NA  Judgement:  Fair  Insight:  Fair  Psychomotor Activity:  Normal  Concentration:  Concentration: Fair  Recall:  Good  Fund of Knowledge: Fair  Language: Good  Akathisia:  NA    AIMS (if indicated): not done  Assets:  Communication Skills Desire for Improvement Financial Resources/Insurance Housing Talents/Skills Transportation Vocational/Educational  ADL's:  Intact  Cognition: WNL  Sleep:  Good   Metabolic Disorder Labs: Lab Results  Component Value Date   HGBA1C 5.8 02/19/2022   No results found for: "PROLACTIN" Lab  Results  Component Value Date   CHOL 190 02/19/2022   TRIG 114.0 02/19/2022   HDL 51.30 02/19/2022   CHOLHDL 4 02/19/2022   VLDL 22.8 02/19/2022   LDLCALC 116 (H) 02/19/2022   LDLCALC 121 (H) 08/15/2021   Lab Results  Component Value Date   TSH 2.14 02/19/2022   TSH 3.19 07/27/2021    Therapeutic Level Labs: No results found for: "LITHIUM" No results found for: "VALPROATE" No results found for: "CBMZ"   Screenings: PHQ2-9    Flowsheet Row Video Visit from 08/08/2022 in [facility] Office Visit from 06/27/2022 in [place] at [place] from 02/13/2022 in [place] at [place] from 08/15/2021 in [place] at [facility] from 01/05/2021 in [place] at [facility] Total Score _0 PHQ-9 Total Score _1 -- 19      Flowsheet Row Admission (Discharged) from 08/26/2022 in [place] Video Visit from 08/08/2022 in [facility] Admission (Discharged) from 04/03/2022 in [place] No Risk No Risk No Risk       Collaboration of Care: Collaboration of Care: Referral or follow-up with counselor/therapist AEB therapy referral  Patient/Guardian was advised Release of Information must be obtained prior to any record release in order to collaborate their care with an outside provider. Patient/Guardian was advised  if they have not already done so to contact the registration department to sign all necessary forms in order for [place] to release information regarding their care.   Consent: Patient/Guardian gives verbal consent for treatment and assignment of benefits for services provided during this visit. Patient/Guardian expressed understanding and agreed to proceed.    Vista Mink, MD 09/09/2022, 9:16 AM   Virtual Visit via Video Note  I connected  with Azia on 09/09/22 at  9:00 AM EST by a video enabled telemedicine application and verified that I am speaking with the correct person using two identifiers.  Location: Patient: Car Provider: Home Office   I discussed the limitations of evaluation and management by telemedicine and the availability of in person appointments. The patient expressed understanding and agreed to proceed.   I discussed the assessment and treatment plan with the patient. The patient was provided an opportunity to ask questions and all were answered. The patient agreed with the plan and demonstrated an understanding of the instructions.   The patient was advised to call back or seek an in-person evaluation if the symptoms worsen or if the condition fails to improve as anticipated.  I provided 25 minutes of non-face-to-face time during this encounter.   Vista Mink, MD

## 2022-09-10 ENCOUNTER — Ambulatory Visit: Payer: 59 | Admitting: Surgical

## 2022-09-10 ENCOUNTER — Encounter: Payer: Self-pay | Admitting: Family Medicine

## 2022-09-14 ENCOUNTER — Ambulatory Visit
Admission: EM | Admit: 2022-09-14 | Discharge: 2022-09-14 | Disposition: A | Discharge status: Home / Self Care | Payer: 59 | Attending: Physician Assistant | Admitting: Physician Assistant

## 2022-09-14 DIAGNOSIS — N3000 Acute cystitis without hematuria: Secondary | ICD-10-CM | POA: Diagnosis present

## 2022-09-14 LAB — POCT URINALYSIS DIP (MANUAL ENTRY)
Bilirubin, UA: NEGATIVE
Glucose, UA: NEGATIVE mg/dL
Ketones, POC UA: NEGATIVE mg/dL
Nitrite, UA: POSITIVE — AB
Protein Ur, POC: >=300 mg/dL — AB
Spec Grav, UA: >=1.03 — AB (ref 1.010–1.025)
Urobilinogen, UA: 1 E.U./dL
pH, UA: 6 (ref 5.0–8.0)

## 2022-09-14 MED ORDER — NITROFURANTOIN MONOHYD MACRO 100 MG PO CAPS: 100.0000 mg | ORAL_CAPSULE | Freq: Two times a day (BID) | ORAL | 0 refills | Status: DC

## 2022-09-14 NOTE — ED Provider Notes (Signed)
[facility]    CSN: [id number] Arrival date & time: 09/14/22  1427      History   Chief Complaint Chief Complaint  Patient presents with   UTI    HPI Ariel [name] is a 61 y.o. female.   Patient here today for evaluation of urinary frequency, dysuria and cloudy urine that started 3 days ago.  She reports that she had more suprapubic pressure today.  She denies any fever.  She has not had any back pain or abdominal pain.  She denies any nausea or vomiting.  She does have history of UTIs with similar symptoms. She has tried drinking more water without significant improvement.   The history is provided by the patient.    Past Medical History:  Diagnosis Date   Anxiety    Chronic bronchitis (H[facility])    Depression    Dyspnea    occasional with exertion   Fibroids, intramural    Goiter    History of blood transfusion 1963-02-05   Hyperlipidemia    Hypertension    Hypothyroidism    Open wound of right breast 10/03/2020   Osteopenia 01/2018   T score -1.3 FRAX 3.8% / 0.5%   Pneumonia    Thyroid disease    Tobacco use 05/29/2017    Patient Active Problem List   Diagnosis Date Noted   Hypokalemia 06/28/2022   Scar contracture 02/26/2022   Postoperative breast asymmetry 01/23/2022   S/P bilateral breast reduction 09/17/2020   Back pain 05/02/2020   Neck pain 05/02/2020   Symptomatic mammary hypertrophy 05/02/2020   Primary osteoarthritis of right knee 08/26/2019   Left thyroid nodule 03/12/2019   Right knee pain 12/01/2018   CTS (carpal tunnel syndrome) 01/22/2018   Mood disorder ([place]) 01/22/2018   Hypertension 05/29/2017   Hyperlipidemia 05/29/2017   Hyperglycemia 05/29/2017   Aortic atherosclerosis ([place]) 05/29/2017   Goiter 04/24/2017    Past Surgical History:  Procedure Laterality Date   ABDOMINAL HYSTERECTOMY     07/2017-Dr [place] per patient; done for fibroids   BREAST REDUCTION SURGERY Bilateral 08/03/2020   Procedure: BREAST  REDUCTION WITH LIPOSUCTION;  Surgeon: Wallace Going, DO;  Location: [place];  Service: Plastics;  Laterality: Bilateral;  3.5 hours   BUNIONECTOMY Right    CARDIAC ELECTROPHYSIOLOGY STUDY AND ABLATION  2010   hx tachycardia    EUS N/A 08/26/2022   Procedure: LOWER ENDOSCOPIC ULTRASOUND (EUS);  Surgeon: Irving Copas., MD;  Location: [facility];  Service: Gastroenterology;  Laterality: N/A;  forward viewing, linear radial, mini probe   FLEXIBLE SIGMOIDOSCOPY N/A 08/26/2022   Procedure: FLEXIBLE SIGMOIDOSCOPY;  Surgeon: Rush Landmark Telford Nab., MD;  [facility];  Service: Gastroenterology;  Laterality: N/A;   LAPAROTOMY N/A 06/26/2017   Procedure: EXPLORATORY LAPAROTOMY;  Surgeon: Everitt Amber, MD;  [facility];  Service: Gynecology;  Laterality: N/A;   LESION EXCISION WITH COMPLEX REPAIR Right 04/03/2022   Procedure: Scar contracture release right breast with fat grafting;  Surgeon: Wallace Going, DO;  Location: [place];  Service: Plastics;  Laterality: Right;   LIPOSUCTION WITH LIPOFILLING Bilateral 04/03/2022   Procedure: LIPOSUCTION WITH LIPOFILLING;  Surgeon: Wallace Going, DO;  Location: [place];  Service: Plastics;  Laterality: Bilateral;   THYROID LOBECTOMY Left 06/18/2019   Procedure: LEFT THYROID LOBECTOMY;  Surgeon: Armandina Gemma, MD;  [facility];  Service: General;  Laterality: Left;   TONSILLECTOMY  1967   TUBAL LIGATION     X2,1  REVERSAL    OB History     Gravida  2   Para  2   Term      Preterm      AB      Living  2      SAB      IAB      Ectopic      Multiple      Live Births               Home Medications    Prior to Admission medications   Medication Sig Start Date End Date Taking? Authorizing Provider  nitrofurantoin, macrocrystal-monohydrate, (MACROBID) 100 MG capsule Take 1 capsule (100 mg total) by mouth 2 (two) times daily. 09/14/22  Yes Francene Finders, PA-C  albuterol  (VENTOLIN HFA) 108 (90 Base) MCG/ACT inhaler TAKE 2 PUFFS BY MOUTH EVERY 6 HOURS AS NEEDED FOR WHEEZE OR SHORTNESS OF BREATH 02/26/22   Koberlein, Steele Berg, MD  atorvastatin (LIPITOR) 20 MG tablet Take 1 tablet (20 mg total) by mouth daily. 05/02/22   Kennyth Arnold, FNP  desvenlafaxine (PRISTIQ) 25 MG 24 hr tablet Take 1 tablet (25 mg total) by mouth daily. Start on 11/3 after completing 14 day script of Viibryd 50 mg Patient not taking: Reported on 08/22/2022 08/08/22   Vista Mink, MD  desvenlafaxine (PRISTIQ) 50 MG 24 hr tablet Take 1 tablet (50 mg total) by mouth daily. Take 50 mg for 14 days before decreasing to 25 mg for 14 days Patient taking differently: Take 100 mg by mouth daily. 08/08/22   Vista Mink, MD  hydrochlorothiazide (HYDRODIURIL) 25 MG tablet Take 1 tablet (25 mg total) by mouth daily. 06/27/22   Farrel Conners, MD  lamoTRIgine (LAMICTAL) 25 MG tablet Take 1 tablet (25 mg total) by mouth daily for 14 days, THEN 2 tablets (50 mg total) daily for 16 days. Patient not taking: Reported on 08/22/2022 08/08/22 09/07/22  Vista Mink, MD  levothyroxine (SYNTHROID) 50 MCG tablet Take 1 tablet (50 mcg total) by mouth daily. 09/02/22   Farrel Conners, MD  losartan (COZAAR) 25 MG tablet TAKE 1 TABLET (25 MG TOTAL) BY MOUTH DAILY. 03/15/22   Caren Macadam, MD  meloxicam (MOBIC) 15 MG tablet Take 15 mg by mouth daily. 04/01/22   [provider]  Potassium Chloride ER 20 MEQ TBCR TAKE 20 MEQ BY MOUTH DAILY. 07/30/22   Farrel Conners, MD  sodium chloride (OCEAN) 0.65 % SOLN nasal spray Place 1 spray into both nostrils as needed for congestion.    [provider]  Spacer/Aero-Holding Chambers DEVI 1 Device by Does not apply route daily as needed. 11/20/20   Caren Macadam, MD  traZODone (DESYREL) 50 MG tablet Take 0.5-1 tablets (25-50 mg total) by mouth at bedtime as needed. 07/08/22   Farrel Conners, MD  triamcinolone cream (KENALOG) 0.1 % Apply to  affected area twice daily for 2 weeks time. Patient taking differently: Apply 1 Application topically daily as needed (Rash). 02/14/22   Caren Macadam, MD    Family History Family History  Problem Relation Age of Onset   Hypertension Mother    Renal Disease Mother    Diabetes Father    Hypertension Father    Renal Disease Father        on dialysis   Thyroid disease Maternal Aunt    Stroke Maternal Grandmother    Breast cancer Maternal Grandmother  diagnosed in her 58's   Cancer Paternal Grandmother        ?   Healthy Daughter    Healthy Son    Colon polyps Neg Hx    Colon cancer Neg Hx    Esophageal cancer Neg Hx    Rectal cancer Neg Hx    Stomach cancer Neg Hx     Social History Social History   Tobacco Use   Smoking status: Former    Packs/day: 0.25    Years: 30.00    Total pack years: 7.50    Types: Cigarettes    Quit date: 06/17/2019    Years since quitting: 3.2   Smokeless tobacco: Never  Vaping Use   Vaping Use: Never used  Substance Use Topics   Alcohol use: Not Currently    Comment: 1-2 x a month   Drug use: Yes    Types: Marijuana    Comment: Occas - last use 03/21/22  occasional edible     Allergies   Qsymia [phentermine-topiramate] and Wellbutrin [bupropion]   Review of Systems Review of Systems  Constitutional:  Negative for chills and fever.  Eyes:  Negative for discharge and redness.  Gastrointestinal:  Negative for abdominal pain, nausea and vomiting.  Genitourinary:  Positive for dysuria and frequency.  Musculoskeletal:  Negative for back pain.     Physical Exam Triage Vital Signs ED Triage Vitals  Enc Vitals Group     BP 09/14/22 1513 118/81     Pulse Rate 09/14/22 1513 96     Resp 09/14/22 1513 18     Temp 09/14/22 1513 98.3 F (36.8 C)     Temp Source 09/14/22 1513 Oral     SpO2 09/14/22 1513 97 %     Weight --      Height --      Head Circumference --      Peak Flow --      Pain Score 09/14/22 1512 5     Pain  Loc --      Pain Edu? --      Excl. in [place]? --    No data found.  Updated Vital Signs BP 118/81 (BP Location: Right Arm)   Pulse 96   Temp 98.3 F (36.8 C) (Oral)   Resp 18   LMP 06/29/2017   SpO2 97%   Physical Exam Vitals and nursing note reviewed.  Constitutional:      General: She is not in acute distress.    Appearance: Normal appearance. She is not ill-appearing.  HENT:     Head: Normocephalic and atraumatic.  Eyes:     Conjunctiva/sclera: Conjunctivae normal.  Cardiovascular:     Rate and Rhythm: Normal rate.  Pulmonary:     Effort: Pulmonary effort is normal.  Neurological:     Mental Status: She is alert.  Psychiatric:        Mood and Affect: Mood normal.        Behavior: Behavior normal.        Thought Content: Thought content normal.      UC Treatments / Results  Labs (all labs ordered are listed, but only abnormal results are displayed) Labs Reviewed  POCT URINALYSIS DIP (MANUAL ENTRY) - Abnormal; Notable for the following components:      Result Value   Clarity, UA turbid (*)    Spec Grav, UA >=1.030 (*)    Blood, UA large (*)    Protein Ur, POC >=300 (*)    Nitrite,  UA Positive (*)    Leukocytes, UA Large (3+) (*)    All other components within normal limits  URINE CULTURE    EKG   Radiology No results found.  Procedures Procedures (including critical care time)  Medications Ordered in UC Medications - No data to display  Initial Impression / Assessment and Plan / UC Course  I have reviewed the triage vital signs and the nursing notes.  Pertinent labs & imaging results that were available during my care of the patient were reviewed by me and considered in my medical decision making (see chart for details).    Macrobid prescribed for UTI and urine culture ordered.  Recommended further evaluation if symptoms fail to improve or worsen.  Patient expresses understanding.  Final Clinical Impressions(s) / UC Diagnoses   Final  diagnoses:  Acute cystitis without hematuria   Discharge Instructions   None    ED Prescriptions     Medication Sig Dispense Auth. Provider   nitrofurantoin, macrocrystal-monohydrate, (MACROBID) 100 MG capsule Take 1 capsule (100 mg total) by mouth 2 (two) times daily. 10 capsule Francene Finders, PA-C      PDMP not reviewed this encounter.   Francene Finders, PA-C 09/14/22 1529

## 2022-09-14 NOTE — ED Triage Notes (Signed)
Pt presents with urinary frequency, pain during urination, and cloudy urine X 3 days.

## 2022-09-17 LAB — URINE CULTURE: Culture: >=100000 — AB

## 2022-09-17 NOTE — Progress Notes (Unsigned)
Name: Ariel [name]  MRN/ DOB: [id number], 09/23/1961    Age/ Sex: 61 y.o., female     PCP: Farrel Conners, MD   Reason for Endocrinology Evaluation: [place]     Initial Endocrinology Clinic Visit: 04/24/2017    PATIENT IDENTIFIER: Ariel [name] is a 61 y.o., female with a past medical history of MNG. She has followed with [facility] since 04/24/2017 for consultative assistance with management of her MNG.   HISTORICAL SUMMARY: The patient was first diagnosed with MNG in 2007. She is S/P benign FNA of the left nodule in 2018. She is S/P left lobectomy 05/2019 with benign pathology due to a large nodule > 4 cm    Pt followed with Dr. Loanne Drilling from 2018 until 07/2021  SUBJECTIVE:    Today (09/17/2022):  Ariel [name] is here for a follow up on hypothyroidism.    Levothyroxine 50 mcg daily   HISTORY:  Past Medical History:  Past Medical History:  Diagnosis Date   Anxiety    Chronic bronchitis (HCC)    Depression    Dyspnea    occasional with exertion   Fibroids, intramural    Goiter    History of blood transfusion 1964   Hyperlipidemia    Hypertension    Hypothyroidism    Open wound of right breast 10/03/2020   Osteopenia 01/2018   T score -1.3 FRAX 3.8% / 0.5%   Pneumonia    Thyroid disease    Tobacco use 05/29/2017   Past Surgical History:  Past Surgical History:  Procedure Laterality Date   ABDOMINAL HYSTERECTOMY     07/2017-Dr [place] per patient; done for fibroids   BREAST REDUCTION SURGERY Bilateral 08/03/2020   Procedure: BREAST REDUCTION WITH LIPOSUCTION;  Surgeon: Wallace Going, DO;  Location: [facility];  Service: Plastics;  Laterality: Bilateral;  3.5 hours   BUNIONECTOMY Right    CARDIAC ELECTROPHYSIOLOGY STUDY AND ABLATION  2010   hx tachycardia    EUS N/A 08/26/2022   Procedure: LOWER ENDOSCOPIC ULTRASOUND (EUS);  Surgeon: Irving Copas., MD;  Location: [facility];  Service:  Gastroenterology;  Laterality: N/A;  forward viewing, linear radial, mini probe   FLEXIBLE SIGMOIDOSCOPY N/A 08/26/2022   Procedure: FLEXIBLE SIGMOIDOSCOPY;  Surgeon: Rush Landmark Telford Nab., MD;  Location: [facility];  Service: Gastroenterology;  Laterality: N/A;   LAPAROTOMY N/A 06/26/2017   Procedure: EXPLORATORY LAPAROTOMY;  Surgeon: Everitt Amber, MD;  Location: [facility];  Service: Gynecology;  Laterality: N/A;   LESION EXCISION WITH COMPLEX REPAIR Right 04/03/2022   Procedure: Scar contracture release right breast with fat grafting;  Surgeon: Wallace Going, DO;  Location: [facility];  Service: Plastics;  Laterality: Right;   LIPOSUCTION WITH LIPOFILLING Bilateral 04/03/2022   Procedure: LIPOSUCTION WITH LIPOFILLING;  Surgeon: Wallace Going, DO;  Location: [facility];  Service: Plastics;  Laterality: Bilateral;   THYROID LOBECTOMY Left 06/18/2019   Procedure: LEFT THYROID LOBECTOMY;  Surgeon: Armandina Gemma, MD;  Location: [facility];  Service: General;  Laterality: Left;   TONSILLECTOMY  1967   TUBAL LIGATION     X2,1 REVERSAL   Social History:  reports that she quit smoking about 3 years ago. Her smoking use included cigarettes. She has a 7.50 pack-year smoking history. She has never used smokeless tobacco. She reports that she does not currently use alcohol. She reports current drug use. Drug: Marijuana. Family History:  Family History  Problem Relation Age of Onset   Hypertension Mother  Renal Disease Mother    Diabetes Father    Hypertension Father    Renal Disease Father        on dialysis   Thyroid disease Maternal Aunt    Stroke Maternal Grandmother    Breast cancer Maternal Grandmother        diagnosed in her 5's   Cancer Paternal Grandmother        ?   Healthy Daughter    Healthy Son    Colon polyps Neg Hx    Colon cancer Neg Hx    Esophageal cancer Neg Hx    Rectal cancer Neg Hx    Stomach cancer Neg Hx      HOME MEDICATIONS: Allergies as of 09/18/2022        Reactions   Qsymia [phentermine-topiramate] Nausea And Vomiting   Wellbutrin [bupropion] Other (See Comments)   Made her sick        Medication List        Accurate as of September 17, 2022 12:00 PM. If you have any questions, ask your nurse or doctor.          albuterol 108 (90 Base) MCG/ACT inhaler Commonly known as: VENTOLIN HFA TAKE 2 PUFFS BY MOUTH EVERY 6 HOURS AS NEEDED FOR WHEEZE OR SHORTNESS OF BREATH   atorvastatin 20 MG tablet Commonly known as: LIPITOR Take 1 tablet (20 mg total) by mouth daily.   desvenlafaxine 50 MG 24 hr tablet Commonly known as: Pristiq Take 1 tablet (50 mg total) by mouth daily. Take 50 mg for 14 days before decreasing to 25 mg for 14 days What changed:  how much to take additional instructions   desvenlafaxine 25 MG 24 hr tablet Commonly known as: Pristiq Take 1 tablet (25 mg total) by mouth daily. Start on 11/3 after completing 14 day script of Viibryd 50 mg What changed: Another medication with the same name was changed. Make sure you understand how and when to take each.   hydrochlorothiazide 25 MG tablet Commonly known as: HYDRODIURIL Take 1 tablet (25 mg total) by mouth daily.   lamoTRIgine 25 MG tablet Commonly known as: LaMICtal Take 1 tablet (25 mg total) by mouth daily for 14 days, THEN 2 tablets (50 mg total) daily for 16 days. Start taking on: August 08, 2022   levothyroxine 50 MCG tablet Commonly known as: SYNTHROID Take 1 tablet (50 mcg total) by mouth daily.   losartan 25 MG tablet Commonly known as: COZAAR TAKE 1 TABLET (25 MG TOTAL) BY MOUTH DAILY.   meloxicam 15 MG tablet Commonly known as: MOBIC Take 15 mg by mouth daily.   nitrofurantoin (macrocrystal-monohydrate) 100 MG capsule Commonly known as: MACROBID Take 1 capsule (100 mg total) by mouth 2 (two) times daily.   Potassium Chloride ER 20 MEQ Tbcr TAKE 20 MEQ BY MOUTH DAILY.   sodium chloride 0.65 % Soln nasal spray Commonly known as:  OCEAN Place 1 spray into both nostrils as needed for congestion.   Spacer/Aero-Holding [employer] 1 Device by Does not apply route daily as needed.   traZODone 50 MG tablet Commonly known as: DESYREL Take 0.5-1 tablets (25-50 mg total) by mouth at bedtime as needed.   triamcinolone cream 0.1 % Commonly known as: KENALOG Apply to affected area twice daily for 2 weeks time. What changed:  how much to take how to take this when to take this reasons to take this additional instructions          OBJECTIVE:  PHYSICAL EXAM: VS: LMP 06/29/2017    EXAM: General: Pt appears well and is in NAD  Eyes: External eye exam normal without stare, lid lag or exophthalmos.  EOM intact.    Neck: General: Supple without adenopathy. Thyroid: Thyroid size normal.  No goiter or nodules appreciated. No thyroid bruit.  Lungs: Clear with good BS bilat with no rales, rhonchi, or wheezes  Heart: Auscultation: RRR.  Abdomen: Normoactive bowel sounds, soft, nontender, without masses or organomegaly palpable  Extremities:  BL LE: No pretibial edema normal ROM and strength.  Mental Status: Judgment, insight: Intact Orientation: Oriented to time, place, and person Mood and affect: No depression, anxiety, or agitation     DATA REVIEWED: ***   Thyroid Ultrasound 01/24/2020 Parenchymal Echotexture: Mildly heterogenous   Isthmus: 0.2 cm   Right lobe: 5.2 x 1.5 x 1.8 cm   Left lobe: Surgically absent   _________________________________________________________   Estimated total number of nodules >/= 1 cm: 0   Number of spongiform nodules >/=  2 cm not described below (TR1): 0   Number of mixed cystic and solid nodules >/= 1.5 cm not described below (TR2): 0   _________________________________________________________   No discrete nodules are seen within the residual right-sided thyroid gland. No evidence of recurrent thyroid tissue or abnormal nodularity in the left thyroid  resection bed. No suspicious lymphadenopathy.   IMPRESSION: 1. Surgical changes of prior left hemithyroidectomy without evidence of residual or recurrent thyroid tissue, nodularity or lymphadenopathy. 2. No evidence of nodule in the remaining right thyroid gland.     ASSESSMENT / PLAN / RECOMMENDATIONS:   ***  Plan: ***    Medications   ***   Signed electronically by: Mack Guise, MD  [facility]  [facility] [address] [place], [place] [address] Phone: [phone] FAX: [phone]      CC: Farrel Conners, MD [address] [place] [address] Phone: [phone]  Fax: [phone]   Return to Endocrinology clinic as below: Future Appointments  Date Time Provider [place]  09/18/2022 11:10 AM Tovah Slavick, Melanie Crazier, MD [facility] None  09/25/2022  2:30 PM Princess Bruins, MD [facility] None  09/26/2022  8:30 AM Farrel Conners, MD [facility]  11/01/2022  8:30 AM Dillingham, Loel Lofty, DO [facility] None

## 2022-09-18 ENCOUNTER — Encounter: Payer: Self-pay | Admitting: Internal Medicine

## 2022-09-18 ENCOUNTER — Ambulatory Visit (INDEPENDENT_AMBULATORY_CARE_PROVIDER_SITE_OTHER): Payer: 59 | Admitting: Internal Medicine

## 2022-09-18 VITALS — BP 130/70 | HR 77 | Ht 63.0 in | Wt 167.0 lb

## 2022-09-18 DIAGNOSIS — E039 Hypothyroidism, unspecified: Secondary | ICD-10-CM

## 2022-09-18 LAB — TSH: TSH: 1.25 u[IU]/mL (ref 0.35–5.50)

## 2022-09-18 MED ORDER — LEVOTHYROXINE SODIUM 50 MCG PO TABS: 50.0000 ug | ORAL_TABLET | Freq: Every day | ORAL | 3 refills | Status: DC

## 2022-09-18 NOTE — Patient Instructions (Signed)

## 2022-09-22 ENCOUNTER — Other Ambulatory Visit: Payer: Self-pay | Admitting: Family Medicine

## 2022-09-22 DIAGNOSIS — I10 Essential (primary) hypertension: Secondary | ICD-10-CM

## 2022-09-25 ENCOUNTER — Ambulatory Visit: Payer: 59 | Admitting: Obstetrics & Gynecology

## 2022-09-26 ENCOUNTER — Ambulatory Visit (INDEPENDENT_AMBULATORY_CARE_PROVIDER_SITE_OTHER): Payer: 59 | Admitting: Family Medicine

## 2022-09-26 ENCOUNTER — Encounter: Payer: Self-pay | Admitting: Family Medicine

## 2022-09-26 ENCOUNTER — Telehealth: Payer: Self-pay | Admitting: *Deleted

## 2022-09-26 DIAGNOSIS — L309 Dermatitis, unspecified: Secondary | ICD-10-CM | POA: Diagnosis not present

## 2022-09-26 DIAGNOSIS — F39 Unspecified mood [affective] disorder: Secondary | ICD-10-CM

## 2022-09-26 MED ORDER — TRIAMCINOLONE ACETONIDE 0.1 % EX CREA: 1.0000 | TOPICAL_CREAM | Freq: Every day | CUTANEOUS | 2 refills | Status: DC | PRN

## 2022-09-26 MED ORDER — DESVENLAFAXINE SUCCINATE ER 100 MG PO TB24: 100.0000 mg | ORAL_TABLET | Freq: Every day | ORAL | 1 refills | Status: DC

## 2022-09-26 NOTE — Telephone Encounter (Signed)
Patient came in today for a follow up appointment and requested a copy of the GeneSight testing results previously ordered by Dr Ethlyn Gallery.  Patient was given a copy of the results from June 2023.

## 2022-09-26 NOTE — Patient Instructions (Addendum)
B complex vitamins (B1 B6 and B12)  Calcium 1200 mg per day and vitamin D3 2000 units daily  Berberine 500 or 600 mg 30 minutes before each meal  Psyllium husk-- powder- - 1 scoop in drink or protein shake

## 2022-09-26 NOTE — Assessment & Plan Note (Signed)
Symptoms are much improved on the pristiq. Will chance her dose to 100 mg daily and she will continue this medication as prescribed. RTC in 5 months for follow up.

## 2022-09-26 NOTE — Progress Notes (Signed)
Established Patient Office Visit  Subjective   Patient ID: Ariel [name], female    DOB: [birth year]  Age: 61 y.o. MRN: [id number]  Chief Complaint  Patient presents with   Follow-up    Patient is here for 3 month follow up. She is up to 100 mg on the pristiq and she is feeling good on the medication so far. No side effects reported. States that she did go to the psychiatrist but she wants to stick with the pristiq. States that she is feeling that her appetite is increasing. We discussed possible medication to help however she had SE to the Qsyma she was given previously by the weight loss center and she may not qualify for the injections (BMI is <30). We discussed supplements and other strategies to help reduce appetite.   Pt is also reporting that her rash on her skin is coming and going, was prescribed triamcinolone for this in the past which worked for her, would like a refill of the cream today.   Current Outpatient Medications  Medication Instructions   albuterol (VENTOLIN HFA) 108 (90 Base) MCG/ACT inhaler TAKE 2 PUFFS BY MOUTH EVERY 6 HOURS AS NEEDED FOR WHEEZE OR SHORTNESS OF BREATH   atorvastatin (LIPITOR) 20 mg, Oral, Daily   desvenlafaxine (PRISTIQ) 100 mg, Oral, Daily   hydrochlorothiazide (HYDRODIURIL) 25 mg, Oral, Daily   levothyroxine (SYNTHROID) 50 mcg, Oral, Daily   losartan (COZAAR) 25 mg, Oral, Daily   meloxicam (MOBIC) 15 mg, Oral, Daily   Potassium Chloride ER 20 MEQ TBCR 20 mEq, Oral, Daily   sodium chloride (OCEAN) 0.65 % SOLN nasal spray 1 spray, Each Nare, As needed   Spacer/Aero-Holding [employer] DEVI 1 Device, Does not apply, Daily PRN   traZODone (DESYREL) 25-50 mg, Oral, At bedtime PRN   triamcinolone cream (KENALOG) 0.1 % 1 Application, Topical, Daily PRN    Patient Active Problem List   Diagnosis Date Noted   Hypokalemia 06/28/2022   Scar contracture 02/26/2022   Postoperative breast asymmetry 01/23/2022   S/P bilateral breast reduction 09/17/2020    Back pain 05/02/2020   Neck pain 05/02/2020   Symptomatic mammary hypertrophy 05/02/2020   Primary osteoarthritis of right knee 08/26/2019   Left thyroid nodule 03/12/2019   Right knee pain 12/01/2018   CTS (carpal tunnel syndrome) 01/22/2018   Mood disorder ([place]) 01/22/2018   Hypertension 05/29/2017   Hyperlipidemia 05/29/2017   Hyperglycemia 05/29/2017   Aortic atherosclerosis ([place]) 05/29/2017   Goiter 04/24/2017      Review of Systems  All other systems reviewed and are negative.     Objective:     BP 102/80 (BP Location: Left Arm, Patient Position: Sitting, Cuff Size: Large)   Pulse 89   Temp 98.3 F (36.8 C) (Oral)   Ht '5\' 3"'$  (1.6 m)   Wt 166 lb 14.4 oz (75.7 kg)   LMP 06/29/2017   SpO2 98%   BMI 29.57 kg/m    Physical Exam Vitals reviewed.  Constitutional:      Appearance: Normal appearance. She is well-groomed and normal weight.  Eyes:     Conjunctiva/sclera: Conjunctivae normal.  Cardiovascular:     Rate and Rhythm: Normal rate and regular rhythm.     Pulses: Normal pulses.     Heart sounds: S1 normal and S2 normal.  Pulmonary:     Effort: Pulmonary effort is normal.     Breath sounds: Normal air entry.  Skin:    General: Skin is warm and dry.  Findings: No lesion or rash.  Neurological:     Mental Status: She is alert and oriented to person, place, and time. Mental status is at baseline.     Gait: Gait is intact.  Psychiatric:        Mood and Affect: Mood and affect normal.        Speech: Speech normal.        Behavior: Behavior normal.        Judgment: Judgment normal.      No results found for any visits on 09/26/22.    The 10-year ASCVD risk score (Arnett DK, et al., 2019) is: 3.2%    Assessment & Plan:   Problem List Items Addressed This Visit       Unprioritized   Mood disorder ([place])    Symptoms are much improved on the pristiq. Will chance her dose to 100 mg daily and she will continue this medication as prescribed.  RTC in 5 months for follow up.      Relevant Medications   desvenlafaxine (PRISTIQ) 100 MG 24 hr tablet   Other Visit Diagnoses     Eczema, unspecified type       Relevant Medications   No rash seen on exam today, will refill her triamcinolone cream PRN. triamcinolone cream (KENALOG) 0.1 %       Return in about 5 months (around 02/25/2023) for follow up -- will need bloodwork.    Farrel Conners, MD

## 2022-09-30 ENCOUNTER — Encounter: Payer: Self-pay | Admitting: Family Medicine

## 2022-10-02 ENCOUNTER — Telehealth: Payer: Self-pay | Admitting: *Deleted

## 2022-10-02 NOTE — Telephone Encounter (Signed)
Dr Legrand Como completed the medical accommodation request forms and these were faxed to [facility] at [phone] and sent to be scanned.  I called the patient, informed her of this and offered to leave a copy at the front desk.  Patient declined and stated she should be able to see this in her chart.

## 2022-10-24 ENCOUNTER — Other Ambulatory Visit: Payer: Self-pay | Admitting: Family

## 2022-10-25 ENCOUNTER — Encounter: Payer: Self-pay | Admitting: Family Medicine

## 2022-10-25 DIAGNOSIS — L989 Disorder of the skin and subcutaneous tissue, unspecified: Secondary | ICD-10-CM

## 2022-10-28 ENCOUNTER — Encounter: Payer: Self-pay | Admitting: Internal Medicine

## 2022-10-30 NOTE — Telephone Encounter (Signed)
Ok to place referral to dermatology for suspicious skin lesion

## 2022-11-01 ENCOUNTER — Ambulatory Visit: Payer: 59 | Admitting: Plastic Surgery

## 2022-11-14 NOTE — Telephone Encounter (Signed)
Forms were found in the chart under "medical release of information from 12/21" and this was faxed to the number below.  Spoke with the patient and informed her of this.  Patient stated she has not heard from anyone regarding the referral to dermatology.  I advised the patient it can take some time to get in for an appt; however the patient was given to the number to contact the [facility] at [phone] to schedule an appt.

## 2022-11-14 NOTE — Telephone Encounter (Signed)
Pt called to ask if CMA could please refax documents to:  [phone]  Re:  Medical Accomodation Request Forms  Pt is asking for a call back to confirm docs sent  If unavailable , please leave a voicemail.

## 2022-11-21 ENCOUNTER — Ambulatory Visit: Payer: 59 | Admitting: Obstetrics & Gynecology

## 2022-11-29 ENCOUNTER — Ambulatory Visit: Payer: 59 | Admitting: Family Medicine

## 2022-11-29 ENCOUNTER — Encounter: Payer: Self-pay | Admitting: Family Medicine

## 2022-11-29 VITALS — BP 120/82 | HR 88 | Temp 98.4°F | Ht 63.0 in | Wt 166.4 lb

## 2022-11-29 DIAGNOSIS — D649 Anemia, unspecified: Secondary | ICD-10-CM | POA: Diagnosis not present

## 2022-11-29 DIAGNOSIS — R202 Paresthesia of skin: Secondary | ICD-10-CM | POA: Diagnosis not present

## 2022-11-29 NOTE — Patient Instructions (Signed)
Add probiotics daily

## 2022-11-29 NOTE — Progress Notes (Signed)
Acute Office Visit  Subjective:     Patient ID: Ariel [name], female    DOB: Feb 28, 1961, 62 y.o.   MRN: [id number]  Chief Complaint  Patient presents with   Numbness    Patient complains of a tingling sensation, "pins sticking her" in the right upper arm x1 month, lack of strength in the right hand since breast surgery in 2022    HPI Patient is in today for tingling/ pins and needles sensation in her right arm on the outside of her upper arm. Comes and goes, lasts for a few seconds, states that she is also getting a tingling sensation in her feet when she is standing. The right arm tingling started after having breast reduction surgery in 2022. States she feels a loss of strength in her right hand. Noticed this after her second right breast surgery over the summer 2023. 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   Had scar contraction release in the right breast in June 2023, weakness started after that, states that the tingling  in her right arm  Review of Systems  Neurological:  Positive for sensory change.   Current Outpatient Medications  Medication Instructions   albuterol (VENTOLIN HFA) 108 (90 Base) MCG/ACT inhaler TAKE 2 PUFFS BY MOUTH EVERY 6 HOURS AS NEEDED FOR WHEEZE OR SHORTNESS OF BREATH   atorvastatin (LIPITOR) 20 mg, Oral, Daily   B Complex Vitamins (B COMPLEX PO) Oral, Daily   Berberine Chloride (BERBERINE HCI PO) 500 mg, Oral, Daily   CALCIUM PO 1,200 mg, Oral, Daily   desvenlafaxine (PRISTIQ) 100 mg, Oral, Daily   hydrochlorothiazide (HYDRODIURIL) 25 mg, Oral, Daily   levothyroxine (SYNTHROID) 50 mcg, Oral, Daily   losartan (COZAAR) 25 mg, Oral, Daily   meloxicam (MOBIC) 15 mg, Oral, Daily   Potassium Chloride ER 20 MEQ TBCR 20 mEq, Oral, Daily   Prenatal Vit-Fe Fumarate-FA (PRENATAL MULTIVITAMIN) TABS tablet 1 tablet, Oral, Daily   PSYLLIUM HUSK PO Oral, As needed   sodium chloride (OCEAN) 0.65 % SOLN nasal spray  1 spray, Each Nare, As needed   Spacer/Aero-Holding Chambers DEVI 1 Device, Does not apply, Daily PRN   traZODone (DESYREL) 25-50 mg, Oral, At bedtime PRN   triamcinolone cream (KENALOG) 0.1 % 1 Application, Topical, Daily PRN        Objective:    BP 120/82 (BP Location: Left Arm, Patient Position: Sitting, Cuff Size: Normal)   Pulse 88   Temp 98.4 F (36.9 C) (Oral)   Ht 5\' 3"  (1.6 m)   Wt 166 lb 6.4 oz (75.5 kg)   LMP 06/29/2017   SpO2 98%   BMI 29.48 kg/m    Physical Exam Vitals reviewed.  Constitutional:      Appearance: Normal appearance. She is normal weight.  Cardiovascular:     Rate and Rhythm: Normal rate and regular rhythm.     Heart sounds: Normal heart sounds.  Pulmonary:     Effort: Pulmonary effort is normal.  Musculoskeletal:        General: No swelling or tenderness. Normal range of motion.     Cervical back: Normal range of motion.  Neurological:     General: No focal deficit present.     Mental Status: She is alert and oriented to person, place, and time. Mental status is at baseline.  Psychiatric:        Mood and Affect: Mood normal.        Behavior:  Behavior normal.        Assessment & Plan:   Problem List Items Addressed This Visit   None Visit Diagnoses     Anemia, unspecified type    -  Primary   Relevant Orders   History of, will check new labs to follow up on this problem iron deficiency may cause neuropathy-type symptoms....  Iron, TIBC and Ferritin Panel (Completed)   CBC (no diff) (Completed)   Paresthesia of arm       Relevant Orders   Most likely a nerve impingement, could be higher up at the shoulder. I recommend sending her to get an EMG of the upper extremities to figure out where the impingement is and then will direct her to the correct specialist for possible need for procedure for treatment.   Ambulatory referral to Neurology       No orders of the defined types were placed in this encounter.   No follow-ups on  file.  Karie Georges, MD

## 2022-11-30 LAB — CBC
HCT: 38.8 % (ref 35.0–45.0)
Hemoglobin: 13.2 g/dL (ref 11.7–15.5)
MCH: 30.3 pg (ref 27.0–33.0)
MCHC: 34 g/dL (ref 32.0–36.0)
MCV: 89 fL (ref 80.0–100.0)
MPV: 11.7 fL (ref 7.5–12.5)
Platelets: 221 10*3/uL (ref 140–400)
RBC: 4.36 10*6/uL (ref 3.80–5.10)
RDW: 13.8 % (ref 11.0–15.0)
WBC: 5.1 10*3/uL (ref 3.8–10.8)

## 2022-11-30 LAB — IRON,TIBC AND FERRITIN PANEL
%SAT: 15 % (calc) — ABNORMAL LOW (ref 16–45)
Ferritin: 9 ng/mL — ABNORMAL LOW (ref 16–232)
Iron: 66 ug/dL (ref 45–160)
TIBC: 448 mcg/dL (calc) (ref 250–450)

## 2022-12-03 ENCOUNTER — Encounter: Payer: Self-pay | Admitting: Neurology

## 2022-12-05 ENCOUNTER — Encounter: Payer: Self-pay | Admitting: Internal Medicine

## 2022-12-05 NOTE — Progress Notes (Signed)
Initial neurology clinic note  SERVICE DATE: 12/13/22  Reason for Evaluation: Consultation requested by Farrel Conners, MD for an opinion regarding right upper extremity tingling and weakness. My final recommendations will be communicated back to the requesting physician by way of shared medical record or letter to requesting physician via [place] mail.  HPI: This is [name], a 62 y.o. right-handed female with a medical history of HLD, HTN, hypothyroidism, depression, cardiac arrhythmia s/p ablation who presents to neurology clinic with the chief complaint of right arm weakness and tingling. The patient is alone today.  Patient had a breast reduction surgery (~2021). There were several complications including necrosis and loss of the nipple. She has had to have some revisions. Since the surgeries, she has had weakness of the right arm and some tingling of the right arm. She has a lot of pain in the right shoulder as well. The weakness seems to be more distally with her grip, but no difficulty lifting her arm. Her tingling is localized over the deltoid (by patient pointing at the symptoms). She feels like her hand is going to sleep and has to shake it out. She denies significant symptoms at night.   She does endorse carrying a big bag over the right shoulder. She types all day. She also endorses neck pain.  She has no symptoms in the left arm. She occasionally gets some tingling in her feet when sitting too long.  Patient was started on B complex in December of 2023. She is desvenlafaxine for depression.  She does not report any constitutional symptoms like fever, night sweats, anorexia or unintentional weight loss.  EtOH use: Very rare  Restrictive diet? No Family history of neuropathy/myopathy/neurologic disease? No  Patient has never had an EMG.   MEDICATIONS:  Outpatient Encounter Medications as of 12/13/2022  Medication Sig   albuterol (VENTOLIN HFA) 108 (90 Base)  MCG/ACT inhaler TAKE 2 PUFFS BY MOUTH EVERY 6 HOURS AS NEEDED FOR WHEEZE OR SHORTNESS OF BREATH   atorvastatin (LIPITOR) 20 MG tablet Take 1 tablet (20 mg total) by mouth daily.   B Complex Vitamins (B COMPLEX PO) Take by mouth daily.   Berberine Chloride (BERBERINE HCI PO) Take 500 mg by mouth daily.   CALCIUM PO Take 1,200 mg by mouth daily.   desvenlafaxine (PRISTIQ) 100 MG 24 hr tablet Take 1 tablet (100 mg total) by mouth daily.   hydrochlorothiazide (HYDRODIURIL) 25 MG tablet Take 1 tablet (25 mg total) by mouth daily.   levothyroxine (SYNTHROID) 50 MCG tablet Take 1 tablet (50 mcg total) by mouth daily.   losartan (COZAAR) 25 MG tablet TAKE 1 TABLET (25 MG TOTAL) BY MOUTH DAILY.   meloxicam (MOBIC) 15 MG tablet Take 15 mg by mouth daily.   Potassium Chloride ER 20 MEQ TBCR TAKE 20 MEQ BY MOUTH DAILY.   Prenatal Vit-Fe Fumarate-FA (PRENATAL MULTIVITAMIN) TABS tablet Take 1 tablet by mouth daily at 12 noon.   PSYLLIUM HUSK PO Take by mouth as needed.   sodium chloride (OCEAN) 0.65 % SOLN nasal spray Place 1 spray into both nostrils as needed for congestion.   Spacer/Aero-Holding Chambers DEVI 1 Device by Does not apply route daily as needed.   traZODone (DESYREL) 50 MG tablet Take 0.5-1 tablets (25-50 mg total) by mouth at bedtime as needed.   triamcinolone cream (KENALOG) 0.1 % Apply 1 Application topically daily as needed (Rash).   No facility-administered encounter medications on file as of 12/13/2022.  PAST MEDICAL HISTORY: Past Medical History:  Diagnosis Date   Anxiety    Chronic bronchitis ([place])    Depression    Dyspnea    occasional with exertion   Fibroids, intramural    Goiter    History of blood transfusion 1964   Hyperlipidemia    Hypertension    Hypothyroidism    Open wound of right breast 10/03/2020   Osteopenia 01/2018   T score -1.3 FRAX 3.8% / 0.5%   Pneumonia    Thyroid disease    Tobacco use 05/29/2017    PAST SURGICAL HISTORY: Past Surgical  History:  Procedure Laterality Date   ABDOMINAL HYSTERECTOMY     07/2017-Dr Rossi-Chapel Saloni Lablanc per patient; done for fibroids   BREAST REDUCTION SURGERY Bilateral 08/03/2020   Procedure: BREAST REDUCTION WITH LIPOSUCTION;  Surgeon: Wallace Going, DO;  Location: [facility];  Service: Plastics;  Laterality: Bilateral;  3.5 hours   BUNIONECTOMY Right    CARDIAC ELECTROPHYSIOLOGY STUDY AND ABLATION  2010   hx tachycardia    EUS N/A 08/26/2022   Procedure: LOWER ENDOSCOPIC ULTRASOUND (EUS);  Surgeon: Irving Copas., MD;  Location: [facility];  Service: Gastroenterology;  Laterality: N/A;  forward viewing, linear radial, mini probe   FLEXIBLE SIGMOIDOSCOPY N/A 08/26/2022   Procedure: FLEXIBLE SIGMOIDOSCOPY;  Surgeon: Rush Landmark Telford Nab., MD;  Location: [facility];  Service: Gastroenterology;  Laterality: N/A;   LAPAROTOMY N/A 06/26/2017   Procedure: EXPLORATORY LAPAROTOMY;  Surgeon: Everitt Amber, MD;  Location: [facility];  Service: Gynecology;  Laterality: N/A;   LESION EXCISION WITH COMPLEX REPAIR Right 04/03/2022   Procedure: Scar contracture release right breast with fat grafting;  Surgeon: Wallace Going, DO;  Location: [facility];  Service: Plastics;  Laterality: Right;   LIPOSUCTION WITH LIPOFILLING Bilateral 04/03/2022   Procedure: LIPOSUCTION WITH LIPOFILLING;  Surgeon: Wallace Going, DO;  Location: [facility];  Service: Plastics;  Laterality: Bilateral;   THYROID LOBECTOMY Left 06/18/2019   Procedure: LEFT THYROID LOBECTOMY;  Surgeon: Armandina Gemma, MD;  Location: [facility];  Service: General;  Laterality: Left;   TONSILLECTOMY  1967   TUBAL LIGATION     X2,1 REVERSAL    ALLERGIES: Allergies  Allergen Reactions   Qsymia [Phentermine-Topiramate] Nausea And Vomiting   Wellbutrin [Bupropion] Other (See Comments)    Made her sick    FAMILY HISTORY: Family History  Problem Relation Age of Onset   Hypertension Mother    Renal Disease Mother     Diabetes Father    Hypertension Father    Renal Disease Father        on dialysis   Thyroid disease Maternal Aunt    Stroke Maternal Grandmother    Breast cancer Maternal Grandmother        diagnosed in her 42's   Cancer Paternal Grandmother        ?   Healthy Daughter    Healthy Son    Colon polyps Neg Hx    Colon cancer Neg Hx    Esophageal cancer Neg Hx    Rectal cancer Neg Hx    Stomach cancer Neg Hx     SOCIAL HISTORY: Social History   Tobacco Use   Smoking status: Former    Packs/day: 0.25    Years: 30.00    Total pack years: 7.50    Types: Cigarettes    Quit date: 06/17/2019    Years since quitting: 3.4   Smokeless tobacco: Never  Vaping Use   Vaping  Use: Never used  Substance Use Topics   Alcohol use: Not Currently    Comment: 1-2 x a month   Drug use: Yes    Types: Marijuana    Comment: Occas - last use 03/21/22  occasional edible   Social History   Social History Narrative   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 Christian, no church currenty      Lifestyle: "poor diet"; no exercise   Are you right handed or left handed? right   Are you currently employed ? yes   What is your current occupation?    Do you live at home alone? yes   Who lives with you?    What type of home do you live in: 1 story or 2 story? two    Caffeine a lot a day     OBJECTIVE: PHYSICAL EXAM: BP 138/89   Pulse (!) 53   Ht '5\' 3"'$  (1.6 m)   Wt 165 lb (74.8 kg)   LMP 06/29/2017   SpO2 98%   BMI 29.23 kg/m   General: General appearance: Awake and alert. No distress. Cooperative with exam.  Skin: No obvious rash or jaundice. HEENT: Atraumatic. Anicteric. Lungs: Non-labored breathing on room air  Extremities: No significant edema. No obvious deformity.  Musculoskeletal: No obvious joint swelling. Psych: Affect appropriate.  Neurological: Mental Status: Alert. Speech fluent. No pseudobulbar  affect Cranial Nerves: CNII: No RAPD. Visual fields grossly intact. CNIII, IV, VI: PERRL. No nystagmus. EOMI. CN V: Facial sensation intact bilaterally to fine touch. CN VII: Facial muscles symmetric and strong. No ptosis at rest. CN VIII: Hearing grossly intact bilaterally. CN IX: No hypophonia. CN X: Palate elevates symmetrically. CN XI: Full strength shoulder shrug bilaterally. CN XII: Tongue protrusion full and midline. No atrophy or fasciculations. No significant dysarthria Motor: Tone is normal. No atrophy.  Individual muscle group testing (MRC grade out of 5):  Movement     Neck flexion 5    Neck extension 5     Right Left   Shoulder abduction 5 5   Shoulder adduction 5 5   Shoulder ext rotation 5 5   Shoulder int rotation 5 5   Elbow flexion 5 5   Elbow extension 5 5   Wrist extension 5 5   Wrist flexion 5 5   Finger abduction - FDI 5 5   Finger abduction - ADM 5 5   Finger extension 5 5   Finger distal flexion - 2/'3 5 5   '$ Finger distal flexion - 4/'5 5 5   '$ Thumb flexion - FPL 5 5   Thumb abduction - APB 5- 5-    Hip flexion 5 5   Hip extension 5 5   Hip adduction 5 5   Hip abduction 5 5   Knee extension 5 5   Knee flexion 5 5   Dorsiflexion 5 5   Plantarflexion 5 5     Reflexes:  Right Left   Bicep 2+ 2+   Tricep 2+ 2+   BrRad 2+ 2+   Knee 2+ 2+   Ankle 2+ 2+    Pathological Reflexes: Babinski: flexor response bilaterally Hoffman: absent bilaterally Troemner: absent bilaterally Sensation: Pinprick: Intact in all extremities. Tinel's test negative at bilateral carpal tunnel and cubital tunnel. Coordination: Intact finger-to- nose-finger bilaterally. Romberg negative.  Gait: Able to rise from chair with arms crossed unassisted.  Normal, narrow-based gait.  Lab and Test Review: Internal labs: CBC (11/29/22) unremarkable Iron studies (11/29/22): significant for ferritin of 9 TSH (09/18/22): 1.25  ASSESSMENT: Ariel [name] is a 62 y.o. female who  presents for evaluation of numbness, tingling, and weakness of the right upper extremity. She has a relevant medical history of HLD, HTN, hypothyroidism, depression, cardiac arrhythmia s/p ablation. Her neurological examination is essentially normal today. The etiology of patient's symptoms is currently unclear. Her exam did not provide any helpful clues today. Her sensory deficit (by patient report) appear in the axillary distribution, but her subjective weakness is more distal. A cervical radiculopathy (?C7) is also possible given her neck pain. Carpal tunnel or ulnar neuropathy seem less likely. I will get an EMG to further characterize.  PLAN: -EMG: RUE  -Return to clinic to be determined after testing is complete  The impression above as well as the plan as outlined below were extensively discussed with the patient who voiced understanding. All questions were answered to their satisfaction.  When available, results of the above investigations and possible further recommendations will be communicated to the patient via telephone/MyChart. Patient to call office if not contacted after expected testing turnaround time.   Total time spent reviewing records, interview, history/exam, documentation, and coordination of care on day of encounter:  35 min   Thank you for allowing me to participate in patient's care.  If I can answer any additional questions, I would be pleased to do so.  Kai Levins, MD   CC: Farrel Conners, MD [address] [place] [address]  CC: Referring provider: Farrel Conners, [place],   [id number]

## 2022-12-13 ENCOUNTER — Ambulatory Visit: Payer: 59 | Admitting: Neurology

## 2022-12-13 ENCOUNTER — Encounter: Payer: Self-pay | Admitting: Neurology

## 2022-12-13 VITALS — BP 138/89 | HR 53 | Ht 63.0 in | Wt 165.0 lb

## 2022-12-13 DIAGNOSIS — R29898 Other symptoms and signs involving the musculoskeletal system: Secondary | ICD-10-CM | POA: Diagnosis not present

## 2022-12-13 DIAGNOSIS — R202 Paresthesia of skin: Secondary | ICD-10-CM | POA: Diagnosis not present

## 2022-12-13 DIAGNOSIS — R209 Unspecified disturbances of skin sensation: Secondary | ICD-10-CM

## 2022-12-13 DIAGNOSIS — R200 Anesthesia of skin: Secondary | ICD-10-CM

## 2022-12-13 NOTE — Patient Instructions (Signed)
I suspect there is a problem with a nerve in your right arm causing you symptoms.  I would like to investigate further with a muscle and nerve test called an EMG (see more information below). When I have your results, we will discuss next steps.  Please let me know if you have any questions or concerns in the meantime.  The physicians and staff at [facility] are committed to providing excellent care. You may receive a survey requesting feedback about your experience at our office. We strive to receive "very good" responses to the survey questions. If you feel that your experience would prevent you from giving the office a "very good " response, please contact our office to try to remedy the situation. We may be reached at [phone]. Thank you for taking the time out of your busy day to complete the survey.  Kai Levins, MD [facility]  ELECTROMYOGRAM AND NERVE CONDUCTION STUDIES (EMG/NCS) INSTRUCTIONS  How to Prepare The neurologist conducting the EMG will need to know if you have certain medical conditions. Tell the neurologist and other EMG lab personnel if you: Have a pacemaker or any other electrical medical device Take blood-thinning medications Have hemophilia, a blood-clotting disorder that causes prolonged bleeding Bathing Take a shower or bath shortly before your exam in order to remove oils from your skin. Don't apply lotions or creams before the exam.  What to Expect You'll likely be asked to change into a hospital gown for the procedure and lie down on an examination table. The following explanations can help you understand what will happen during the exam.  Electrodes. The neurologist or a technician places surface electrodes at various locations on your skin depending on where you're experiencing symptoms. Or the neurologist may insert needle electrodes at different sites depending on your symptoms.  Sensations. The electrodes will at times transmit a tiny  electrical current that you may feel as a twinge or spasm. The needle electrode may cause discomfort or pain that usually ends shortly after the needle is removed. If you are concerned about discomfort or pain, you may want to talk to the neurologist about taking a short break during the exam.  Instructions. During the needle EMG, the neurologist will assess whether there is any spontaneous electrical activity when the muscle is at rest - activity that isn't present in healthy muscle tissue - and the degree of activity when you slightly contract the muscle.  He or she will give you instructions on resting and contracting a muscle at appropriate times. Depending on what muscles and nerves the neurologist is examining, he or she may ask you to change positions during the exam.  After your EMG You may experience some temporary, minor bruising where the needle electrode was inserted into your muscle. This bruising should fade within several days. If it persists, contact your primary care doctor.

## 2022-12-16 ENCOUNTER — Ambulatory Visit: Payer: 59 | Admitting: Neurology

## 2022-12-16 ENCOUNTER — Telehealth: Payer: Self-pay | Admitting: Neurology

## 2022-12-16 DIAGNOSIS — R209 Unspecified disturbances of skin sensation: Secondary | ICD-10-CM

## 2022-12-16 DIAGNOSIS — M5412 Radiculopathy, cervical region: Secondary | ICD-10-CM

## 2022-12-16 DIAGNOSIS — G5601 Carpal tunnel syndrome, right upper limb: Secondary | ICD-10-CM

## 2022-12-16 DIAGNOSIS — R200 Anesthesia of skin: Secondary | ICD-10-CM

## 2022-12-16 DIAGNOSIS — R29898 Other symptoms and signs involving the musculoskeletal system: Secondary | ICD-10-CM

## 2022-12-16 NOTE — Procedures (Signed)
[facility]  [place], [address]  [place], [place] [address] Tel: [phone] Fax: [phone] Test Date:  12/16/2022  Patient: Ariel [name] DOB: [birth year] Physician: Kai Levins, MD  Sex: Female Height: '5\' 3"'$  Ref Phys: Kai Levins, MD  ID#: [id number]   Technician:    History: This is a 62 year old female with right arm numbness, tingling, and weakness.  NCV & EMG Findings: Extensive electrodiagnostic evaluation of the right upper limb shows: Right median, ulnar, and radial sensory responses are within normal limits. Right median-ulnar palmar sensory nerve responses showed an abnormal peak latency difference (0.62 ms). Right median (APB) and ulnar (ADM) sensory responses are within normal limits. Chronic motor axon loss changes without evidence of active denervation changes are seen in the right first dorsal interosseous and right extensor indicis proprius muscles. All other tested muscles are within normal limits with normal motor unit configuration and recruitment patterns.  Impression: This is an abnormal study. 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 Evidence of a right median mononeuropathy at or distal to the wrist, consistent with carpal tunnel syndrome, as evidenced by abnormal median-ulnar palmar peak latency difference, very mild in degree electrically. Screening studies for right ulnar or radial mononeuropathies are normal.    ___________________________ Kai Levins, MD    Nerve Conduction Studies Motor Nerve Results    Latency Amplitude F-Lat Segment Distance CV Comment  Site (ms) Norm (mV) Norm (ms)  (cm) (m/s) Norm   Right Median (APB) Motor  Wrist 2.9  < 4.0 11.4  > 6.0        Elbow 7.3 - 11.4 -  Elbow-Wrist 27.5 63  > 50   Right Ulnar (ADM) Motor  Wrist 1.70  < 3.1 7.7  > 7.0        Bel elbow 5.3  - 7.7 -  Bel elbow-Wrist 21.5 60  > 50   Ab elbow 7.1 - 7.1 -  Ab elbow-Bel elbow 10 56 -    Sensory Sites    Neg Peak Lat Amplitude (O-P) Segment Distance Velocity Comment  Site (ms) Norm (V) Norm  (cm) (ms)   Right Median Sensory  Wrist-Dig II 3.4  < 3.6 19  > 15 Wrist-Dig II 13    Right Median-Ulnar Palmar Sensory       Median  Palm-Wrist *2.3  < 2.2 18  > 10 Palm-Wrist 8         Ulnar  Palm-Wrist 1.68  < 2.2 16  > 5 Palm-Wrist 8    Right Radial Sensory  Forearm-Wrist 2.0  < 2.7 30  > 14 Forearm-Wrist 10    Right Ulnar Sensory  Wrist-Dig V 2.6  < 3.1 23  > 10 Wrist-Dig V 11     Inter-Nerve Comparisons   Nerve 1 Value 1 Nerve 2 Value 2 Parameter Result Normal  Sensory Sites  R Median Palm-Wrist 2.3 ms R Ulnar Palm-Wrist 1.68 ms Peak Lat Diff *0.62 ms <0.40   Electromyography   Side Muscle Ins.Act Fibs Fasc Recrt Amp Dur Poly Activation Comment  Right FDI Nml Nml Nml *1- *1+ *1+ *1+ Nml N/A  Right EIP Nml Nml Nml *1- *1+ *1+ *1+ Nml N/A  Right APB Nml Nml Nml Nml Nml Nml Nml Nml N/A  Right Pronator teres Nml Nml Nml Nml Nml Nml Nml Nml N/A  Right Biceps Nml Nml  Nml Nml Nml Nml Nml Nml N/A  Right Triceps Nml Nml Nml Nml Nml Nml Nml Nml N/A  Right Deltoid Nml Nml Nml Nml Nml Nml Nml Nml N/A  Right C7 PSP Nml Nml Nml Nml Nml Nml Nml Nml N/A      Waveforms:  Motor      Sensory

## 2022-12-16 NOTE — Telephone Encounter (Signed)
Discussed the results of patient's EMG after the procedure today. 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 Splinting for CTS was recommended.  All questions were answered.  Kai Levins, MD [facility]

## 2022-12-18 ENCOUNTER — Ambulatory Visit: Payer: 59 | Admitting: Obstetrics & Gynecology

## 2022-12-21 ENCOUNTER — Other Ambulatory Visit: Payer: Self-pay | Admitting: Psychiatry

## 2022-12-21 ENCOUNTER — Other Ambulatory Visit: Payer: Self-pay | Admitting: Family Medicine

## 2022-12-21 DIAGNOSIS — F39 Unspecified mood [affective] disorder: Secondary | ICD-10-CM

## 2023-01-06 ENCOUNTER — Encounter: Payer: 59 | Admitting: Neurology

## 2023-01-08 ENCOUNTER — Telehealth: Payer: Self-pay | Admitting: Family Medicine

## 2023-01-08 NOTE — Telephone Encounter (Signed)
Work from home? I just wrote for an ergonomic keyboard, mouse and numeric pad.. I would just call the patient and find out what she needs.Marland KitchenMarland Kitchen

## 2023-01-08 NOTE — Telephone Encounter (Signed)
Requesting clarification on letter submitted for medical accomodation. Clarification on how many days patient to work from home. States a message can be left

## 2023-01-08 NOTE — Telephone Encounter (Signed)
Left a message for the patient to return my call.  

## 2023-01-09 NOTE — Telephone Encounter (Signed)
Spoke with the patient and informed her of the message below.  Patient stated PCP completed paperwork last month (see scanned paperwork) for her to work from home depending upon how she feels, which is not every day.  Stated now her employer requires specific time/days and she needs a note to work from home 4 days a week as needed due to joint pain.  Requested this be faxed to the Medical Accommodations Team at [phone].  Message sent to PCP for approval and the patient is aware she will be contacted if there is a problem; otherwise this will be faxed to the number above.

## 2023-01-10 ENCOUNTER — Encounter: Payer: Self-pay | Admitting: *Deleted

## 2023-01-10 NOTE — Telephone Encounter (Signed)
Ok to write letter with the above specifications!

## 2023-01-10 NOTE — Telephone Encounter (Signed)
Letter completed and faxed to Medical Accommodations as below.

## 2023-01-21 ENCOUNTER — Encounter: Payer: Self-pay | Admitting: Family Medicine

## 2023-01-23 NOTE — Telephone Encounter (Signed)
Ariel [name] is calling and would like joanne to return her call concerning medical accommodation

## 2023-01-23 NOTE — Telephone Encounter (Signed)
Left a detailed message at the patient's cell number stating we do not contact any employer to discuss any information over the phone as only forms or faxes are sent and the letter was completed and faxed a few weeks ago.  Requested patient call back and leave a detailed message with any questions she has.

## 2023-01-23 NOTE — Telephone Encounter (Signed)
Ariel [name] the pt would like for you to call her before speaking with Izora Gala. Pt has sent mychart message

## 2023-01-24 NOTE — Telephone Encounter (Signed)
Printed and placed in the red folder.

## 2023-01-24 NOTE — Telephone Encounter (Signed)
Ok to print the scanned forms and I will  change the end date

## 2023-01-29 NOTE — Telephone Encounter (Signed)
Previously completed [employer] Accommodation forms were printed, PCP added info as requested below and these were re-faxed to [phone] and sent to be scanned.  Message also sent to the patient via Mychart message.

## 2023-01-29 NOTE — Telephone Encounter (Signed)
Ariel [name] is calling and need clarification on pt working for home is it up to 3 days a wk or its up to 3 days per wk as needed

## 2023-01-30 NOTE — Telephone Encounter (Signed)
Spoke with [name] and informed her we do not give any verbal information to employers regarding work accommodations, as this is generally requested via form/fax and I do not have a signed release from the patient. I asked [name] a number of times to please review the paperwork that was completed and faxed by the PCP on 4/10.

## 2023-01-30 NOTE — Telephone Encounter (Signed)
[name] with [employer]   [phone]  Called asking about information regarding a medical accommodation  CMA was with a patient Caller requesting a call back at [facility] earliest convenience.

## 2023-01-30 NOTE — Telephone Encounter (Signed)
Noted  

## 2023-02-21 ENCOUNTER — Ambulatory Visit: Payer: 59 | Admitting: Plastic Surgery

## 2023-02-25 ENCOUNTER — Encounter: Payer: Self-pay | Admitting: Family Medicine

## 2023-02-25 ENCOUNTER — Ambulatory Visit (INDEPENDENT_AMBULATORY_CARE_PROVIDER_SITE_OTHER): Payer: 59 | Admitting: Family Medicine

## 2023-02-25 VITALS — BP 102/64 | HR 95 | Temp 98.0°F | Ht 63.0 in | Wt 163.1 lb

## 2023-02-25 DIAGNOSIS — M159 Polyosteoarthritis, unspecified: Secondary | ICD-10-CM | POA: Diagnosis not present

## 2023-02-25 DIAGNOSIS — I10 Essential (primary) hypertension: Secondary | ICD-10-CM

## 2023-02-25 DIAGNOSIS — E785 Hyperlipidemia, unspecified: Secondary | ICD-10-CM | POA: Diagnosis not present

## 2023-02-25 DIAGNOSIS — M5412 Radiculopathy, cervical region: Secondary | ICD-10-CM | POA: Insufficient documentation

## 2023-02-25 LAB — LIPID PANEL
Cholesterol: 178 mg/dL (ref 0–200)
HDL: 52.7 mg/dL (ref >39.00)
LDL Cholesterol: 108 mg/dL — ABNORMAL HIGH (ref 0–99)
NonHDL: 125.38
Total CHOL/HDL Ratio: 3
Triglycerides: 85 mg/dL (ref 0.0–149.0)
VLDL: 17 mg/dL (ref 0.0–40.0)

## 2023-02-25 LAB — BASIC METABOLIC PANEL
BUN: 19 mg/dL (ref 6–23)
CO2: 30 mEq/L (ref 19–32)
Calcium: 9.9 mg/dL (ref 8.4–10.5)
Chloride: 100 mEq/L (ref 96–112)
Creatinine, Ser: 0.98 mg/dL (ref 0.40–1.20)
GFR: 62.91 mL/min (ref >60.00)
Glucose, Bld: 96 mg/dL (ref 70–99)
Potassium: 3.6 mEq/L (ref 3.5–5.1)
Sodium: 138 mEq/L (ref 135–145)

## 2023-02-25 LAB — MAGNESIUM: Magnesium: 1.8 mg/dL (ref 1.5–2.5)

## 2023-02-25 MED ORDER — LOSARTAN POTASSIUM 25 MG PO TABS: 25.0000 mg | ORAL_TABLET | Freq: Every day | ORAL | 1 refills | Status: DC

## 2023-02-25 MED ORDER — DICLOFENAC SODIUM 75 MG PO TBEC: 75.0000 mg | DELAYED_RELEASE_TABLET | Freq: Two times a day (BID) | ORAL | 2 refills | Status: DC

## 2023-02-25 NOTE — Assessment & Plan Note (Signed)
Most likely diagnosis causing the finger joint pain. I recommended diclofenac 75 mg BID for arthritis pain. If this does not improve the stiffness/ pain then I will send her to the hand surgeon.

## 2023-02-25 NOTE — Assessment & Plan Note (Signed)
Needs new lipid panel today, continue atorvastatin 20 mg daily

## 2023-02-25 NOTE — Progress Notes (Signed)
Established Patient Office Visit  Subjective   Patient ID: Ariel [name], female    DOB: [birth year]  Age: 62 y.o. MRN: [id number]  Chief Complaint  Patient presents with   Medical Management of Chronic Issues    Patient is here for follow up. She saw the neurologist and was diagnosed with C8 radiculopathy and mild carpal tunnel, she is wearing a brace on her wrist at night and she reports that the numbness is getting better. Today she is reporting increasing pain in her finger and hand joints. She reports that she is experiencing some swelling in her fingers. States that her knees are also somewhat painful.    Current Outpatient Medications  Medication Instructions   albuterol (VENTOLIN HFA) 108 (90 Base) MCG/ACT inhaler TAKE 2 PUFFS BY MOUTH EVERY 6 HOURS AS NEEDED FOR WHEEZE OR SHORTNESS OF BREATH   atorvastatin (LIPITOR) 20 mg, Oral, Daily   B Complex Vitamins (B COMPLEX PO) Oral, Daily   Berberine Chloride (BERBERINE HCI PO) 500 mg, Oral, Daily   CALCIUM PO 1,200 mg, Oral, Daily   cholecalciferol (VITAMIN D3) 1,000 Units, Oral, Daily   clobetasol cream (TEMOVATE) 0.05 % Topical, As needed   desvenlafaxine (PRISTIQ) 100 mg, Oral, Daily   diclofenac (VOLTAREN) 75 mg, Oral, 2 times daily   hydrochlorothiazide (HYDRODIURIL) 25 mg, Oral, Daily   levothyroxine (SYNTHROID) 50 mcg, Oral, Daily   losartan (COZAAR) 25 mg, Oral, Daily   Potassium Chloride ER 20 MEQ TBCR 20 mEq, Oral, Daily   Prenatal Vit-Fe Fumarate-FA (PRENATAL MULTIVITAMIN) TABS tablet 1 tablet, Oral, Daily   PSYLLIUM HUSK PO Oral, As needed   sodium chloride (OCEAN) 0.65 % SOLN nasal spray 1 spray, Each Nare, As needed   Spacer/Aero-Holding Chambers DEVI 1 Device, Does not apply, Daily PRN   traZODone (DESYREL) 25-50 mg, Oral, At bedtime PRN    Patient Active Problem List   Diagnosis Date Noted   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 02/25/2023   Cervical radiculopathy at C8 02/25/2023    Hypokalemia 06/28/2022   Scar contracture 02/26/2022   Postoperative breast asymmetry 01/23/2022   S/P bilateral breast reduction 09/17/2020   Back pain 05/02/2020   Neck pain 05/02/2020   Symptomatic mammary hypertrophy 05/02/2020   Primary osteoarthritis of right knee 08/26/2019   Left thyroid nodule 03/12/2019   Right knee pain 12/01/2018   Carpal tunnel syndrome on right 01/22/2018   Mood disorder (HCC) 01/22/2018   Hypertension 05/29/2017   Hyperlipidemia 05/29/2017   Hyperglycemia 05/29/2017   Aortic atherosclerosis (HCC) 05/29/2017   Goiter 04/24/2017      Review of Systems  All other systems reviewed and are negative.     Objective:     BP 102/64 (BP Location: Left Arm, Patient Position: Sitting, Cuff Size: Normal)   Pulse 95   Temp 98 F (36.7 C) (Oral)   Ht 5\' 3"  (1.6 m)   Wt 163 lb 1.6 oz (74 kg)   LMP 06/29/2017   SpO2 96%   BMI 28.89 kg/m    Physical Exam Vitals reviewed.  Constitutional:      Appearance: Normal appearance. She is well-groomed and normal weight.  Eyes:     Conjunctiva/sclera: Conjunctivae normal.  Neck:     Thyroid: No thyromegaly.  Cardiovascular:     Rate and Rhythm: Normal rate and regular rhythm.     Pulses: Normal pulses.     Heart sounds: S1 normal and S2 normal.  Pulmonary:  Effort: Pulmonary effort is normal.     Breath sounds: Normal breath sounds and air entry.  Abdominal:     General: Bowel sounds are normal.  Musculoskeletal:     Right lower leg: No edema.     Left lower leg: No edema.  Neurological:     Mental Status: She is alert and oriented to person, place, and time. Mental status is at baseline.     Gait: Gait is intact.  Psychiatric:        Mood and Affect: Mood and affect normal.        Speech: Speech normal.        Behavior: Behavior normal.        Judgment: Judgment normal.       The 10-year ASCVD risk score (Arnett DK, et al., 2019) is: 3.3%    Assessment & Plan:  Osteoarthritis  involving multiple joints on both sides of body Assessment & Plan: Most likely diagnosis causing the finger joint pain. I recommended diclofenac 75 mg BID for arthritis pain. If this does not improve the stiffness/ pain then I will send her to the hand surgeon.   Orders: -     Diclofenac Sodium; Take 1 tablet (75 mg total) by mouth 2 (two) times daily.  Dispense: 60 tablet; Refill: 2  Hyperlipidemia, unspecified hyperlipidemia type Assessment & Plan: Needs new lipid panel today, continue atorvastatin 20 mg daily  Orders: -     Lipid panel  Primary hypertension Assessment & Plan: BP well controlled Current hypertension medications:       Sig   hydrochlorothiazide (HYDRODIURIL) 25 MG tablet (Taking) Take 1 tablet (25 mg total) by mouth daily.   losartan (COZAAR) 25 MG tablet Take 1 tablet (25 mg total) by mouth daily.      Pt is reporting hand cramps/ joint stiffness, will repeat potassium and magnesium  Orders: -     Losartan Potassium; Take 1 tablet (25 mg total) by mouth daily.  Dispense: 90 tablet; Refill: 1 -     Basic metabolic panel -     Magnesium     Return in about 6 months (around 08/28/2023) for annual physical exam.    Karie Georges, MD

## 2023-02-25 NOTE — Assessment & Plan Note (Signed)
BP well controlled Current hypertension medications:       Sig   hydrochlorothiazide (HYDRODIURIL) 25 MG tablet (Taking) Take 1 tablet (25 mg total) by mouth daily.   losartan (COZAAR) 25 MG tablet Take 1 tablet (25 mg total) by mouth daily.      Pt is reporting hand cramps/ joint stiffness, will repeat potassium and magnesium

## 2023-02-28 ENCOUNTER — Encounter: Payer: Self-pay | Admitting: Obstetrics & Gynecology

## 2023-03-03 ENCOUNTER — Ambulatory Visit: Payer: 59 | Admitting: Obstetrics & Gynecology

## 2023-03-19 ENCOUNTER — Encounter: Payer: Self-pay | Admitting: Family Medicine

## 2023-03-19 DIAGNOSIS — J4520 Mild intermittent asthma, uncomplicated: Secondary | ICD-10-CM

## 2023-03-20 MED ORDER — SPACER/AERO-HOLDING CHAMBERS DEVI
1.0000 | Freq: Every day | 0 refills | Status: AC | PRN
Start: 2023-03-20 — End: ?

## 2023-03-31 MED ORDER — ALBUTEROL SULFATE HFA 108 (90 BASE) MCG/ACT IN AERS: INHALATION_SPRAY | RESPIRATORY_TRACT | 2 refills | Status: DC

## 2023-04-14 MED ORDER — ATORVASTATIN CALCIUM 20 MG PO TABS: 20.0000 mg | ORAL_TABLET | Freq: Every day | ORAL | 1 refills | Status: DC

## 2023-04-14 NOTE — Addendum Note (Signed)
Addended by: Johnella Moloney on: 04/14/2023 10:01 AM   Modules accepted: Orders

## 2023-04-14 NOTE — Telephone Encounter (Signed)
Ok to refill atorvastatin

## 2023-05-20 ENCOUNTER — Other Ambulatory Visit: Payer: Self-pay | Admitting: Family Medicine

## 2023-05-20 DIAGNOSIS — E876 Hypokalemia: Secondary | ICD-10-CM

## 2023-05-20 DIAGNOSIS — I10 Essential (primary) hypertension: Secondary | ICD-10-CM

## 2023-05-21 ENCOUNTER — Other Ambulatory Visit: Payer: Self-pay | Admitting: Family Medicine

## 2023-05-21 DIAGNOSIS — F39 Unspecified mood [affective] disorder: Secondary | ICD-10-CM

## 2023-05-28 ENCOUNTER — Telehealth: Payer: Self-pay | Admitting: Gastroenterology

## 2023-05-28 ENCOUNTER — Encounter: Payer: Self-pay | Admitting: Internal Medicine

## 2023-05-28 NOTE — Telephone Encounter (Signed)
Inbound call from patient requesting a call back to discuss scheduling her recall EUS with Dr. Meridee Score. Please advise.

## 2023-05-28 NOTE — Telephone Encounter (Signed)
The pt wanted to confirm that she is due for lower EUS in Nov this year.  I did let her know she will be contacted in Nov for case.  The recall has been entered

## 2023-06-12 ENCOUNTER — Other Ambulatory Visit: Payer: Self-pay | Admitting: Family Medicine

## 2023-06-12 DIAGNOSIS — J4520 Mild intermittent asthma, uncomplicated: Secondary | ICD-10-CM

## 2023-06-17 ENCOUNTER — Other Ambulatory Visit: Payer: Self-pay | Admitting: Family Medicine

## 2023-06-17 DIAGNOSIS — M159 Polyosteoarthritis, unspecified: Secondary | ICD-10-CM

## 2023-06-23 NOTE — Progress Notes (Deleted)
NEUROLOGY FOLLOW UP OFFICE NOTE  MAGEN VOLIN [id number]  Subjective:  BAMMA ANGELO is a 62 y.o. year old right-handed female with a medical history of HLD, HTN, hypothyroidism, depression, cardiac arrhythmia s/p ablation who we last saw on 12/13/22 for right arm weakness and tingling.  To briefly review: Patient had a breast reduction surgery (~2021). There were several complications including necrosis and loss of the nipple. She has had to have some revisions. Since the surgeries, she has had weakness of the right arm and some tingling of the right arm. She has a lot of pain in the right shoulder as well. The weakness seems to be more distally with her grip, but no difficulty lifting her arm. Her tingling is localized over the deltoid (by patient pointing at the symptoms). She feels like her hand is going to sleep and has to shake it out. She denies significant symptoms at night.    She does endorse carrying a big bag over the right shoulder. She types all day. She also endorses neck pain.   She has no symptoms in the left arm. She occasionally gets some tingling in her feet when sitting too long.   Patient was started on B complex in December of 2023. She is desvenlafaxine for depression.   She does not report any constitutional symptoms like fever, night sweats, anorexia or unintentional weight loss.   EtOH use: Very rare  Restrictive diet? No Family history of neuropathy/myopathy/neurologic disease? No  Most recent Assessment and Plan (12/13/22): SMANATHA CRUMBLISS is a 62 y.o. female who presents for evaluation of numbness, tingling, and weakness of the right upper extremity. She has a relevant medical history of HLD, HTN, hypothyroidism, depression, cardiac arrhythmia s/p ablation. Her neurological examination is essentially normal today. The etiology of patient's symptoms is currently unclear. Her exam did not provide any helpful clues today. Her sensory deficit (by patient  report) appear in the axillary distribution, but her subjective weakness is more distal. A cervical radiculopathy (?C7) is also possible given her neck pain. Carpal tunnel or ulnar neuropathy seem less likely. I will get an EMG to further characterize.   PLAN: -EMG: RUE  Since their last visit: EMG on 12/16/22 showed residuals of an old right C8 radiculopathy (mild in degree electrically) and right carpal tunnel syndrome (very mild in degree electrically). I recommended wrist splint for the CTS. ***  MEDICATIONS:  Outpatient Encounter Medications as of 07/01/2023  Medication Sig   albuterol (VENTOLIN HFA) 108 (90 Base) MCG/ACT inhaler TAKE 2 PUFFS BY MOUTH EVERY 6 HOURS AS NEEDED FOR WHEEZE OR SHORTNESS OF BREATH   atorvastatin (LIPITOR) 20 MG tablet Take 1 tablet (20 mg total) by mouth daily.   B Complex Vitamins (B COMPLEX PO) Take by mouth daily.   Berberine Chloride (BERBERINE HCI PO) Take 500 mg by mouth daily.   CALCIUM PO Take 1,200 mg by mouth daily.   cholecalciferol (VITAMIN D3) 25 MCG (1000 UNIT) tablet Take 1,000 Units by mouth daily.   clobetasol cream (TEMOVATE) 0.05 % Apply topically as needed.   desvenlafaxine (PRISTIQ) 100 MG 24 hr tablet TAKE 1 TABLET BY MOUTH EVERY DAY   diclofenac (VOLTAREN) 75 MG EC tablet TAKE 1 TABLET BY MOUTH TWICE A DAY   fluconazole (DIFLUCAN) 150 MG tablet Take by mouth.   hydrochlorothiazide (HYDRODIURIL) 25 MG tablet TAKE 1 TABLET (25 MG TOTAL) BY MOUTH DAILY.   levothyroxine (SYNTHROID) 50 MCG tablet Take 1 tablet (50 mcg total) by  mouth daily.   losartan (COZAAR) 25 MG tablet Take 1 tablet (25 mg total) by mouth daily.   Potassium Chloride ER 20 MEQ TBCR TAKE 1 TABLET (20 MEQ) BY MOUTH DAILY.   Prenatal Vit-Fe Fumarate-FA (PRENATAL MULTIVITAMIN) TABS tablet Take 1 tablet by mouth daily at 12 noon.   PSYLLIUM HUSK PO Take by mouth as needed.   sodium chloride (OCEAN) 0.65 % SOLN nasal spray Place 1 spray into both nostrils as needed for  congestion.   Spacer/Aero-Holding Chambers DEVI 1 Device by Does not apply route daily as needed.   traZODone (DESYREL) 50 MG tablet Take 0.5-1 tablets (25-50 mg total) by mouth at bedtime as needed.   No facility-administered encounter medications on file as of 07/01/2023.    PAST MEDICAL HISTORY: Past Medical History:  Diagnosis Date   Anxiety    Chronic bronchitis (HCC)    Depression    Dyspnea    occasional with exertion   Fibroids, intramural    Goiter    History of blood transfusion 1964   Hyperlipidemia    Hypertension    Hypothyroidism    Open wound of right breast 10/03/2020   Osteopenia 01/2018   T score -1.3 FRAX 3.8% / 0.5%   Pneumonia    Thyroid disease    Tobacco use 05/29/2017    PAST SURGICAL HISTORY: Past Surgical History:  Procedure Laterality Date   ABDOMINAL HYSTERECTOMY     07/2017-Dr Rossi-Chapel Zelia Yzaguirre per patient; done for fibroids   BREAST REDUCTION SURGERY Bilateral 08/03/2020   Procedure: BREAST REDUCTION WITH LIPOSUCTION;  Surgeon: Peggye Form, DO;  Location: [facility];  Service: Plastics;  Laterality: Bilateral;  3.5 hours   BUNIONECTOMY Right    CARDIAC ELECTROPHYSIOLOGY STUDY AND ABLATION  2010   hx tachycardia    EUS N/A 08/26/2022   Procedure: LOWER ENDOSCOPIC ULTRASOUND (EUS);  Surgeon: Lemar Lofty., MD;  Location: [facility];  Service: Gastroenterology;  Laterality: N/A;  forward viewing, linear radial, mini probe   FLEXIBLE SIGMOIDOSCOPY N/A 08/26/2022   Procedure: FLEXIBLE SIGMOIDOSCOPY;  Surgeon: Meridee Score Netty Starring., MD;  Location: [facility];  Service: Gastroenterology;  Laterality: N/A;   LAPAROTOMY N/A 06/26/2017   Procedure: EXPLORATORY LAPAROTOMY;  Surgeon: Adolphus Birchwood, MD;  Location: [facility];  Service: Gynecology;  Laterality: N/A;   LESION EXCISION WITH COMPLEX REPAIR Right 04/03/2022   Procedure: Scar contracture release right breast with fat grafting;  Surgeon: Peggye Form, DO;   Location: [facility];  Service: Plastics;  Laterality: Right;   LIPOSUCTION WITH LIPOFILLING Bilateral 04/03/2022   Procedure: LIPOSUCTION WITH LIPOFILLING;  Surgeon: Peggye Form, DO;  Location: [facility];  Service: Plastics;  Laterality: Bilateral;   THYROID LOBECTOMY Left 06/18/2019   Procedure: LEFT THYROID LOBECTOMY;  Surgeon: Darnell Level, MD;  Location: [facility];  Service: General;  Laterality: Left;   TONSILLECTOMY  1967   TUBAL LIGATION     X2,1 REVERSAL    ALLERGIES: Allergies  Allergen Reactions   Qsymia [Phentermine-Topiramate] Nausea And Vomiting   Wellbutrin [Bupropion] Other (See Comments)    Made her sick    FAMILY HISTORY: Family History  Problem Relation Age of Onset   Hypertension Mother    Renal Disease Mother    Diabetes Father    Hypertension Father    Renal Disease Father        on dialysis   Thyroid disease Maternal Aunt    Stroke Maternal Grandmother    Breast cancer Maternal Grandmother  diagnosed in her 29's   Cancer Paternal Grandmother        ?   Healthy Daughter    Healthy Son    Colon polyps Neg Hx    Colon cancer Neg Hx    Esophageal cancer Neg Hx    Rectal cancer Neg Hx    Stomach cancer Neg Hx     SOCIAL HISTORY: Social History   Tobacco Use   Smoking status: Former    Current packs/day: 0.00    Average packs/day: 0.3 packs/day for 30.0 years (7.5 ttl pk-yrs)    Types: Cigarettes    Start date: 06/16/1989    Quit date: 06/17/2019    Years since quitting: 4.0   Smokeless tobacco: Never  Vaping Use   Vaping status: Never Used  Substance Use Topics   Alcohol use: Not Currently    Comment: 1-2 x a month   Drug use: Yes    Types: Marijuana    Comment: Occas - last use 03/21/22  occasional edible   Social History   Social History Narrative   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 Christian, no church currenty      Lifestyle: "poor diet"; no exercise   Are  you right handed or left handed? right   Are you currently employed ? yes   What is your current occupation?    Do you live at home alone? yes   Who lives with you?    What type of home do you live in: 1 story or 2 story? two    Caffeine a lot a day      Objective:  Vital Signs:  LMP 06/29/2017   ***  Labs and Imaging review: New results: 02/25/23: BMP unremarkable Mg 1.8 Lipid panel: Component     Latest Ref Rng 02/25/2023  Cholesterol     0 - 200 mg/dL 161   Triglycerides     0.0 - 149.0 mg/dL 09.6   HDL Cholesterol     >39.00 mg/dL 04.54   VLDL     0.0 - 40.0 mg/dL 09.8   LDL (calc)     0 - 99 mg/dL 119 (H)   Total CHOL/HDL Ratio 3   NonHDL 125.38     EMG (12/16/22): NCV & EMG Findings: Extensive electrodiagnostic evaluation of the right upper limb shows: Right median, ulnar, and radial sensory responses are within normal limits. Right median-ulnar palmar sensory nerve responses showed an abnormal peak latency difference (0.62 ms). Right median (APB) and ulnar (ADM) sensory responses are within normal limits. Chronic motor axon loss changes without evidence of active denervation changes are seen in the right first dorsal interosseous and right extensor indicis proprius muscles. All other tested muscles are within normal limits with normal motor unit configuration and recruitment patterns.   Impression: This is an abnormal study. 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 Evidence of a right median mononeuropathy at or distal to the wrist, consistent with carpal tunnel syndrome, as evidenced by abnormal median-ulnar palmar peak latency difference, very mild in degree electrically. Screening studies for right ulnar or radial mononeuropathies are normal.  Previously reviewed results: CBC (11/29/22) unremarkable Iron studies (11/29/22):  significant for ferritin of 9 TSH (09/18/22): 1.25  Assessment/Plan:  This is Marzella Schlein,  a 62 y.o. female with: ***   Plan: ***  Return to clinic in ***  Total time spent reviewing records, interview, history/exam, documentation, and coordination of care on day of encounter:  *** min  Jacquelyne Balint, MD

## 2023-07-01 ENCOUNTER — Ambulatory Visit: Payer: 59 | Admitting: Neurology

## 2023-07-12 ENCOUNTER — Encounter (HOSPITAL_COMMUNITY): Payer: Self-pay

## 2023-07-12 ENCOUNTER — Ambulatory Visit (HOSPITAL_COMMUNITY)
Admission: RE | Admit: 2023-07-12 | Discharge: 2023-07-12 | Disposition: A | Discharge status: Home / Self Care | Payer: Self-pay | Source: Ambulatory Visit | Attending: Emergency Medicine | Admitting: Emergency Medicine

## 2023-07-12 ENCOUNTER — Ambulatory Visit (INDEPENDENT_AMBULATORY_CARE_PROVIDER_SITE_OTHER): Payer: 59

## 2023-07-12 ENCOUNTER — Other Ambulatory Visit: Payer: Self-pay | Admitting: Family Medicine

## 2023-07-12 VITALS — BP 118/83 | HR 91 | Temp 97.9°F | Resp 14

## 2023-07-12 DIAGNOSIS — S92512A Displaced fracture of proximal phalanx of left lesser toe(s), initial encounter for closed fracture: Secondary | ICD-10-CM

## 2023-07-12 MED ORDER — HYDROCODONE-ACETAMINOPHEN 7.5-325 MG PO TABS: 1.0000 | ORAL_TABLET | Freq: Three times a day (TID) | ORAL | 0 refills | Status: DC

## 2023-07-12 MED ORDER — HYDROCODONE-ACETAMINOPHEN 5-325 MG PO TABS: 2.0000 | ORAL_TABLET | Freq: Four times a day (QID) | ORAL | 0 refills | Status: DC | PRN

## 2023-07-12 NOTE — ED Triage Notes (Signed)
Patient c/o left 3rd and 4th toe pain x 3 days. Patient states she stumped her toes on the couch.  Patient states she has been taking Ibuprofen. The last dose was yesterday.

## 2023-07-12 NOTE — Discharge Instructions (Addendum)
Wear the post-op shoe while standing/walking Keep toe buddy taped for support  I have sent a couple tablets of Norco to the pharmacy to use for breakthrough pain. Please note this is a narcotic medication. It has addictive properties and should be used sparingly. It will make you drowsy. Do not drink, drive, or make judgement decisions if you choose to take this medication.  Please follow up with your foot specialist at your appointment next week!

## 2023-07-12 NOTE — ED Provider Notes (Signed)
[facility]    CSN: [id number] Arrival date & time: 07/12/23  1317      History   Chief Complaint Chief Complaint  Patient presents with   Foot Pain    Stump left foot 4th toe is sore & swollen constant pain since Wednesday - Entered by patient   Toe Pain    HPI Ariel [name] is a 62 y.o. female.  3 days ago stubbed her left foot on the couch. Having pain and swelling at 4th toe. Hurts to bear weight. 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 02/25/2023   Cervical radiculopathy at C8 02/25/2023   Hypokalemia 06/28/2022   Scar contracture 02/26/2022   Postoperative breast asymmetry 01/23/2022   S/P bilateral breast reduction 09/17/2020   Back pain 05/02/2020   Neck pain 05/02/2020   Symptomatic mammary hypertrophy 05/02/2020   Primary osteoarthritis of right knee 08/26/2019   Left thyroid nodule 03/12/2019   Right knee pain 12/01/2018   Carpal tunnel syndrome on right 01/22/2018   Mood disorder (HCC) 01/22/2018   Hypertension 05/29/2017   Hyperlipidemia 05/29/2017   Hyperglycemia 05/29/2017   Aortic atherosclerosis (HCC) 05/29/2017   Goiter 04/24/2017    Past Surgical History:  Procedure Laterality Date   ABDOMINAL HYSTERECTOMY     07/2017-Dr [place] per patient; done for fibroids   BREAST REDUCTION SURGERY Bilateral 08/03/2020   Procedure: BREAST REDUCTION WITH  LIPOSUCTION;  Surgeon: Peggye Form, DO;  Location: [facility];  Service: Plastics;  Laterality: Bilateral;  3.5 hours   BUNIONECTOMY Right    CARDIAC ELECTROPHYSIOLOGY STUDY AND ABLATION  2010   hx tachycardia    EUS N/A 08/26/2022   Procedure: LOWER ENDOSCOPIC ULTRASOUND (EUS);  Surgeon: Lemar Lofty., MD;  Location: [facility];  Service: Gastroenterology;  Laterality: N/A;  forward viewing, linear radial, mini probe   FLEXIBLE SIGMOIDOSCOPY N/A 08/26/2022   Procedure: FLEXIBLE SIGMOIDOSCOPY;  Surgeon: Meridee Score Netty Starring., MD;  Location: [facility];  Service: Gastroenterology;  Laterality: N/A;   LAPAROTOMY N/A 06/26/2017   Procedure: EXPLORATORY LAPAROTOMY;  Surgeon: Adolphus Birchwood, MD;  Location: [facility];  Service: Gynecology;  Laterality: N/A;   LESION EXCISION WITH COMPLEX REPAIR Right 04/03/2022   Procedure: Scar contracture release right breast with fat grafting;  Surgeon: Peggye Form, DO;  Location: [facility];  Service: Plastics;  Laterality: Right;   LIPOSUCTION WITH LIPOFILLING Bilateral 04/03/2022   Procedure: LIPOSUCTION WITH LIPOFILLING;  Surgeon: Peggye Form, DO;  Location: [facility];  Service: Plastics;  Laterality: Bilateral;   THYROID LOBECTOMY Left 06/18/2019   Procedure: LEFT THYROID LOBECTOMY;  Surgeon: Darnell Level, MD;  Location: [facility];  Service: General;  Laterality: Left;   TONSILLECTOMY  1967   TUBAL LIGATION     X2,1 REVERSAL  OB History     Gravida  2   Para  2   Term      Preterm      AB      Living  2      SAB      IAB      Ectopic      Multiple      Live Births               Home Medications    Prior to Admission medications   Medication Sig Start Date End Date Taking? Authorizing Provider  HYDROcodone-acetaminophen (NORCO) 7.5-325 MG tablet Take 1 tablet by mouth every 8 (eight) hours. 07/12/23  Yes Math Brazie, Lurena Joiner, PA-C  albuterol (VENTOLIN HFA) 108 (90 Base) MCG/ACT inhaler TAKE  2 PUFFS BY MOUTH EVERY 6 HOURS AS NEEDED FOR WHEEZE OR SHORTNESS OF BREATH 06/12/23   Karie Georges, MD  atorvastatin (LIPITOR) 20 MG tablet Take 1 tablet (20 mg total) by mouth daily. 04/14/23   Karie Georges, MD  B Complex Vitamins (B COMPLEX PO) Take by mouth daily.    [provider]  CALCIUM PO Take 1,200 mg by mouth daily.    [provider]  cholecalciferol (VITAMIN D3) 25 MCG (1000 UNIT) tablet Take 1,000 Units by mouth daily.    [provider]  desvenlafaxine (PRISTIQ) 100 MG 24 hr tablet TAKE 1 TABLET BY MOUTH EVERY DAY 05/21/23   Karie Georges, MD  diclofenac (VOLTAREN) 75 MG EC tablet TAKE 1 TABLET BY MOUTH TWICE A DAY 06/17/23   Karie Georges, MD  hydrochlorothiazide (HYDRODIURIL) 25 MG tablet TAKE 1 TABLET (25 MG TOTAL) BY MOUTH DAILY. 05/20/23   Karie Georges, MD  levothyroxine (SYNTHROID) 50 MCG tablet Take 1 tablet (50 mcg total) by mouth daily. 09/18/22   Shamleffer, Konrad Dolores, MD  losartan (COZAAR) 25 MG tablet Take 1 tablet (25 mg total) by mouth daily. 02/25/23   Karie Georges, MD  Potassium Chloride ER 20 MEQ TBCR TAKE 1 TABLET (20 MEQ) BY MOUTH DAILY. 05/20/23   Karie Georges, MD  Prenatal Vit-Fe Fumarate-FA (PRENATAL MULTIVITAMIN) TABS tablet Take 1 tablet by mouth daily at 12 noon.    [provider]  sodium chloride (OCEAN) 0.65 % SOLN nasal spray Place 1 spray into both nostrils as needed for congestion.    [provider]  Spacer/Aero-Holding Chambers DEVI 1 Device by Does not apply route daily as needed. 03/20/23   Karie Georges, MD  traZODone (DESYREL) 50 MG tablet Take 0.5-1 tablets (25-50 mg total) by mouth at bedtime as needed. 07/08/22   Karie Georges, MD    Family History Family History  Problem Relation Age of Onset   Hypertension Mother    Renal Disease Mother    Diabetes Father    Hypertension Father    Renal Disease Father        on dialysis   Thyroid disease  Maternal Aunt    Stroke Maternal Grandmother    Breast cancer Maternal Grandmother        diagnosed in her 26's   Cancer Paternal Grandmother        ?   Healthy Daughter    Healthy Son    Colon polyps Neg Hx    Colon cancer Neg Hx    Esophageal cancer Neg Hx    Rectal cancer Neg Hx    Stomach cancer Neg Hx     Social History Social  History   Tobacco Use   Smoking status: Former    Current packs/day: 0.00    Average packs/day: 0.3 packs/day for 30.0 years (7.5 ttl pk-yrs)    Types: Cigarettes    Start date: 06/16/1989    Quit date: 06/17/2019    Years since quitting: 4.0   Smokeless tobacco: Never  Vaping Use   Vaping status: Never Used  Substance Use Topics   Alcohol use: Not Currently    Comment: 1-2 x a month   Drug use: Yes    Types: Marijuana    Comment: Occas - last use 03/21/22  occasional edible     Allergies   Qsymia [phentermine-topiramate] and Wellbutrin [bupropion]   Review of Systems Review of Systems Per HPI  Physical Exam Triage Vital Signs ED Triage Vitals [07/12/23 1406]  Encounter Vitals Group     BP 118/83     Systolic BP Percentile      Diastolic BP Percentile      Pulse Rate 91     Resp 14     Temp 97.9 F (36.6 C)     Temp Source Oral     SpO2 95 %     Weight      Height      Head Circumference      Peak Flow      Pain Score 7     Pain Loc      Pain Education      Exclude from Growth Chart    No data found.  Updated Vital Signs BP 118/83 (BP Location: Left Arm)   Pulse 91   Temp 97.9 F (36.6 C) (Oral)   Resp 14   LMP 06/29/2017   SpO2 95%    Physical Exam Vitals and nursing note reviewed.  Constitutional:      General: She is not in acute distress. HENT:     Mouth/Throat:     Pharynx: Oropharynx is clear.  Cardiovascular:     Rate and Rhythm: Normal rate and regular rhythm.     Pulses: Normal pulses.  Pulmonary:     Effort: Pulmonary effort is normal.  Musculoskeletal:     Cervical back: Normal range of  motion.     Left foot: Decreased range of motion (toes). Normal capillary refill. Tenderness present. No swelling or deformity. Normal pulse.     Comments: Decreased ROM at toes due to pain. Full ROM at ankle. Tender over 4th toe. No obvious swelling or deformity. No skin changes. Cap refill < 2 seconds, DP pulse 2+. Sensation normal   Skin:    Capillary Refill: Capillary refill takes less than 2 seconds.  Neurological:     Mental Status: She is alert and oriented to person, place, and time.      UC Treatments / Results  Labs (all labs ordered are listed, but only abnormal results are displayed) Labs Reviewed - No data to display  EKG  Radiology DG Foot Complete Left  Result Date: 07/12/2023 CLINICAL DATA:  Left foot injury 3 days ago with persistent swelling. Injury to third and fourth toes. EXAM: LEFT FOOT - COMPLETE 3+ VIEW COMPARISON:  None Available. FINDINGS: Examination demonstrates an oblique minimally displaced fracture through the mid to distal aspect of the fourth proximal phalanx. Minimal hallux valgus deformity present. Minimal degenerative changes of the first MTP joint. Remainder of the exam is unremarkable. IMPRESSION: Minimally displaced fracture of the fourth proximal phalanx. Electronically Signed   By: Elberta Fortis M.D.  On: 07/12/2023 14:49    Procedures Procedures  Medications Ordered in UC Medications - No data to display  Initial Impression / Assessment and Plan / UC Course  I have reviewed the triage vital signs and the nursing notes.  Pertinent labs & imaging results that were available during my care of the patient were reviewed by me and considered in my medical decision making (see chart for details).  Xray with fracture at distal phalanx, 4th toe left foot. Images independently reviewed by me, agree with radiology interpretation. Buddy taped 4th and 5th toes, post-op shoe placed PDMP reviewed. Sent a few tablets norco to pharmacy for breakthrough  pain, discussed precautions. Pharmacy was out of the 5-325 mg so resent as 7.5-325 mg. Patient already has upcoming appointment with podiatry in 5 days, will follow up then Will return if needed  Final Clinical Impressions(s) / UC Diagnoses   Final diagnoses:  Closed displaced fracture of proximal phalanx of lesser toe of left foot, initial encounter     Discharge Instructions      Wear the post-op shoe while standing/walking Keep toe buddy taped for support  I have sent a couple tablets of Norco to the pharmacy to use for breakthrough pain. Please note this is a narcotic medication. It has addictive properties and should be used sparingly. It will make you drowsy. Do not drink, drive, or make judgement decisions if you choose to take this medication.  Please follow up with your foot specialist at your appointment next week!     ED Prescriptions     Medication Sig Dispense Auth. Provider   HYDROcodone-acetaminophen (NORCO/VICODIN) 5-325 MG tablet  (Status: Discontinued) Take 2 tablets by mouth every 6 (six) hours as needed for up to 3 days. 5 tablet Analyce Tavares, PA-C   HYDROcodone-acetaminophen (NORCO) 7.5-325 MG tablet Take 1 tablet by mouth every 8 (eight) hours. 5 tablet Yeny Schmoll, Lurena Joiner, PA-C      I have reviewed the PDMP during this encounter.   Marlow Baars, [place] 07/12/23 1642

## 2023-07-17 ENCOUNTER — Encounter: Payer: Self-pay | Admitting: Podiatry

## 2023-07-17 ENCOUNTER — Ambulatory Visit: Payer: 59 | Admitting: Podiatry

## 2023-07-17 DIAGNOSIS — M2012 Hallux valgus (acquired), left foot: Secondary | ICD-10-CM

## 2023-07-17 NOTE — Progress Notes (Signed)
Subjective:  Patient ID: Ariel [name], female    DOB: 11/29/1960,  MRN: [id number] HPI Chief Complaint  Patient presents with   Toe Injury    4th toe left - ran into the couch 07/09/23 and banged toe, urgent care xrayed-fractured the toe, feels better today, has questions about bunion left too, had right bunion corrected years ago   New Patient (Initial Visit)    62 y.o. female presents with the above complaint.   ROS: Denies fever chills nausea vomit muscle aches pains calf pain back pain chest pain shortness of breath.  She states that the fourth toe of the left foot is hardly painful but I was already making an appointment for the bunion deformity.  I had this right 1 fixed years ago and it did not turn out very well.  I have also had pain in this left on now for several years and it appears to be getting worse.  Has tried different steroid and nonsteroid anti-inflammatory.  Has not tried different shoe gear stretching exercises ice therapy and different shoe gear keep the medial aspect from rubbing.  Back in a month  Past Medical History:  Diagnosis Date   Anxiety    Chronic bronchitis (HCC)    Depression    Dyspnea    occasional with exertion   Fibroids, intramural    Goiter    History of blood transfusion 1964   Hyperlipidemia    Hypertension    Hypothyroidism    Open wound of right breast 10/03/2020   Osteopenia 01/2018   T score -1.3 FRAX 3.8% / 0.5%   Pneumonia    Thyroid disease    Tobacco use 05/29/2017   Past Surgical History:  Procedure Laterality Date   ABDOMINAL HYSTERECTOMY     07/2017-Dr [place] per patient; done for fibroids   BREAST REDUCTION SURGERY Bilateral 08/03/2020   Procedure: BREAST REDUCTION WITH LIPOSUCTION;  Surgeon: Peggye Form, DO;  Location: [facility];  Service: Plastics;  Laterality: Bilateral;  3.5 hours   BUNIONECTOMY Right    CARDIAC ELECTROPHYSIOLOGY STUDY AND ABLATION  2010   hx tachycardia     EUS N/A 08/26/2022   Procedure: LOWER ENDOSCOPIC ULTRASOUND (EUS);  Surgeon: Lemar Lofty., MD;  Location: [facility];  Service: Gastroenterology;  Laterality: N/A;  forward viewing, linear radial, mini probe   FLEXIBLE SIGMOIDOSCOPY N/A 08/26/2022   Procedure: FLEXIBLE SIGMOIDOSCOPY;  Surgeon: Meridee Score Netty Starring., MD;  Location: [facility];  Service: Gastroenterology;  Laterality: N/A;   LAPAROTOMY N/A 06/26/2017   Procedure: EXPLORATORY LAPAROTOMY;  Surgeon: Adolphus Birchwood, MD;  Location: [facility];  Service: Gynecology;  Laterality: N/A;   LESION EXCISION WITH COMPLEX REPAIR Right 04/03/2022   Procedure: Scar contracture release right breast with fat grafting;  Surgeon: Peggye Form, DO;  [facility];  Service: Plastics;  Laterality: Right;   LIPOSUCTION WITH LIPOFILLING Bilateral 04/03/2022   Procedure: LIPOSUCTION WITH LIPOFILLING;  Surgeon: Peggye Form, DO;  Location: [facility];  Service: Plastics;  Laterality: Bilateral;   THYROID LOBECTOMY Left 06/18/2019   Procedure: LEFT THYROID LOBECTOMY;  Surgeon: Darnell Level, MD;  Location: [facility];  Service: General;  Laterality: Left;   TONSILLECTOMY  1967   TUBAL LIGATION     X2,1 REVERSAL    Current Outpatient Medications:    meloxicam (MOBIC) 15 MG tablet, Take 15 mg by mouth daily., Disp: , Rfl:    albuterol (VENTOLIN HFA) 108 (90 Base) MCG/ACT inhaler, TAKE 2 PUFFS  BY MOUTH EVERY 6 HOURS AS NEEDED FOR WHEEZE OR SHORTNESS OF BREATH, Disp: 6.7 each, Rfl: 2   atorvastatin (LIPITOR) 20 MG tablet, TAKE 1 TABLET BY MOUTH EVERY DAY, Disp: 90 tablet, Rfl: 1   B Complex Vitamins (B COMPLEX PO), Take by mouth daily., Disp: , Rfl:    CALCIUM PO, Take 1,200 mg by mouth daily., Disp: , Rfl:    cholecalciferol (VITAMIN D3) 25 MCG (1000 UNIT) tablet, Take 1,000 Units by mouth daily., Disp: , Rfl:    desvenlafaxine (PRISTIQ) 100 MG 24 hr tablet, TAKE 1 TABLET BY MOUTH EVERY DAY, Disp: 90 tablet, Rfl: 1   diclofenac (VOLTAREN)  75 MG EC tablet, TAKE 1 TABLET BY MOUTH TWICE A DAY, Disp: 60 tablet, Rfl: 2   hydrochlorothiazide (HYDRODIURIL) 25 MG tablet, TAKE 1 TABLET (25 MG TOTAL) BY MOUTH DAILY., Disp: 90 tablet, Rfl: 1   HYDROcodone-acetaminophen (NORCO) 7.5-325 MG tablet, Take 1 tablet by mouth every 8 (eight) hours., Disp: 5 tablet, Rfl: 0   levothyroxine (SYNTHROID) 50 MCG tablet, Take 1 tablet (50 mcg total) by mouth daily., Disp: 90 tablet, Rfl: 3   losartan (COZAAR) 25 MG tablet, Take 1 tablet (25 mg total) by mouth daily., Disp: 90 tablet, Rfl: 1   Potassium Chloride ER 20 MEQ TBCR, TAKE 1 TABLET (20 MEQ) BY MOUTH DAILY., Disp: 90 tablet, Rfl: 1   Prenatal Vit-Fe Fumarate-FA (PRENATAL MULTIVITAMIN) TABS tablet, Take 1 tablet by mouth daily at 12 noon., Disp: , Rfl:    Spacer/Aero-Holding Rudean Curt, 1 Device by Does not apply route daily as needed., Disp: 1 each, Rfl: 0   traZODone (DESYREL) 50 MG tablet, Take 0.5-1 tablets (25-50 mg total) by mouth at bedtime as needed., Disp: 30 tablet, Rfl: 0  Allergies  Allergen Reactions   Qsymia [Phentermine-Topiramate] Nausea And Vomiting   Wellbutrin [Bupropion] Other (See Comments)    Made her sick   Review of Systems Objective:  There were no vitals filed for this visit.  General: Well developed, nourished, in no acute distress, alert and oriented x3   Dermatological: Skin is warm, dry and supple bilateral. Nails x 10 are well maintained; remaining integument appears unremarkable at this time. There are no open sores, no preulcerative lesions, no rash or signs of infection present.  Vascular: Dorsalis Pedis artery and Posterior Tibial artery pedal pulses are 2/4 bilateral with immedate capillary fill time. Pedal hair growth present. No varicosities and no lower extremity edema present bilateral.   Neruologic: Grossly intact via light touch bilateral. Vibratory intact via tuning fork bilateral. Protective threshold with Semmes Wienstein monofilament intact to  all pedal sites bilateral. Patellar and Achilles deep tendon reflexes 2+ bilateral. No Babinski or clonus noted bilateral.   Musculoskeletal: No gross boney pedal deformities bilateral. No pain, crepitus, or limitation noted with foot and ankle range of motion bilateral. Muscular strength 5/5 in all groups tested bilateral.  Gait: Unassisted, Nonantalgic.    Radiographs:  Radiographs were reviewed today from the ED which did demonstrate an oblique fracture of the proximal phalanx fourth digit of the left foot.  She also has a hypertrophic medial condyle of the first metatarsal left foot.  With an increase in the first intermetatarsal angle which is increased greater than normal value.  Hallux abductus angle is also increased greater than normal value.  There is mild joint space narrowing consistent with some early osteoarthritic change.  Other than the forefoot pathology there does not seem to be any rear foot or ankle  pathology associated with this.  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  Fracture fourth digit left foot  Plan: Discussed etiology pathology conservative surgical therapies at this point performed a consent to consent her for a [facility] bunion repair with screw fixation.  She understands this and is amenable to it.  We did discuss the possible postop complications which may include but are not limited to postop pain bleeding swelling infection recurrence need for further surgery overcorrection under correction loss of digit loss limb loss of life.  She signed all 3 pages a consent form placed her in a cam boot for the fracture toe and for postop period.     Laurynn Mccorvey T. [place], [place]

## 2023-08-04 ENCOUNTER — Telehealth: Payer: Self-pay | Admitting: Podiatry

## 2023-08-04 NOTE — Telephone Encounter (Signed)
DOS- 08/22/2023  [place] BUNIONECTOMY LT - 16109  [employer] EFFECTIVE DATE- 10/21/2018  DEDUCTIBLE- $500.00 WITH REMAINING $141.56  OOP- $2000.00 WITH REMAINING $1,496.56  COINSURANCE- 20%  SPOKE WITH NIGEL W. FROM [employer], AND HE STATED THAT FOR CPT CODE 60454 PRIOR AUTHORIZATION IS NOT REQUIRED.   CALL REFERENCE NUMBER: NIGEL W. 08/04/2023 9:36 AM EST

## 2023-08-20 ENCOUNTER — Other Ambulatory Visit: Payer: Self-pay | Admitting: Podiatry

## 2023-08-20 MED ORDER — CEPHALEXIN 500 MG PO CAPS: 500.0000 mg | ORAL_CAPSULE | Freq: Three times a day (TID) | ORAL | 0 refills | Status: DC

## 2023-08-20 MED ORDER — ONDANSETRON HCL 4 MG PO TABS: 4.0000 mg | ORAL_TABLET | Freq: Three times a day (TID) | ORAL | 0 refills | Status: DC | PRN

## 2023-08-20 MED ORDER — OXYCODONE-ACETAMINOPHEN 10-325 MG PO TABS
1.0000 | ORAL_TABLET | Freq: Three times a day (TID) | ORAL | 0 refills | Status: AC | PRN
Start: 2023-08-20 — End: 2023-08-27

## 2023-08-21 ENCOUNTER — Telehealth: Payer: Self-pay

## 2023-08-21 NOTE — Telephone Encounter (Addendum)
PA request received from covermymeds for oxycodone 10/325mg  tablet. Pa was initiated and approved. Approved 08/21/2023-09/20/2023

## 2023-08-22 DIAGNOSIS — M2012 Hallux valgus (acquired), left foot: Secondary | ICD-10-CM | POA: Diagnosis not present

## 2023-08-26 ENCOUNTER — Encounter: Payer: Self-pay | Admitting: Podiatry

## 2023-08-26 ENCOUNTER — Ambulatory Visit (INDEPENDENT_AMBULATORY_CARE_PROVIDER_SITE_OTHER): Payer: 59

## 2023-08-26 ENCOUNTER — Ambulatory Visit (INDEPENDENT_AMBULATORY_CARE_PROVIDER_SITE_OTHER): Payer: 59 | Admitting: Podiatry

## 2023-08-26 DIAGNOSIS — M2012 Hallux valgus (acquired), left foot: Secondary | ICD-10-CM | POA: Diagnosis not present

## 2023-08-26 DIAGNOSIS — Z9889 Other specified postprocedural states: Secondary | ICD-10-CM

## 2023-08-27 NOTE — Progress Notes (Signed)
She presents today for her first postop visit date of surgery 08/22/2023.  She denies fever chills nausea vomiting muscle aches and pains to her left foot she states I have not had any pain at all.  She is status post [place] bunion pair with screw left.  Objective: She presents today with her cam boot was removed demonstrates a dry sterile dressing was removed demonstrates mildly edematous surgical foot particularly in the first intermetatarsal space sutures are intact margins well coapted she has good range of motion dorsiflexion and plantarflexion.  Radiographs taken today demonstrate osseously mature individual capital osteotomy with single screw fixation is intact and in good position lateral view does demonstrate some dorsal edema.  Assessment: Well-healing surgical foot.  Plan: Redressed the foot today dressed a compressive dressing I have encouraged her range of motion's which I demonstrated to her she will do this but she will continue to wear the cam boot anytime she is walking.  Follow-up with me in 1 to 2 weeks

## 2023-08-28 ENCOUNTER — Encounter: Payer: 59 | Admitting: Family Medicine

## 2023-08-28 ENCOUNTER — Encounter: Payer: 59 | Admitting: Podiatry

## 2023-08-29 ENCOUNTER — Other Ambulatory Visit: Payer: Self-pay | Admitting: Family Medicine

## 2023-08-29 DIAGNOSIS — J4520 Mild intermittent asthma, uncomplicated: Secondary | ICD-10-CM

## 2023-09-02 NOTE — Telephone Encounter (Signed)
Inbound call from patient stating that she is due for a EUS. Patient has recall. Requesting a call to discuss scheduling. Please advise.

## 2023-09-03 ENCOUNTER — Other Ambulatory Visit: Payer: Self-pay

## 2023-09-03 DIAGNOSIS — K621 Rectal polyp: Secondary | ICD-10-CM

## 2023-09-03 NOTE — Telephone Encounter (Signed)
Lower EUS has been set up for 09/11/23 at 315 pm at [facility] with GM   Lower EUS scheduled, pt instructed and medications reviewed.  Patient instructions mailed to home.  Patient to call with any questions or concerns.

## 2023-09-03 NOTE — Telephone Encounter (Signed)
Inbound call from patient, following up on call below.

## 2023-09-05 ENCOUNTER — Encounter (HOSPITAL_COMMUNITY): Payer: Self-pay | Admitting: Gastroenterology

## 2023-09-07 ENCOUNTER — Other Ambulatory Visit: Payer: Self-pay | Admitting: Family Medicine

## 2023-09-07 DIAGNOSIS — I10 Essential (primary) hypertension: Secondary | ICD-10-CM

## 2023-09-10 NOTE — Anesthesia Preprocedure Evaluation (Signed)
Anesthesia Evaluation  Patient identified by MRN, date of birth, ID band Patient awake    Reviewed: Allergy & Precautions, NPO status , Patient's Chart, lab work & pertinent test results  Airway Mallampati: I  TM Distance: >3 FB Neck ROM: Full    Dental  (+) Dental Advisory Given, Partial Upper   Pulmonary shortness of breath, pneumonia, former smoker Quit smoking 2020   Pulmonary exam normal breath sounds clear to auscultation       Cardiovascular hypertension, Pt. on medications Normal cardiovascular exam Rhythm:Regular Rate:Normal     Neuro/Psych  PSYCHIATRIC DISORDERS Anxiety Depression    negative neurological ROS     GI/Hepatic ,,,(+)       marijuana useRectal polyp    Endo/Other  Hypothyroidism    Renal/GU negative Renal ROS     Musculoskeletal  (+) Arthritis , Osteoarthritis,    Abdominal  (+) + obese  Peds  Hematology negative hematology ROS (+)   Anesthesia Other Findings   Reproductive/Obstetrics                             Anesthesia Physical Anesthesia Plan  ASA: 2  Anesthesia Plan: MAC   Post-op Pain Management: Minimal or no pain anticipated   Induction:   PONV Risk Score and Plan: 2 and Propofol infusion, TIVA and Treatment may vary due to age or medical condition  Airway Management Planned: Natural Airway and Simple Face Mask  Additional Equipment: None  Intra-op Plan:   Post-operative Plan:   Informed Consent: I have reviewed the patients History and Physical, chart, labs and discussed the procedure including the risks, benefits and alternatives for the proposed anesthesia with the patient or authorized representative who has indicated his/her understanding and acceptance.     Dental advisory given  Plan Discussed with: CRNA  Anesthesia Plan Comments:        Anesthesia Quick Evaluation

## 2023-09-11 ENCOUNTER — Ambulatory Visit (INDEPENDENT_AMBULATORY_CARE_PROVIDER_SITE_OTHER): Payer: 59 | Admitting: Podiatry

## 2023-09-11 ENCOUNTER — Ambulatory Visit (HOSPITAL_COMMUNITY)
Admission: RE | Admit: 2023-09-11 | Discharge: 2023-09-11 | Disposition: A | Discharge status: Home / Self Care | Payer: 59 | Attending: Gastroenterology | Admitting: Gastroenterology

## 2023-09-11 ENCOUNTER — Ambulatory Visit (HOSPITAL_COMMUNITY): Payer: 59 | Admitting: Anesthesiology

## 2023-09-11 ENCOUNTER — Encounter: Payer: Self-pay | Admitting: Podiatry

## 2023-09-11 ENCOUNTER — Encounter (HOSPITAL_COMMUNITY)
Admission: RE | Disposition: A | Discharge status: Home / Self Care | Payer: Self-pay | Source: Home / Self Care | Attending: Gastroenterology

## 2023-09-11 ENCOUNTER — Encounter (HOSPITAL_COMMUNITY): Payer: Self-pay | Admitting: Gastroenterology

## 2023-09-11 DIAGNOSIS — I899 Noninfective disorder of lymphatic vessels and lymph nodes, unspecified: Secondary | ICD-10-CM

## 2023-09-11 DIAGNOSIS — Z09 Encounter for follow-up examination after completed treatment for conditions other than malignant neoplasm: Secondary | ICD-10-CM | POA: Diagnosis present

## 2023-09-11 DIAGNOSIS — D127 Benign neoplasm of rectosigmoid junction: Secondary | ICD-10-CM | POA: Diagnosis not present

## 2023-09-11 DIAGNOSIS — Z87891 Personal history of nicotine dependence: Secondary | ICD-10-CM | POA: Insufficient documentation

## 2023-09-11 DIAGNOSIS — K5730 Diverticulosis of large intestine without perforation or abscess without bleeding: Secondary | ICD-10-CM

## 2023-09-11 DIAGNOSIS — I10 Essential (primary) hypertension: Secondary | ICD-10-CM | POA: Diagnosis not present

## 2023-09-11 DIAGNOSIS — Q439 Congenital malformation of intestine, unspecified: Secondary | ICD-10-CM

## 2023-09-11 DIAGNOSIS — C7A1 Malignant poorly differentiated neuroendocrine tumors: Secondary | ICD-10-CM | POA: Diagnosis not present

## 2023-09-11 DIAGNOSIS — D3A8 Other benign neuroendocrine tumors: Secondary | ICD-10-CM

## 2023-09-11 DIAGNOSIS — Z9889 Other specified postprocedural states: Secondary | ICD-10-CM

## 2023-09-11 DIAGNOSIS — K635 Polyp of colon: Secondary | ICD-10-CM | POA: Diagnosis not present

## 2023-09-11 DIAGNOSIS — M2012 Hallux valgus (acquired), left foot: Secondary | ICD-10-CM | POA: Diagnosis not present

## 2023-09-11 DIAGNOSIS — K6289 Other specified diseases of anus and rectum: Secondary | ICD-10-CM

## 2023-09-11 DIAGNOSIS — K640 First degree hemorrhoids: Secondary | ICD-10-CM | POA: Diagnosis not present

## 2023-09-11 DIAGNOSIS — K621 Rectal polyp: Secondary | ICD-10-CM

## 2023-09-11 HISTORY — PX: FLEXIBLE SIGMOIDOSCOPY: SHX5431

## 2023-09-11 HISTORY — PX: EUS: SHX5427

## 2023-09-11 HISTORY — PX: POLYPECTOMY: SHX5525

## 2023-09-11 SURGERY — ULTRASOUND, LOWER GI TRACT, ENDOSCOPIC
Anesthesia: Monitor Anesthesia Care

## 2023-09-11 MED ORDER — PHENYLEPHRINE 80 MCG/ML (10ML) SYRINGE FOR IV PUSH (FOR BLOOD PRESSURE SUPPORT)
PREFILLED_SYRINGE | INTRAVENOUS | Status: DC | PRN
  Administered 2023-09-11 (×3): 80 ug via INTRAVENOUS

## 2023-09-11 MED ORDER — SODIUM CHLORIDE 0.9 % IV SOLN: INTRAVENOUS | Status: DC | PRN

## 2023-09-11 MED ORDER — LIDOCAINE HCL (CARDIAC) PF 100 MG/5ML IV SOSY
PREFILLED_SYRINGE | INTRAVENOUS | Status: DC | PRN
  Administered 2023-09-11: 80 mg via INTRATRACHEAL

## 2023-09-11 MED ORDER — DEXMEDETOMIDINE HCL IN NACL 80 MCG/20ML IV SOLN
INTRAVENOUS | Status: DC | PRN
  Administered 2023-09-11: 6 ug via INTRAVENOUS

## 2023-09-11 MED ORDER — PROPOFOL 500 MG/50ML IV EMUL
INTRAVENOUS | Status: DC | PRN
  Administered 2023-09-11: 30 mg via INTRAVENOUS
  Administered 2023-09-11: 75 ug/kg/min via INTRAVENOUS

## 2023-09-11 NOTE — Transfer of Care (Signed)
Immediate Anesthesia Transfer of Care Note  Patient: Ariel [name]  Procedure(s) Performed: LOWER ENDOSCOPIC ULTRASOUND (EUS) FLEXIBLE SIGMOIDOSCOPY POLYPECTOMY  Patient Location: PACU and Endoscopy Unit  Anesthesia Type:MAC  Level of Consciousness: awake and alert   Airway & Oxygen Therapy: Patient Spontanous Breathing and Patient connected to nasal cannula oxygen  Post-op Assessment: Report given to RN and Post -op Vital signs reviewed and stable  Post vital signs: Reviewed and stable  Last Vitals:  Vitals Value Taken Time  BP    Temp    Pulse 80 09/11/23 1357  Resp 17 09/11/23 1357  SpO2 100 % 09/11/23 1357  Vitals shown include unfiled device data.  Last Pain:  Vitals:   09/11/23 1230  TempSrc: Temporal  PainSc: 0-No pain         Complications: No notable events documented.

## 2023-09-11 NOTE — Discharge Instructions (Signed)

## 2023-09-11 NOTE — Op Note (Signed)
[facility] Patient Name: Ariel [name] Procedure Date: 09/11/2023 MRN: [id number] Attending MD: Corliss Parish , MD, [id number] Date of Birth: Sep 14, 1961 CSN: [id number] Age: 62 Admit Type: Outpatient Procedure:                Lower EUS Indications:              Neuroendocrine Tumor Providers:                Corliss Parish, MD, Norman Clay, RN, Harrington Challenger,                            Technician Referring MD:             Carie Caddy. Pyrtle, MD Medicines:                Monitored Anesthesia Care Complications:            No immediate complications. Estimated Blood Loss:     Estimated blood loss was minimal. Procedure:                Pre-Anesthesia Assessment:                           - Prior to the procedure, a History and Physical                            was performed, and patient medications and                            allergies were reviewed. The patient's tolerance of                            previous anesthesia was also reviewed. The risks                            and benefits of the procedure and the sedation                            options and risks were discussed with the patient.                            All questions were answered, and informed consent                            was obtained. Prior Anticoagulants: The patient has                            taken no anticoagulant or antiplatelet agents                            except for NSAID medication. ASA Grade Assessment:                            II - A patient with mild systemic disease. After  reviewing the risks and benefits, the patient was                            deemed in satisfactory condition to undergo the                            procedure.                           After obtaining informed consent, the endoscope was                            passed under direct vision. Throughout the                            procedure, the patient's blood  pressure, pulse, and                            oxygen saturations were monitored continuously. The                            [id number] ([id number]) Olympus therapeutic endoscope                            was introduced through the anus and advanced to the                            the sigmoid colon. The [id number] ([id number])                            Olympus radial ultrasound scope was introduced                            through the anus and advanced to the the                            rectosigmoid junction for ultrasound. The lower EUS                            was accomplished without difficulty. The patient                            tolerated the procedure. The quality of the bowel                            preparation was fair. Findings:      The digital rectal exam was normal. Pertinent negatives include no       palpable rectal lesions.      ENDOSCOPIC FINDING: :      Many small-mouthed diverticula were found in the recto-sigmoid colon and       sigmoid colon leading to tortuosity of the left colon.      A 3 mm polyp was found in the recto-sigmoid colon. The polyp was       sessile. The polyp was removed with a cold  snare. Resection and       retrieval were complete.      Normal mucosa was found in the rest of the visualized colon.      Non-bleeding non-thrombosed internal hemorrhoids were found during       retroflexion, during perianal exam and during digital exam. The       hemorrhoids were Grade I (internal hemorrhoids that do not prolapse).      ENDOSONOGRAPHIC FINDING: :      The rectum was normal.      An anechoic lesion suggestive of a cyst was identified in the perirectal       space (found approximately 7 cm from the anal os). The lesion measured       19 mm by 20 mm. There was a single compartment without septae. The outer       wall of the lesion was thin. There was no associated mass. There was no       internal debris within the fluid-filled cavity.       No malignant-appearing lymph nodes were visualized in the perirectal       region and in the left iliac region.      The internal anal sphincter was visualized endosonographically and       appeared normal. Impression:               FLEX Impression:                           - Preparation of the colon was fair.                           - Diverticulosis in the recto-sigmoid colon and in                            the sigmoid colon.                           - One 3 mm polyp at the recto-sigmoid colon,                            removed with a cold snare. Resected and retrieved.                           - Normal mucosa in the rest of visualized left                            colon.                           - Non-bleeding non-thrombosed internal hemorrhoids.                           EUS Impression:                           - Endosonographic images of the rectum were                            unremarkable.                           -  A lesion suggestive of a cyst was visualized                            endosonographically in the perirectal space. There                            was no associated mass. Tissue has not been                            obtained. However, the endosonographic appearance                            is suggestive of benign finding (though this was                            not previously seen on last year's endoscopic                            ultrasound or on CTAP.                           - No malignant-appearing lymph nodes were                            visualized endosonographically in the perirectal                            region and in the left iliac region.                           - The internal anal sphincter was visualized                            endosonographically and appeared normal. Moderate Sedation:      Not Applicable - Patient had care per Anesthesia. Recommendation:           - The patient will be observed post-procedure,                             until all discharge criteria are met.                           - Discharge patient to home.                           - Patient has a contact number available for                            emergencies. The signs and symptoms of potential                            delayed complications were discussed with the                            patient. Return to normal  activities tomorrow.                            Written discharge instructions were provided to the                            patient.                           - Resume previous diet.                           - Repeat lower EUS in 2 years for follow-up of                            rectal neuroendocrine tumor.                           - Consideration of repeat CT pelvis versus MRI                            pelvis to evaluate this vs transvaginal ultrasound                            could be considered for further                            visualization/impression (nonurgent).                           - The findings and recommendations were discussed                            with the patient.                           - The findings and recommendations were discussed                            with the patient's family. Procedure Code(s):        --- Professional ---                           [phone], Sigmoidoscopy, flexible; with endoscopic                            ultrasound examination                           [phone], Sigmoidoscopy, flexible; with removal of                            tumor(s), polyp(s), or other lesion(s) by snare                            technique Diagnosis Code(s):        --- Professional ---  K64.0, First degree hemorrhoids                           D12.7, Benign neoplasm of rectosigmoid junction                           K62.89, Other specified diseases of anus and rectum                           I89.9, Noninfective disorder of lymphatic vessels                             and lymph nodes, unspecified                           K57.30, Diverticulosis of large intestine without                            perforation or abscess without bleeding CPT copyright 2022 American Medical Association. All rights reserved. The codes documented in this report are preliminary and upon coder review may  be revised to meet current compliance requirements. Corliss Parish, MD 09/11/2023 2:06:12 PM Number of Addenda: 0

## 2023-09-11 NOTE — Progress Notes (Signed)
He presents today date of surgery was August 22, 2023.  [place] bunion repair with screw left foot.  She states is doing great has not had any pain only ever took 1 pain medication.  States that the antibiotics made her stomach upset.  Objective: Vital signs are stable alert oriented x 3 there is no erythema edema cellulitis drainage or odor she has her sutures are intact she has great range of motion of the first metatarsophalangeal joint both passive and actively.  Assessment: Well-healing surgical foot.  Plan: We are going to place her in a Ace bandage with her regular socks and I did remove her stitches today.  I am going to put her in a Darco shoe.  And allow her to ambulate more she is to only ambulate in the surgical shoe I will follow-up with her in about 3 weeks at which time we will consider another set of x-rays and possibly getting into her regular shoe gear.

## 2023-09-11 NOTE — H&P (Signed)
GASTROENTEROLOGY PROCEDURE H&P NOTE   Primary Care Physician: Karie Georges, MD  HPI: Ariel [name] is a 62 y.o. female who presents for Lower EUS for follow-up of rectal neuroendocrine tumor.  Past Medical History:  Diagnosis Date   Anxiety    Chronic bronchitis (HCC)    Depression    Dyspnea    occasional with exertion   Fibroids, intramural    Goiter    History of blood transfusion 1964   Hyperlipidemia    Hypertension    Hypothyroidism    Open wound of right breast 10/03/2020   Osteopenia 01/2018   T score -1.3 FRAX 3.8% / 0.5%   Pneumonia    Thyroid disease    Tobacco use 05/29/2017   Past Surgical History:  Procedure Laterality Date   ABDOMINAL HYSTERECTOMY     07/2017-Dr [place] per patient; done for fibroids   BREAST REDUCTION SURGERY Bilateral 08/03/2020   Procedure: BREAST REDUCTION WITH LIPOSUCTION;  Surgeon: Peggye Form, DO;  Location: [facility];  Service: Plastics;  Laterality: Bilateral;  3.5 hours   BUNIONECTOMY Right    CARDIAC ELECTROPHYSIOLOGY STUDY AND ABLATION  2010   hx tachycardia    EUS N/A 08/26/2022   Procedure: LOWER ENDOSCOPIC ULTRASOUND (EUS);  Surgeon: Lemar Lofty., MD;  Location: [facility];  Service: Gastroenterology;  Laterality: N/A;  forward viewing, linear radial, mini probe   FLEXIBLE SIGMOIDOSCOPY N/A 08/26/2022   Procedure: FLEXIBLE SIGMOIDOSCOPY;  Surgeon: Meridee Score Netty Starring., MD;  Location: [facility];  Service: Gastroenterology;  Laterality: N/A;   LAPAROTOMY N/A 06/26/2017   Procedure: EXPLORATORY LAPAROTOMY;  Surgeon: Adolphus Birchwood, MD;  Location: [facility];  Service: Gynecology;  Laterality: N/A;   LESION EXCISION WITH COMPLEX REPAIR Right 04/03/2022   Procedure: Scar contracture release right breast with fat grafting;  Surgeon: Peggye Form, DO;  Location: [facility];  Service: Plastics;  Laterality: Right;   LIPOSUCTION WITH LIPOFILLING Bilateral 04/03/2022    Procedure: LIPOSUCTION WITH LIPOFILLING;  Surgeon: Peggye Form, DO;  Location: [facility];  Service: Plastics;  Laterality: Bilateral;   THYROID LOBECTOMY Left 06/18/2019   Procedure: LEFT THYROID LOBECTOMY;  Surgeon: Darnell Level, MD;  Location: [facility];  Service: General;  Laterality: Left;   TONSILLECTOMY  1967   TUBAL LIGATION     X2,1 REVERSAL   No current facility-administered medications for this encounter.   No current facility-administered medications for this encounter. Allergies  Allergen Reactions   Other Nausea Only    Antibiotic that was for foot surgery    Qsymia [Phentermine-Topiramate] Nausea And Vomiting   Wellbutrin [Bupropion] Other (See Comments)    Made her sick   Family History  Problem Relation Age of Onset   Hypertension Mother    Renal Disease Mother    Diabetes Father    Hypertension Father    Renal Disease Father        on dialysis   Thyroid disease Maternal Aunt    Stroke Maternal Grandmother    Breast cancer Maternal Grandmother        diagnosed in her 82's   Cancer Paternal Grandmother        ?   Healthy Daughter    Healthy Son    Colon polyps Neg Hx    Colon cancer Neg Hx    Esophageal cancer Neg Hx    Rectal cancer Neg Hx    Stomach cancer Neg Hx    Social History   Socioeconomic History  Marital status: Single    Spouse name: Not on file   Number of children: Not on file   Years of education: Not on file   Highest education level: Some college, no degree  Occupational History   Not on file  Tobacco Use   Smoking status: Former    Current packs/day: 0.00    Average packs/day: 0.3 packs/day for 30.0 years (7.5 ttl pk-yrs)    Types: Cigarettes    Start date: 06/16/1989    Quit date: 06/17/2019    Years since quitting: 4.2   Smokeless tobacco: Never  Vaping Use   Vaping status: Never Used  Substance and Sexual Activity   Alcohol use: Not Currently    Comment: 1-2 x a month   Drug use: Yes    Types: Marijuana    Comment:  Occas - last use 03/21/22  occasional edible   Sexual activity: Yes    Partners: Male    Birth control/protection: Surgical    Comment: Hyst, 1ST INTERCOURSE- 18, PARTNERS- 22, CURRENT PARTNER- 3.5 YRS  Other Topics Concern   Not on file  Social History Narrative   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 Christian, no church currenty      Lifestyle: "poor diet"; no exercise   Are you right handed or left handed? right   Are you currently employed ? yes   What is your current occupation?    Do you live at home alone? yes   Who lives with you?    What type of home do you live in: 1 story or 2 story? two    Caffeine a lot a day   Social Determinants of Health   Financial Resource Strain: High Risk (09/22/2022)   Overall Financial Resource Strain (CARDIA)    Difficulty of Paying Living Expenses: Very hard  Food Insecurity: Food Insecurity Present (09/22/2022)   Hunger Vital Sign    Worried About Running Out of Food in the Last Year: Not on file    Ran Out of Food in the Last Year: Often true  Transportation Needs: No Transportation Needs (09/22/2022)   PRAPARE - Administrator, Civil Service (Medical): No    Lack of Transportation (Non-Medical): No  Physical Activity: Unknown (09/22/2022)   Exercise Vital Sign    Days of Exercise per Week: 0 days    Minutes of Exercise per Session: Not on file  Stress: Stress Concern Present (09/22/2022)   [employer] of Occupational Health - Occupational Stress Questionnaire    Feeling of Stress : Very much  Social Connections: Unknown (09/22/2022)   Social Connection and Isolation Panel [NHANES]    Frequency of Communication with Friends and Family: Once a week    Frequency of Social Gatherings with Friends and Family: Never    Attends Religious Services: Patient declined    Database administrator or Organizations: No    Attends Engineer, structural: Not on  file    Marital Status: Divorced  Intimate Partner Violence: Not on file    Physical Exam: Today's Vitals   09/05/23 1057 09/11/23 1230  BP:  (!) 169/93  Pulse:  72  Resp:  13  Temp:  (!) 97 F (36.1 C)  TempSrc:  Temporal  SpO2:  100%  Weight: 77.1 kg 77.1 kg  Height: 5\' 3"  (1.6 m) 5\' 3"  (1.6 m)  PainSc:  0-No  pain   Body mass index is 30.11 kg/m. GEN: NAD EYE: Sclerae anicteric ENT: MMM CV: Non-tachycardic GI: Soft, NT/ND NEURO:  Alert & Oriented x 3  Lab Results: No results for input(s): "WBC", "HGB", "HCT", "PLT" in the last 72 hours. BMET No results for input(s): "NA", "K", "CL", "CO2", "GLUCOSE", "BUN", "CREATININE", "CALCIUM" in the last 72 hours. LFT No results for input(s): "PROT", "ALBUMIN", "AST", "ALT", "ALKPHOS", "BILITOT", "BILIDIR", "IBILI" in the last 72 hours. PT/INR No results for input(s): "LABPROT", "INR" in the last 72 hours.   Impression / Plan: This is a 62 y.o.female who presents for Lower EUS for follow-up of rectal neuroendocrine tumor.  The risks and benefits of endoscopic evaluation/treatment were discussed with the patient and/or family; these include but are not limited to the risk of perforation, infection, bleeding, missed lesions, lack of diagnosis, severe illness requiring hospitalization, as well as anesthesia and sedation related illnesses.  The patient's history has been reviewed, patient examined, no change in status, and deemed stable for procedure.  The patient and/or family is agreeable to proceed.    Corliss Parish, MD [facility] Office # [phone]

## 2023-09-12 ENCOUNTER — Telehealth: Payer: Self-pay

## 2023-09-12 ENCOUNTER — Other Ambulatory Visit: Payer: Self-pay

## 2023-09-12 DIAGNOSIS — K6289 Other specified diseases of anus and rectum: Secondary | ICD-10-CM

## 2023-09-12 DIAGNOSIS — K621 Rectal polyp: Secondary | ICD-10-CM

## 2023-09-12 LAB — SURGICAL PATHOLOGY

## 2023-09-12 NOTE — Telephone Encounter (Signed)
-----   Message from Carie Caddy Pyrtle sent at 09/12/2023 10:27 AM EST ----- Regarding: RE: Followup Thank you Yes I agree with the pelvic MRI  Bonita Quin this can be ordered Thanks JMP ----- Message ----- From: Lemar Lofty., MD Sent: 09/11/2023   2:17 PM EST To: Beverley Fiedler, MD Subject: Followup                                       JMP, Please see EUS report. Overall, no significant/concerning findings from a GI perspective. There is a small peri-rectal cyst which I hadn't appreciated previously and was not in the previous Pelvic CT report. I recommend a Pelvic CT vs Pelvic MRI. If you agree, then we can work on getting that scheduled. But from a GI perspective, I would recommend a 2-year followup, unless the RS polyp were to be something more than expected. Thanks. GM

## 2023-09-12 NOTE — Telephone Encounter (Signed)
Order entered for MR pelvis and rad scheduling to contact pt to set up appt.

## 2023-09-12 NOTE — Anesthesia Postprocedure Evaluation (Signed)
Anesthesia Post Note  Patient: Ariel [name]  Procedure(s) Performed: LOWER ENDOSCOPIC ULTRASOUND (EUS) FLEXIBLE SIGMOIDOSCOPY POLYPECTOMY     Patient location during evaluation: Endoscopy Anesthesia Type: MAC Level of consciousness: awake and alert Pain management: pain level controlled Vital Signs Assessment: post-procedure vital signs reviewed and stable Respiratory status: spontaneous breathing Cardiovascular status: stable Anesthetic complications: no   No notable events documented.  Last Vitals:  Vitals:   09/11/23 1417 09/11/23 1420  BP: 119/60 119/60  Pulse: 77 70  Resp: 12 11  Temp:    SpO2: 100% 98%    Last Pain:  Vitals:   09/11/23 1417  TempSrc:   PainSc: 0-No pain                 Lewie Loron

## 2023-09-14 ENCOUNTER — Encounter (HOSPITAL_COMMUNITY): Payer: Self-pay | Admitting: Gastroenterology

## 2023-09-16 ENCOUNTER — Encounter: Payer: Self-pay | Admitting: Gastroenterology

## 2023-09-22 ENCOUNTER — Ambulatory Visit (HOSPITAL_COMMUNITY)
Admission: RE | Admit: 2023-09-22 | Discharge: 2023-09-22 | Disposition: A | Discharge status: Home / Self Care | Payer: 59 | Source: Ambulatory Visit | Attending: Internal Medicine | Admitting: Internal Medicine

## 2023-09-22 ENCOUNTER — Ambulatory Visit (INDEPENDENT_AMBULATORY_CARE_PROVIDER_SITE_OTHER): Payer: 59 | Admitting: Family Medicine

## 2023-09-22 ENCOUNTER — Encounter: Payer: Self-pay | Admitting: Family Medicine

## 2023-09-22 ENCOUNTER — Ambulatory Visit: Payer: 59 | Admitting: Internal Medicine

## 2023-09-22 VITALS — BP 122/88 | HR 100 | Temp 98.6°F | Ht 64.0 in | Wt 165.7 lb

## 2023-09-22 DIAGNOSIS — K6289 Other specified diseases of anus and rectum: Secondary | ICD-10-CM | POA: Diagnosis present

## 2023-09-22 DIAGNOSIS — F39 Unspecified mood [affective] disorder: Secondary | ICD-10-CM

## 2023-09-22 DIAGNOSIS — E039 Hypothyroidism, unspecified: Secondary | ICD-10-CM | POA: Diagnosis not present

## 2023-09-22 DIAGNOSIS — K621 Rectal polyp: Secondary | ICD-10-CM | POA: Diagnosis present

## 2023-09-22 DIAGNOSIS — E611 Iron deficiency: Secondary | ICD-10-CM | POA: Diagnosis not present

## 2023-09-22 DIAGNOSIS — M159 Polyosteoarthritis, unspecified: Secondary | ICD-10-CM

## 2023-09-22 DIAGNOSIS — I10 Essential (primary) hypertension: Secondary | ICD-10-CM

## 2023-09-22 DIAGNOSIS — Z1231 Encounter for screening mammogram for malignant neoplasm of breast: Secondary | ICD-10-CM

## 2023-09-22 DIAGNOSIS — Z0000 Encounter for general adult medical examination without abnormal findings: Secondary | ICD-10-CM | POA: Diagnosis not present

## 2023-09-22 DIAGNOSIS — Z23 Encounter for immunization: Secondary | ICD-10-CM

## 2023-09-22 DIAGNOSIS — R739 Hyperglycemia, unspecified: Secondary | ICD-10-CM

## 2023-09-22 LAB — HEMOGLOBIN A1C: Hgb A1c MFr Bld: 5.5 % (ref 4.6–6.5)

## 2023-09-22 LAB — TSH: TSH: 1.31 u[IU]/mL (ref 0.35–5.50)

## 2023-09-22 MED ORDER — GADOBUTROL 1 MMOL/ML IV SOLN
7.0000 mL | Freq: Once | INTRAVENOUS | Status: AC | PRN
  Administered 2023-09-22: 7 mL via INTRAVENOUS

## 2023-09-22 MED ORDER — DICLOFENAC SODIUM 75 MG PO TBEC: 75.0000 mg | DELAYED_RELEASE_TABLET | Freq: Two times a day (BID) | ORAL | 2 refills | Status: DC

## 2023-09-22 MED ORDER — HYDROCHLOROTHIAZIDE 25 MG PO TABS: 25.0000 mg | ORAL_TABLET | Freq: Every day | ORAL | 1 refills | Status: DC

## 2023-09-22 MED ORDER — DESVENLAFAXINE SUCCINATE ER 100 MG PO TB24: 100.0000 mg | ORAL_TABLET | Freq: Every day | ORAL | 1 refills | Status: DC

## 2023-09-22 NOTE — Patient Instructions (Addendum)
Probiotics  Health Maintenance, Female Adopting a healthy lifestyle and getting preventive care are important in promoting health and wellness. Ask your health care provider about: The right schedule for you to have regular tests and exams. Things you can do on your own to prevent diseases and keep yourself healthy. What should I know about diet, weight, and exercise? Eat a healthy diet  Eat a diet that includes plenty of vegetables, fruits, low-fat dairy products, and lean protein. Do not eat a lot of foods that are high in solid fats, added sugars, or sodium. Maintain a healthy weight Body mass index (BMI) is used to identify weight problems. It estimates body fat based on height and weight. Your health care provider can help determine your BMI and help you achieve or maintain a healthy weight. Get regular exercise Get regular exercise. This is one of the most important things you can do for your health. Most adults should: Exercise for at least 150 minutes each week. The exercise should increase your heart rate and make you sweat (moderate-intensity exercise). Do strengthening exercises at least twice a week. This is in addition to the moderate-intensity exercise. Spend less time sitting. Even light physical activity can be beneficial. Watch cholesterol and blood lipids Have your blood tested for lipids and cholesterol at 60 years of age, then have this test every 5 years. Have your cholesterol levels checked more often if: Your lipid or cholesterol levels are high. You are older than 60 years of age. You are at high risk for heart disease. What should I know about cancer screening? Depending on your health history and family history, you may need to have cancer screening at various ages. This may include screening for: Breast cancer. Cervical cancer. Colorectal cancer. Skin cancer. Lung cancer. What should I know about heart disease, diabetes, and high blood pressure? Blood  pressure and heart disease High blood pressure causes heart disease and increases the risk of stroke. This is more likely to develop in people who have high blood pressure readings or are overweight. Have your blood pressure checked: Every 3-5 years if you are 40-57 years of age. Every year if you are 50 years old or older. Diabetes Have regular diabetes screenings. This checks your fasting blood sugar level. Have the screening done: Once every three years after age 26 if you are at a normal weight and have a low risk for diabetes. More often and at a younger age if you are overweight or have a high risk for diabetes. What should I know about preventing infection? Hepatitis B If you have a higher risk for hepatitis B, you should be screened for this virus. Talk with your health care provider to find out if you are at risk for hepatitis B infection. Hepatitis C Testing is recommended for: Everyone born from [birth year] through 1963. Anyone with known risk factors for hepatitis C. Sexually transmitted infections (STIs) Get screened for STIs, including gonorrhea and chlamydia, if: You are sexually active and are younger than 60 years of age. You are older than 60 years of age and your health care provider tells you that you are at risk for this type of infection. Your sexual activity has changed since you were last screened, and you are at increased risk for chlamydia or gonorrhea. Ask your health care provider if you are at risk. Ask your health care provider about whether you are at high risk for HIV. Your health care provider may recommend a prescription medicine to help  prevent HIV infection. If you choose to take medicine to prevent HIV, you should first get tested for HIV. You should then be tested every 3 months for as long as you are taking the medicine. Pregnancy If you are about to stop having your period (premenopausal) and you may become pregnant, seek counseling before you get  pregnant. Take 400 to 800 micrograms (mcg) of folic acid every day if you become pregnant. Ask for birth control (contraception) if you want to prevent pregnancy. Osteoporosis and menopause Osteoporosis is a disease in which the bones lose minerals and strength with aging. This can result in bone fractures. If you are 23 years old or older, or if you are at risk for osteoporosis and fractures, ask your health care provider if you should: Be screened for bone loss. Take a calcium or vitamin D supplement to lower your risk of fractures. Be given hormone replacement therapy (HRT) to treat symptoms of menopause. Follow these instructions at home: Alcohol use Do not drink alcohol if: Your health care provider tells you not to drink. You are pregnant, may be pregnant, or are planning to become pregnant. If you drink alcohol: Limit how much you have to: 0-1 drink a day. Know how much alcohol is in your drink. In the [place] one drink equals one 12 oz bottle of beer (355 mL), one 5 oz glass of wine (148 mL), or one 1 oz glass of hard liquor (44 mL). Lifestyle Do not use any products that contain nicotine or tobacco. These products include cigarettes, chewing tobacco, and vaping devices, such as e-cigarettes. If you need help quitting, ask your health care provider. Do not use street drugs. Do not share needles. Ask your health care provider for help if you need support or information about quitting drugs. General instructions Schedule regular health, dental, and eye exams. Stay current with your vaccines. Tell your health care provider if: You often feel depressed. You have ever been abused or do not feel safe at home. Summary Adopting a healthy lifestyle and getting preventive care are important in promoting health and wellness. Follow your health care provider's instructions about healthy diet, exercising, and getting tested or screened for diseases. Follow your health care provider's  instructions on monitoring your cholesterol and blood pressure. This information is not intended to replace advice given to you by your health care provider. Make sure you discuss any questions you have with your health care provider. Document Revised: 02/26/2021 Document Reviewed: 02/26/2021 Elsevier Patient Education  2024 [employer].

## 2023-09-22 NOTE — Progress Notes (Unsigned)
Name: Ariel [name]  MRN/ DOB: [id number], December 18, 1960    Age/ Sex: 62 y.o., female     PCP: Karie Georges, MD   Reason for Endocrinology Evaluation: MNG     Initial Endocrinology Clinic Visit: 04/24/2017    PATIENT IDENTIFIER: [name] is a 62 y.o., female with a past medical history of MNG. She has followed with [facility] since 04/24/2017 for consultative assistance with management of her MNG.   HISTORICAL SUMMARY: The patient was first diagnosed with MNG in 2007. She is S/P benign FNA of the left nodule in 2018. She is S/P left lobectomy 05/2019 with benign pathology due to a large nodule > 4 cm at [facility]  Thyroid ultrasound 01/24/2020 revealed surgical changes of prior left hemithyroidectomy and no evidence of nodules in the remaining right thyroid gland.  Maternal Aunt with thyroid disease   Pt followed with Dr. Everardo All from 2018 until 07/2021  SUBJECTIVE:    Today (09/22/2023):  [name] is here for a follow up on hypothyroidism.   Weight has been stable   Has chronic constipation  Denies palpitations  Denies local neck swelling but has occasional dysphagia  Denies nausea or heartburn but has dry mouth    Levothyroxine 50 mcg daily   HISTORY:  Past Medical History:  Past Medical History:  Diagnosis Date   Anxiety    Chronic bronchitis (HCC)    Depression    Dyspnea    occasional with exertion   Fibroids, intramural    Goiter    History of blood transfusion 1964   Hyperlipidemia    Hypertension    Hypothyroidism    Open wound of right breast 10/03/2020   Osteopenia 01/2018   T score -1.3 FRAX 3.8% / 0.5%   Pneumonia    Thyroid disease    Tobacco use 05/29/2017   Past Surgical History:  Past Surgical History:  Procedure Laterality Date   ABDOMINAL HYSTERECTOMY     07/2017-Dr [place] per patient; done for fibroids   BREAST REDUCTION SURGERY Bilateral 08/03/2020   Procedure: BREAST REDUCTION WITH  LIPOSUCTION;  Surgeon: Peggye Form, DO;  Location: [facility];  Service: Plastics;  Laterality: Bilateral;  3.5 hours   BUNIONECTOMY Right    CARDIAC ELECTROPHYSIOLOGY STUDY AND ABLATION  2010   hx tachycardia    EUS N/A 08/26/2022   Procedure: LOWER ENDOSCOPIC ULTRASOUND (EUS);  Surgeon: Lemar Lofty., MD;  Location: [facility];  Service: Gastroenterology;  Laterality: N/A;  forward viewing, linear radial, mini probe   EUS N/A 09/11/2023   Procedure: LOWER ENDOSCOPIC ULTRASOUND (EUS);  Surgeon: Lemar Lofty., MD;  Location: [facility];  Service: Gastroenterology;  Laterality: N/A;   FLEXIBLE SIGMOIDOSCOPY N/A 08/26/2022   Procedure: FLEXIBLE SIGMOIDOSCOPY;  Surgeon: Meridee Score Netty Starring., MD;  Location: [facility];  Service: Gastroenterology;  Laterality: N/A;   FLEXIBLE SIGMOIDOSCOPY N/A 09/11/2023   Procedure: FLEXIBLE SIGMOIDOSCOPY;  Surgeon: Meridee Score Netty Starring., MD;  Location: [facility];  Service: Gastroenterology;  Laterality: N/A;   LAPAROTOMY N/A 06/26/2017   Procedure: EXPLORATORY LAPAROTOMY;  Surgeon: Adolphus Birchwood, MD;  Location: [facility];  Service: Gynecology;  Laterality: N/A;   LESION EXCISION WITH COMPLEX REPAIR Right 04/03/2022   Procedure: Scar contracture release right breast with fat grafting;  Surgeon: Peggye Form, DO;  Location: [facility];  Service: Plastics;  Laterality: Right;   LIPOSUCTION WITH LIPOFILLING Bilateral 04/03/2022   Procedure: LIPOSUCTION WITH LIPOFILLING;  Surgeon: Foster Simpson  S, DO;  Location: [facility];  Service: Plastics;  Laterality: Bilateral;   POLYPECTOMY  09/11/2023   Procedure: POLYPECTOMY;  Surgeon: Mansouraty, Netty Starring., MD;  Location: [facility];  Service: Gastroenterology;;   THYROID LOBECTOMY Left 06/18/2019   Procedure: LEFT THYROID LOBECTOMY;  Surgeon: Darnell Level, MD;  Location: [facility];  Service: General;  Laterality: Left;   TONSILLECTOMY  1967   TUBAL LIGATION     X2,1  REVERSAL   Social History:  reports that she quit smoking about 4 years ago. Her smoking use included cigarettes. She started smoking about 34 years ago. She has a 7.5 pack-year smoking history. She has never used smokeless tobacco. She reports that she does not currently use alcohol. She reports current drug use. Drug: Marijuana. Family History:  Family History  Problem Relation Age of Onset   Hypertension Mother    Renal Disease Mother    Diabetes Father    Hypertension Father    Renal Disease Father        on dialysis   Thyroid disease Maternal Aunt    Stroke Maternal Grandmother    Breast cancer Maternal Grandmother        diagnosed in her 17's   Cancer Paternal Grandmother        ?   Healthy Daughter    Healthy Son    Colon polyps Neg Hx    Colon cancer Neg Hx    Esophageal cancer Neg Hx    Rectal cancer Neg Hx    Stomach cancer Neg Hx      HOME MEDICATIONS: Allergies as of 09/22/2023       Reactions   Other Nausea Only   Antibiotic that was for foot surgery    Qsymia [phentermine-topiramate] Nausea And Vomiting   Wellbutrin [bupropion] Other (See Comments)   Made her sick        Medication List        Accurate as of September 22, 2023  6:55 AM. If you have any questions, ask your nurse or doctor.          albuterol 108 (90 Base) MCG/ACT inhaler Commonly known as: VENTOLIN HFA TAKE 2 PUFFS BY MOUTH EVERY 6 HOURS AS NEEDED FOR WHEEZE OR SHORTNESS OF BREATH   atorvastatin 20 MG tablet Commonly known as: LIPITOR TAKE 1 TABLET BY MOUTH EVERY DAY   B COMPLEX PO Take by mouth daily.   CALCIUM PO Take 1,200 mg by mouth daily.   cholecalciferol 25 MCG (1000 UNIT) tablet Commonly known as: VITAMIN D3 Take 1,000 Units by mouth daily.   desvenlafaxine 100 MG 24 hr tablet Commonly known as: PRISTIQ TAKE 1 TABLET BY MOUTH EVERY DAY   diclofenac 75 MG EC tablet Commonly known as: VOLTAREN TAKE 1 TABLET BY MOUTH TWICE A DAY   hydrochlorothiazide 25 MG  tablet Commonly known as: HYDRODIURIL TAKE 1 TABLET (25 MG TOTAL) BY MOUTH DAILY.   levothyroxine 50 MCG tablet Commonly known as: SYNTHROID Take 1 tablet (50 mcg total) by mouth daily.   losartan 25 MG tablet Commonly known as: COZAAR TAKE 1 TABLET (25 MG TOTAL) BY MOUTH DAILY.   Potassium Chloride ER 20 MEQ Tbcr TAKE 1 TABLET (20 MEQ) BY MOUTH DAILY.   prenatal multivitamin Tabs tablet Take 1 tablet by mouth daily at 12 noon.   Spacer/Aero-Holding [employer] 1 Device by Does not apply route daily as needed.   traZODone 50 MG tablet Commonly known as: DESYREL Take 0.5-1 tablets (25-50 mg total)  by mouth at bedtime as needed.          OBJECTIVE:   PHYSICAL EXAM: VS: LMP 06/29/2017    EXAM: General: Pt appears well and is in NAD  Eyes: External eye exam normal without stare, lid lag or exophthalmos.   Neck: General: Supple without adenopathy. Thyroid: No goiter or nodules appreciated. .  Lungs: Clear with good BS bilat with no rales, rhonchi, or wheezes  Heart: Auscultation: RRR.  Abdomen: Normoactive bowel sounds, soft, nontender, without masses or organomegaly palpable  Extremities:  BL LE: No pretibial edema normal ROM and strength.  Mental Status: Judgment, insight: Intact Orientation: Oriented to time, place, and person Mood and affect: No depression, anxiety, or agitation     DATA REVIEWED:   Latest Reference Range & Units 09/18/22 11:21  TSH 0.35 - 5.50 uIU/mL 1.25    Thyroid Ultrasound 01/24/2020 Parenchymal Echotexture: Mildly heterogenous   Isthmus: 0.2 cm   Right lobe: 5.2 x 1.5 x 1.8 cm   Left lobe: Surgically absent   _________________________________________________________   Estimated total number of nodules >/= 1 cm: 0   Number of spongiform nodules >/=  2 cm not described below (TR1): 0   Number of mixed cystic and solid nodules >/= 1.5 cm not described below (TR2): 0    _________________________________________________________   No discrete nodules are seen within the residual right-sided thyroid gland. No evidence of recurrent thyroid tissue or abnormal nodularity in the left thyroid resection bed. No suspicious lymphadenopathy.   IMPRESSION: 1. Surgical changes of prior left hemithyroidectomy without evidence of residual or recurrent thyroid tissue, nodularity or lymphadenopathy. 2. No evidence of nodule in the remaining right thyroid gland.     ASSESSMENT / PLAN / RECOMMENDATIONS:   Hypothyroidism:   - Pt is clinically euthyroid  - No local neck symptoms  - - Pt educated extensively on the correct way to take levothyroxine (first thing in the morning with water, 30 minutes before eating or taking other medications). - Pt encouraged to double dose the following day if she were to miss a dose given long half-life of levothyroxine.     Medications  Continue levothyroxine 50 mcg daily      F/U in 1 yr    Signed electronically by: Lyndle Herrlich, MD  [facility]  [facility] [address] [place] [place], [place] [address] Phone: [phone] FAX: [phone]      CC: Karie Georges, MD [address] [place] [address] Phone: [phone]  Fax: [phone]   Return to Endocrinology clinic as below: Future Appointments  Date Time Provider Department Center  09/22/2023  8:30 AM Amarria Andreasen, Konrad Dolores, MD [facility] None  09/22/2023 10:00 AM Karie Georges, MD [facility]  09/22/2023  3:00 PM [facility] [place]  10/02/2023 10:15 AM Elinor Parkinson, DPM [facility]

## 2023-09-22 NOTE — Progress Notes (Signed)
Complete physical exam  Patient: Ariel [name]   DOB: 09/18/1961   62 y.o. Female  MRN: [id number]  Subjective:    Chief Complaint  Patient presents with   Annual Exam    Ariel [name] is a 62 y.o. female who presents today for a complete physical exam. She reports consuming a general diet. The patient does not participate in regular exercise at present. She generally feels well. She reports sleeping fairly well. She does not have additional problems to discuss today.    Most recent fall risk assessment:    12/13/2022    7:51 AM  Fall Risk   Falls in the past year? 0  Number falls in past yr: 0  Injury with Fall? 0  Follow up Falls evaluation completed     Most recent depression screenings:    02/25/2023    8:18 AM 08/08/2022    2:38 PM  PHQ 2/9 Scores  PHQ - 2 Score 2   PHQ- 9 Score 10      Information is confidential and restricted. Go to Review Flowsheets to unlock data.    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 02/25/2023   Cervical radiculopathy at C8 02/25/2023   Hypokalemia 06/28/2022   Scar contracture 02/26/2022   Postoperative breast asymmetry 01/23/2022   S/P bilateral breast reduction 09/17/2020   Primary osteoarthritis of right knee 08/26/2019   Carpal tunnel syndrome on right 01/22/2018   Mood disorder (HCC) 01/22/2018   Hypertension 05/29/2017   Hyperlipidemia 05/29/2017   Hyperglycemia 05/29/2017      Patient Care Team: Karie Georges, MD as PCP - General (Family Medicine) Genia Del, MD as Consulting Physician (Obstetrics and Gynecology) Romero Belling, MD (Inactive) as Consulting Physician (Endocrinology)   Outpatient Medications Prior to Visit  Medication Sig   albuterol  (VENTOLIN HFA) 108 (90 Base) MCG/ACT inhaler TAKE 2 PUFFS BY MOUTH EVERY 6 HOURS AS NEEDED FOR WHEEZE OR SHORTNESS OF BREATH   atorvastatin (LIPITOR) 20 MG tablet TAKE 1 TABLET BY MOUTH EVERY DAY   B Complex Vitamins (B COMPLEX PO) Take by mouth daily.   CALCIUM PO Take 1,200 mg by mouth daily.   cholecalciferol (VITAMIN D3) 25 MCG (1000 UNIT) tablet Take 1,000 Units by mouth daily.   levothyroxine (SYNTHROID) 50 MCG tablet Take 1 tablet (50 mcg total) by mouth daily.   losartan (COZAAR) 25 MG tablet TAKE 1 TABLET (25 MG TOTAL) BY MOUTH DAILY.   Potassium Chloride ER 20 MEQ TBCR TAKE 1 TABLET (20 MEQ) BY MOUTH DAILY.   Prenatal Vit-Fe Fumarate-FA (PRENATAL MULTIVITAMIN) TABS tablet Take 1 tablet by mouth daily at 12 noon.   Spacer/Aero-Holding [employer] 1 Device by Does not apply route daily as needed.   traZODone (DESYREL) 50 MG tablet Take 0.5-1 tablets (25-50 mg total) by mouth at bedtime as needed.   [DISCONTINUED] desvenlafaxine (PRISTIQ) 100 MG 24 hr tablet TAKE 1 TABLET BY MOUTH EVERY DAY   [DISCONTINUED] diclofenac (VOLTAREN) 75 MG EC tablet TAKE 1 TABLET BY MOUTH TWICE A DAY   [DISCONTINUED] hydrochlorothiazide (HYDRODIURIL) 25 MG tablet TAKE 1 TABLET (25 MG TOTAL) BY MOUTH DAILY.   No facility-administered medications prior to visit.    Review of Systems  HENT:  Negative for hearing  loss.   Eyes:  Negative for blurred vision.  Respiratory:  Negative for shortness of breath.   Cardiovascular:  Negative for chest pain.  Gastrointestinal: Negative.   Genitourinary: Negative.   Musculoskeletal:  Negative for back pain.  Neurological:  Negative for headaches.  Psychiatric/Behavioral:  Negative for depression.        Objective:     BP 122/88 (BP Location: Left Arm, Patient Position: Sitting, Cuff Size: Normal)   Pulse 100   Temp 98.6 F (37 C) (Oral)   Ht 5\' 4"  (1.626 m)   Wt 165 lb 11.2 oz (75.2 kg)   LMP 06/29/2017   SpO2 99%   BMI 28.44 kg/m    Physical  Exam Vitals reviewed.  Constitutional:      Appearance: Normal appearance. She is well-groomed and normal weight.  HENT:     Right Ear: Tympanic membrane and ear canal normal.     Left Ear: Tympanic membrane and ear canal normal.     Mouth/Throat:     Mouth: Mucous membranes are moist.     Pharynx: No posterior oropharyngeal erythema.  Eyes:     Conjunctiva/sclera: Conjunctivae normal.  Neck:     Thyroid: No thyromegaly.  Cardiovascular:     Rate and Rhythm: Normal rate and regular rhythm.     Pulses: Normal pulses.     Heart sounds: S1 normal and S2 normal.  Pulmonary:     Effort: Pulmonary effort is normal.     Breath sounds: Normal breath sounds and air entry.  Abdominal:     General: Abdomen is flat. Bowel sounds are normal.     Palpations: Abdomen is soft.  Musculoskeletal:     Right lower leg: No edema.     Left lower leg: No edema.  Lymphadenopathy:     Cervical: No cervical adenopathy.  Neurological:     Mental Status: She is alert and oriented to person, place, and time. Mental status is at baseline.     Gait: Gait is intact.  Psychiatric:        Mood and Affect: Mood and affect normal.        Speech: Speech normal.        Behavior: Behavior normal.        Judgment: Judgment normal.      No results found for any visits on 09/22/23.     Assessment & Plan:    Routine Health Maintenance and Physical Exam  Immunization History  Administered Date(s) Administered   Influenza,inj,Quad PF,6+ Mos 07/21/2020, 08/15/2021, 06/27/2022   PFIZER(Purple Top)SARS-COV-2 Vaccination 01/26/2020, 02/16/2020, 10/11/2020   Pfizer Covid-19 Vaccine Bivalent Booster 65yrs & up 02/15/2023   RSV,unspecified 02/15/2023   Tdap 06/12/2017   Zoster Recombinant(Shingrix) 08/15/2021, 11/05/2021    Health Maintenance  Topic Date Due   MAMMOGRAM  03/30/2022   INFLUENZA VACCINE  05/22/2023   COVID-19 Vaccine (5 - 2023-24 season) 10/08/2023 (Originally 06/22/2023)   Colonoscopy   06/19/2025 (Originally 06/20/2023)   Cervical Cancer Screening (HPV/Pap Cotest)  09/24/2024   DTaP/Tdap/Td (2 - Td or Tdap) 06/13/2027   Hepatitis C Screening  Completed   HIV Screening  Completed   Zoster Vaccines- Shingrix  Completed   HPV VACCINES  Aged Out   Fecal DNA (Cologuard)  Discontinued    Discussed health benefits of physical activity, and encouraged her to engage in regular exercise appropriate for her age and condition.  Routine general medical examination at a health care facility      - Normal  physical exam findings on exam, counseled patient on healthy sleep habits. Handouts given on healthy eating and exercise, I recommended trying to increase exercise to 30 minutes 5 times a week.   Need for immunization against influenza -     Flu vaccine trivalent PF, 6mos and older(Flulaval,Afluria,Fluarix,Fluzone)  Iron deficiency -     Iron, TIBC and Ferritin Panel  Hypothyroidism, unspecified type -     TSH  Breast cancer screening by mammogram -     3D Screening Mammogram, Left and Right; Future  Hyperglycemia -     Hemoglobin A1c  Mood disorder (HCC) -     Desvenlafaxine Succinate ER; Take 1 tablet (100 mg total) by mouth daily.  Dispense: 90 tablet; Refill: 1  Primary hypertension -     hydroCHLOROthiazide; Take 1 tablet (25 mg total) by mouth daily.  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  Dispense: 60 tablet; Refill: 2    Return in 6 months (on 03/22/2024) for HTN.     Karie Georges, MD

## 2023-09-23 LAB — IRON,TIBC AND FERRITIN PANEL
%SAT: 24 % (ref 16–45)
Ferritin: 27 ng/mL (ref 16–232)
Iron: 95 ug/dL (ref 45–160)
TIBC: 404 ug/dL (ref 250–450)

## 2023-09-29 ENCOUNTER — Telehealth: Payer: Self-pay | Admitting: Internal Medicine

## 2023-09-29 NOTE — Telephone Encounter (Signed)
Patient called stated she had an MRI, seeking results.

## 2023-09-30 NOTE — Telephone Encounter (Signed)
Spoke with pt and let her know the scan has not bee read yet. She knows we will call when we have the results.

## 2023-09-30 NOTE — Telephone Encounter (Signed)
Patient is requesting a call to discuss previous message. Please advise.

## 2023-09-30 NOTE — Telephone Encounter (Signed)
Called radiology reading room to escalate the reading of the MRI.

## 2023-10-02 ENCOUNTER — Ambulatory Visit (INDEPENDENT_AMBULATORY_CARE_PROVIDER_SITE_OTHER): Payer: 59 | Admitting: Podiatry

## 2023-10-02 ENCOUNTER — Ambulatory Visit (INDEPENDENT_AMBULATORY_CARE_PROVIDER_SITE_OTHER): Payer: 59

## 2023-10-02 ENCOUNTER — Telehealth: Payer: Self-pay | Admitting: Family Medicine

## 2023-10-02 DIAGNOSIS — M2012 Hallux valgus (acquired), left foot: Secondary | ICD-10-CM

## 2023-10-02 DIAGNOSIS — Z9889 Other specified postprocedural states: Secondary | ICD-10-CM

## 2023-10-02 NOTE — Telephone Encounter (Signed)
Mychart message sent.

## 2023-10-02 NOTE — Progress Notes (Signed)
She presents today date of surgery 08/22/2023.  She states that he feels pretty good just concerned about the swelling.  States that she has been on it a fair amount.  Tries to keep it up is much as she can and ices it by the end of the day.  Objective: Vital signs are stable alert oriented x 3.  There is no erythema just some mild edema no cellulitis drainage or odor she does have some postinflammatory hyperpigmentation around the surgical site and the metatarsophalangeal joints dorsally.  She has great range of motion without restriction or crepitation first metatarsal phalangeal joint.  Radiographs taken today demonstrate osseously mature individual with good bone stock and mineralization.  She has 1 retained screw to the dorsal arm of an Austin type bunion repair.  She does have mild movement of that capital osteotomy which is caused the head of the screw to backout just a small amount maybe a millimeter or less.  Assessment: Well-healing surgical foot.  Plan: Follow-up with me in 3 weeks for another set of x-rays.

## 2023-10-02 NOTE — Telephone Encounter (Signed)
Patient dropped off document  Capabilities and Limitations Form , to be filled out by provider. Patient requested to send it back via Call Patient to pick up within 7-days. Document is located in providers tray at front office.Please advise at Mobile [phone] (mobile)

## 2023-10-02 NOTE — Telephone Encounter (Signed)
Patient initially dropped the paperwork off during the last visit and PCP stated she did not complete her portion which must be done prior to faxing.  Patient was informed of this, signed her portion of the forms today and these  were faxed to [facility] team at [phone] and sent to be scanned.

## 2023-10-03 ENCOUNTER — Encounter: Payer: Self-pay | Admitting: Family Medicine

## 2023-10-07 ENCOUNTER — Ambulatory Visit
Admission: RE | Admit: 2023-10-07 | Discharge: 2023-10-07 | Disposition: A | Discharge status: Home / Self Care | Payer: 59 | Source: Ambulatory Visit | Attending: Family Medicine | Admitting: Family Medicine

## 2023-10-07 DIAGNOSIS — Z1231 Encounter for screening mammogram for malignant neoplasm of breast: Secondary | ICD-10-CM

## 2023-10-23 ENCOUNTER — Encounter: Payer: 59 | Admitting: Podiatry

## 2023-10-23 DIAGNOSIS — M2012 Hallux valgus (acquired), left foot: Secondary | ICD-10-CM

## 2023-11-11 ENCOUNTER — Ambulatory Visit: Payer: 59 | Admitting: Obstetrics and Gynecology

## 2023-11-11 ENCOUNTER — Encounter: Payer: Self-pay | Admitting: Podiatry

## 2023-12-05 ENCOUNTER — Other Ambulatory Visit: Payer: Self-pay | Admitting: Family Medicine

## 2023-12-05 DIAGNOSIS — E876 Hypokalemia: Secondary | ICD-10-CM

## 2023-12-10 ENCOUNTER — Ambulatory Visit: Payer: 59 | Admitting: Obstetrics and Gynecology

## 2024-03-04 ENCOUNTER — Other Ambulatory Visit: Payer: Self-pay | Admitting: Family Medicine

## 2024-03-04 DIAGNOSIS — I10 Essential (primary) hypertension: Secondary | ICD-10-CM

## 2024-03-23 ENCOUNTER — Ambulatory Visit: Payer: 59 | Admitting: Family Medicine

## 2024-03-23 ENCOUNTER — Encounter: Payer: Self-pay | Admitting: Family Medicine

## 2024-03-23 VITALS — BP 112/72 | HR 82 | Temp 98.1°F | Ht 64.0 in | Wt 162.7 lb

## 2024-03-23 DIAGNOSIS — E785 Hyperlipidemia, unspecified: Secondary | ICD-10-CM | POA: Diagnosis not present

## 2024-03-23 DIAGNOSIS — R600 Localized edema: Secondary | ICD-10-CM

## 2024-03-23 DIAGNOSIS — I10 Essential (primary) hypertension: Secondary | ICD-10-CM | POA: Diagnosis not present

## 2024-03-23 DIAGNOSIS — F39 Unspecified mood [affective] disorder: Secondary | ICD-10-CM | POA: Diagnosis not present

## 2024-03-23 DIAGNOSIS — E039 Hypothyroidism, unspecified: Secondary | ICD-10-CM | POA: Diagnosis not present

## 2024-03-23 DIAGNOSIS — R739 Hyperglycemia, unspecified: Secondary | ICD-10-CM

## 2024-03-23 DIAGNOSIS — M159 Polyosteoarthritis, unspecified: Secondary | ICD-10-CM | POA: Diagnosis not present

## 2024-03-23 LAB — COMPREHENSIVE METABOLIC PANEL WITH GFR
ALT: 16 U/L (ref 0–35)
AST: 27 U/L (ref 0–37)
Albumin: 4.4 g/dL (ref 3.5–5.2)
Alkaline Phosphatase: 119 U/L — ABNORMAL HIGH (ref 39–117)
BUN: 14 mg/dL (ref 6–23)
CO2: 30 meq/L (ref 19–32)
Calcium: 9.9 mg/dL (ref 8.4–10.5)
Chloride: 98 meq/L (ref 96–112)
Creatinine, Ser: 0.96 mg/dL (ref 0.40–1.20)
GFR: 64 mL/min (ref >60.00)
Glucose, Bld: 94 mg/dL (ref 70–99)
Potassium: 3.3 meq/L — ABNORMAL LOW (ref 3.5–5.1)
Sodium: 138 meq/L (ref 135–145)
Total Bilirubin: 0.5 mg/dL (ref 0.2–1.2)
Total Protein: 7.5 g/dL (ref 6.0–8.3)

## 2024-03-23 LAB — LIPID PANEL
Cholesterol: 184 mg/dL (ref 0–200)
HDL: 52.2 mg/dL (ref >39.00)
LDL Cholesterol: 116 mg/dL — ABNORMAL HIGH (ref 0–99)
NonHDL: 132.05
Total CHOL/HDL Ratio: 4
Triglycerides: 80 mg/dL (ref 0.0–149.0)
VLDL: 16 mg/dL (ref 0.0–40.0)

## 2024-03-23 LAB — TSH: TSH: 4.97 u[IU]/mL (ref 0.35–5.50)

## 2024-03-23 LAB — HEMOGLOBIN A1C: Hgb A1c MFr Bld: 5.5 % (ref 4.6–6.5)

## 2024-03-23 MED ORDER — FUROSEMIDE 20 MG PO TABS: 20.0000 mg | ORAL_TABLET | Freq: Every day | ORAL | 3 refills | Status: AC | PRN

## 2024-03-23 MED ORDER — DICLOFENAC SODIUM 75 MG PO TBEC: 75.0000 mg | DELAYED_RELEASE_TABLET | Freq: Two times a day (BID) | ORAL | 5 refills | Status: AC

## 2024-03-23 MED ORDER — ATORVASTATIN CALCIUM 20 MG PO TABS: 20.0000 mg | ORAL_TABLET | Freq: Every day | ORAL | 1 refills | Status: DC

## 2024-03-23 MED ORDER — HYDROCHLOROTHIAZIDE 25 MG PO TABS: 25.0000 mg | ORAL_TABLET | Freq: Every day | ORAL | 1 refills | Status: DC

## 2024-03-23 MED ORDER — LEVOTHYROXINE SODIUM 50 MCG PO TABS: 50.0000 ug | ORAL_TABLET | Freq: Every day | ORAL | 1 refills | Status: DC

## 2024-03-23 MED ORDER — DESVENLAFAXINE SUCCINATE ER 100 MG PO TB24: 100.0000 mg | ORAL_TABLET | Freq: Every day | ORAL | 1 refills | Status: DC

## 2024-03-23 NOTE — Progress Notes (Unsigned)
 Established Patient Office Visit  Subjective   Patient ID: Ariel [name], female    DOB: October 31, 1960  Age: 64 y.o. MRN: [id number]  Chief Complaint  Patient presents with   Medical Management of Chronic Issues    Pt is here for follow up of her chronic medical issues.   HTN -- BP in office performed and is well controlled. She  reports no side effects to the medications, no chest pain, SOB, dizziness or headaches. She has a BP cuff at home and is checking BP regularly, reports they are in the normal range.   Hypothyroid-- pt needs refills of her levothyroxine . She reports she is doing well on the current 50 mcg dose. Needs new TSH  HLD-- pt is tolerating her statin, no side effects reported.   Mood disorder-- pt reports that the pristiq  is helping some, however she still reports that she sometimes wants to "cry for no reason" she does report that she is having problems with attention and focus. States she joined a [employer] and tried to read a book but it didn't really hold her interest-- had to switch to audible. States she can focus "ok" at work.     Current Outpatient Medications  Medication Instructions   albuterol  (VENTOLIN  HFA) 108 (90 Base) MCG/ACT inhaler TAKE 2 PUFFS BY MOUTH EVERY 6 HOURS AS NEEDED FOR WHEEZE OR SHORTNESS OF BREATH   atorvastatin  (LIPITOR) 20 mg, Oral, Daily   B Complex Vitamins (B COMPLEX PO) Daily   CALCIUM  PO 1,200 mg, Daily   desvenlafaxine  (PRISTIQ ) 100 mg, Oral, Daily   diclofenac  (VOLTAREN ) 75 mg, Oral, 2 times daily   furosemide  (LASIX ) 20 mg, Oral, Daily PRN   hydrochlorothiazide  (HYDRODIURIL ) 25 mg, Oral, Daily   levothyroxine  (SYNTHROID ) 50 mcg, Oral, Daily   losartan  (COZAAR ) 25 mg, Oral, Daily   OVER THE COUNTER MEDICATION Tumeric with black pepper-once a day   Potassium Chloride  ER 20 MEQ TBCR TAKE 1 TABLET (20 MEQ) BY MOUTH DAILY.   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     Objective:     BP 112/72   Pulse 82   Temp 98.1 F (36.7 C) (Oral)   Ht 5\' 4"  (1.626 m)   Wt 162 lb 11.2 oz (73.8 kg)   LMP 06/29/2017   SpO2 97%   BMI 27.93 kg/m  {Vitals History (Optional):23777}  Physical Exam Vitals reviewed.  Constitutional:      Appearance: Normal appearance. She is well-groomed and normal weight.  Eyes:     Conjunctiva/sclera: Conjunctivae normal.  Neck:     Thyroid : No thyromegaly.  Cardiovascular:     Rate and Rhythm: Normal rate and  regular rhythm.     Pulses: Normal pulses.     Heart sounds: S1 normal and S2 normal.  Pulmonary:     Effort: Pulmonary effort is normal.     Breath sounds: Normal breath sounds and air entry.  Abdominal:     General: Bowel sounds are normal.  Musculoskeletal:     Right lower leg: No edema.     Left lower leg: No edema.  Neurological:     Mental Status: She is alert and oriented to person, place, and time. Mental status is at baseline.     Gait: Gait is intact.  Psychiatric:        Mood and Affect: Mood and affect normal.        Speech: Speech normal.        Behavior: Behavior normal.        Judgment: Judgment normal.      No  results found for any visits on 03/23/24.  {Labs (Optional):23779}  The 10-year ASCVD risk score (Arnett DK, et al., 2019) is: 5.2%    Assessment & Plan:  Hyperlipidemia, unspecified hyperlipidemia type -     Atorvastatin  Calcium ; Take 1 tablet (20 mg total) by mouth daily.  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  Dispense: 60 tablet; Refill: 5  Mood disorder (HCC) -     Desvenlafaxine  Succinate ER; Take 1 tablet (100 mg total) by mouth daily.  Dispense: 90 tablet; Refill: 1  Primary hypertension -     hydroCHLOROthiazide ; Take 1 tablet (25 mg total) by mouth daily.  Dispense: 90 tablet; Refill: 1 -     Comprehensive metabolic panel with GFR; Future  Hyperglycemia -     Hemoglobin A1c; Future  Hypothyroidism, unspecified type -     TSH; Future -     Levothyroxine  Sodium; Take 1 tablet (50 mcg total) by mouth daily.  Dispense: 90 tablet; Refill: 1  Peripheral edema -     Furosemide ; Take 1 tablet (20 mg total) by mouth daily as needed.  Dispense: 30 tablet; Refill: 3     Return in about 6 months (around 09/22/2024) for annual physical exam.    Aida House, MD

## 2024-03-23 NOTE — Patient Instructions (Signed)
 Add colace 100 mg tablet once daily

## 2024-03-24 ENCOUNTER — Ambulatory Visit: Payer: Self-pay | Admitting: Family Medicine

## 2024-03-24 DIAGNOSIS — R600 Localized edema: Secondary | ICD-10-CM | POA: Insufficient documentation

## 2024-03-24 DIAGNOSIS — E039 Hypothyroidism, unspecified: Secondary | ICD-10-CM | POA: Insufficient documentation

## 2024-03-24 DIAGNOSIS — E876 Hypokalemia: Secondary | ICD-10-CM

## 2024-03-24 NOTE — Assessment & Plan Note (Signed)
 Chronic, stable,pt is requesting refill of diclofenac  for her arthritis pain.

## 2024-03-24 NOTE — Assessment & Plan Note (Signed)
 BP well controlled Current hypertension medications:       Sig   furosemide  (LASIX ) 20 MG tablet (Taking As Needed) Take 1 tablet (20 mg total) by mouth daily as needed.   losartan  (COZAAR ) 25 MG tablet (Taking) TAKE 1 TABLET (25 MG TOTAL) BY MOUTH DAILY.   hydrochlorothiazide  (HYDRODIURIL ) 25 MG tablet Take 1 tablet (25 mg total) by mouth daily.

## 2024-03-24 NOTE — Assessment & Plan Note (Signed)
 Symptoms are stable on the pristiq . Will continue 100 mg daily. We discussed her additional symptoms and discussed possible need for treatment however I do not think what she is describing is abnormal. Will continue to monitor her symptoms and recheck in 6 months

## 2024-03-24 NOTE — Assessment & Plan Note (Signed)
 Needs new TSH today, continue 50 mcg dose for now.

## 2024-03-24 NOTE — Assessment & Plan Note (Signed)
Needs new lipid panel today, continue atorvastatin 20 mg daily

## 2024-04-20 ENCOUNTER — Encounter: Payer: Self-pay | Admitting: Family Medicine

## 2024-04-20 DIAGNOSIS — F39 Unspecified mood [affective] disorder: Secondary | ICD-10-CM

## 2024-04-20 MED ORDER — DESVENLAFAXINE SUCCINATE ER 100 MG PO TB24: 100.0000 mg | ORAL_TABLET | Freq: Every day | ORAL | 1 refills | Status: DC

## 2024-04-21 ENCOUNTER — Encounter: Payer: Self-pay | Admitting: Family Medicine

## 2024-04-23 ENCOUNTER — Encounter (INDEPENDENT_AMBULATORY_CARE_PROVIDER_SITE_OTHER): Payer: Self-pay

## 2024-05-04 ENCOUNTER — Ambulatory Visit: Admitting: Podiatry

## 2024-05-20 ENCOUNTER — Ambulatory Visit: Admitting: Podiatry

## 2024-06-29 ENCOUNTER — Encounter: Payer: Self-pay | Admitting: Family Medicine

## 2024-06-29 ENCOUNTER — Ambulatory Visit: Admitting: Family Medicine

## 2024-06-29 ENCOUNTER — Ambulatory Visit (INDEPENDENT_AMBULATORY_CARE_PROVIDER_SITE_OTHER)
Admission: RE | Admit: 2024-06-29 | Discharge: 2024-06-29 | Disposition: A | Discharge status: Home / Self Care | Source: Ambulatory Visit | Attending: Family Medicine | Admitting: Family Medicine

## 2024-06-29 VITALS — BP 140/90 | HR 85 | Temp 98.2°F | Ht 64.0 in | Wt 162.6 lb

## 2024-06-29 DIAGNOSIS — H00011 Hordeolum externum right upper eyelid: Secondary | ICD-10-CM

## 2024-06-29 DIAGNOSIS — M79642 Pain in left hand: Secondary | ICD-10-CM | POA: Diagnosis not present

## 2024-06-29 DIAGNOSIS — Z23 Encounter for immunization: Secondary | ICD-10-CM | POA: Diagnosis not present

## 2024-06-29 DIAGNOSIS — R413 Other amnesia: Secondary | ICD-10-CM

## 2024-06-29 DIAGNOSIS — K5909 Other constipation: Secondary | ICD-10-CM

## 2024-06-29 MED ORDER — METHYLPREDNISOLONE 4 MG PO TBPK: ORAL_TABLET | ORAL | 0 refills | Status: DC

## 2024-06-29 NOTE — Progress Notes (Signed)
 Acute Office Visit  Subjective:     Patient ID: Ariel [name], female    DOB: 1961-05-09, 63 y.o.   MRN: [id number]  Chief Complaint  Patient presents with   Hand Pain    Patient complains of left hand pain and lack of strength x1 month, tingling sensation noted in the left 3rd digit and radiates up shoulder   Cognitive decline for some time, requests testing    Hand Pain    Discussed the use of AI scribe software for clinical note transcription with the patient, who gave verbal consent to proceed.  History of Present Illness   Ariel [name] is a 63 year old female who presents with left hand pain and swelling.  She has persistent swelling and pain in her left hand, specifically affecting the middle and ring fingers. A painful knot has appeared on the end of the middle finger, and she also experiences a tingling sensation extending to her shoulder. There is a significant loss of strength in her hand, making daily activities difficult. She has been taking diclofenac  75 mg twice daily for inflammation, but it has not alleviated her symptoms. Her fingers remain swollen, preventing her from wearing rings. No x-rays of her hands have been done yet.  She experiences tingling sensations extending to her shoulder. She has not been able to wear her watch due to discomfort.  She reports a stye on her right eyelid that has persisted for almost a month. It drained partially on its own, but she continues to experience tenderness. She was prescribed an antibiotic by [facility], which she could not tolerate due to gastrointestinal upset. She has been using erythromycin eye ointment, but it has not been effective.  She is experiencing increased stress and is back in therapy due to feeling 'in a dark place.' She is on Pristiq  100 mg daily, which she feels was effective previously, but her current work situation is exacerbating her stress levels. 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 has started going to therapy also recently to help with her coping skills.  She reports gastrointestinal issues, including constipation and bloating, despite maintaining her weight. She uses enemas to relieve constipation and has tried fiber supplements and Miralax, but finds them either ineffective or causing extreme bowel movements. She has a history of hemorrhoids and has had a colonoscopy and sigmoidoscopy in the past, which revealed polyps and a tumor.  She is actively trying to manage her health by engaging in therapy, consulting a dietitian, and maintaining physical activity through walking.       Review of Systems  All other systems reviewed and are negative.       Objective:    BP (!) 140/90   Pulse 85   Temp 98.2 F (36.8 C) (Oral)   Ht 5' 4 (1.626 m)   Wt 162 lb 9.6 oz (73.8 kg)   LMP 06/29/2017   SpO2 100%   BMI 27.91 kg/m    Physical Exam Vitals reviewed.  Constitutional:      Appearance: Normal appearance. She is overweight.  Cardiovascular:     Pulses: Normal pulses.  Pulmonary:     Effort: Pulmonary effort is normal.     Breath sounds: Normal breath sounds. No wheezing.  Abdominal:     General: Bowel sounds are normal. There is no distension.     Palpations: Abdomen is soft.     Tenderness: There  is no abdominal tenderness.  Musculoskeletal:        General: Tenderness (over the left DIP joints of the 2nd and third digit.) and deformity (ganlion cyst on the left third PIP joint) present. No swelling.  Skin:    General: Skin is warm and dry.     Findings: No erythema.  Neurological:     General: No focal deficit present.     Mental Status: She is alert and oriented to person, place, and time.     No results found for any visits on 06/29/24.      Assessment & Plan:   Problem List Items Addressed This Visit   None Visit Diagnoses       Left hand pain    -   Primary   Relevant Medications   methylPREDNISolone  (MEDROL  DOSEPAK) 4 MG TBPK tablet   Other Relevant Orders   Uric acid   Rheumatoid factor   DG Hand Complete Left     Hordeolum externum of right upper eyelid         Memory impairment       Relevant Orders   Ambulatory referral to Neurology     Chronic constipation         Immunization due       Relevant Orders   Flu vaccine trivalent PF, 6mos and older(Flulaval,Afluria,Fluarix,Fluzone)     Assessment and Plan    Left hand pain, swelling, and ganglion cyst Pain and swelling in the left hand with a ganglion cyst on the middle finger. Differential includes gout, rheumatoid arthritis, and other inflammatory conditions. - Order uric acid level to rule out gout and rheumatoid factor to rule out RA. - Order x-ray of the left hand. - Prescribe Medrol  Dosepak. - Refer for EMG if symptoms persist.  Chronic constipation with external hemorrhoids Chronic constipation with external hemorrhoids, infrequent bowel movements, and abdominal bloating. Previous treatments had limited success. Stress may contribute. - Recommend daily fiber intake of 35 grams. - Advise use of probiotics daily. - Instruct to use Miralax daily. - Consider referral to GI specialist if symptoms persist.  Major depressive disorder, single episode Increased stress and depressive symptoms, possibly due to work environment. On maximum dose of Pristiq . Engaged in therapy and consulting a dietitian. - Continue current dose of Pristiq . - Encourage ongoing therapy sessions. - Support lifestyle modifications with dietitian.  Cognitive impairment, unspecified Reported cognitive difficulties including memory issues and difficulty finding words. Symptoms may be stress-related. - Refer to neurologist for comprehensive evaluation.  External hordeolum (right upper eyelid) External hordeolum on the right upper eyelid. Previous antibiotic treatment not tolerated. Lesion  partially drained. - Instruct to apply hot compresses to the right eyelid (20 minutes on, 20 minutes off). - Advise continuation of antibiotic ointment if desired. - Consider referral to ophthalmologist if no improvement.     Pt had multiple chronic and acute complaints, I spent 30 minutes with the patient in counseling, ordering appropriate tests and obtaining history.   Meds ordered this encounter  Medications   methylPREDNISolone  (MEDROL  DOSEPAK) 4 MG TBPK tablet    Sig: Take package as directed.    Dispense:  21 each    Refill:  0    No follow-ups on file.  Heron CHRISTELLA Sharper, MD

## 2024-06-29 NOTE — Patient Instructions (Signed)
 Fiber -- 35 grams per day  Add probiotics at least once daily  Miralax 1 scoop daily

## 2024-06-30 ENCOUNTER — Ambulatory Visit: Payer: Self-pay | Admitting: Family Medicine

## 2024-06-30 ENCOUNTER — Encounter: Payer: Self-pay | Admitting: Family Medicine

## 2024-07-06 ENCOUNTER — Other Ambulatory Visit (INDEPENDENT_AMBULATORY_CARE_PROVIDER_SITE_OTHER)

## 2024-07-06 DIAGNOSIS — M79642 Pain in left hand: Secondary | ICD-10-CM

## 2024-07-06 LAB — URIC ACID: Uric Acid, Serum: 4.1 mg/dL (ref 2.4–7.0)

## 2024-07-07 ENCOUNTER — Other Ambulatory Visit: Payer: Self-pay | Admitting: Medical Genetics

## 2024-07-07 LAB — RHEUMATOID FACTOR: Rheumatoid fact SerPl-aCnc: <10 [IU]/mL (ref <14)

## 2024-07-19 ENCOUNTER — Other Ambulatory Visit

## 2024-07-19 DIAGNOSIS — Z006 Encounter for examination for normal comparison and control in clinical research program: Secondary | ICD-10-CM

## 2024-07-20 ENCOUNTER — Ambulatory Visit (INDEPENDENT_AMBULATORY_CARE_PROVIDER_SITE_OTHER)

## 2024-07-20 ENCOUNTER — Ambulatory Visit: Admitting: Podiatry

## 2024-07-20 ENCOUNTER — Encounter: Payer: Self-pay | Admitting: Podiatry

## 2024-07-20 DIAGNOSIS — M7752 Other enthesopathy of left foot: Secondary | ICD-10-CM

## 2024-07-20 DIAGNOSIS — M2012 Hallux valgus (acquired), left foot: Secondary | ICD-10-CM

## 2024-07-20 NOTE — Progress Notes (Signed)
 She presents today date of surgery August 22, 2023 [facility] bunion repair with screw fixation left foot.  States that she still has some intermittent pain and swelling occasionally and she is concerned about the darkness of the second toenail and it seems to be getting a little bit thicker.  Objective: Vital signs stable alert oriented x 3.  Pulses are palpable.  There is no erythema edema salines drainage or odor she has great range of motion first metatarsophalangeal joint mild hammertoe deformities with early osteoarthritic change second digit of the left foot does demonstrate a subungual hematoma.  Assessment: Early capsulitis and subungual hematoma second metatarsophalangeal joint left and second toenail plate left.  Plan: Discussed etiology pathology conservative surgical therapies discussed the possible need for injection therapy.  Follow-up with her on an as-needed basis

## 2024-07-23 ENCOUNTER — Encounter: Payer: Self-pay | Admitting: Family Medicine

## 2024-07-27 ENCOUNTER — Encounter (INDEPENDENT_AMBULATORY_CARE_PROVIDER_SITE_OTHER): Payer: Self-pay

## 2024-07-27 LAB — GENECONNECT MOLECULAR SCREEN: Genetic Analysis Overall Interpretation: NEGATIVE

## 2024-08-02 ENCOUNTER — Ambulatory Visit

## 2024-08-02 NOTE — Progress Notes (Signed)
  Patient was present and evaluated for Custom molded foot orthotics. Patient will benefit from CFO's to provide total contact to BIL MLA's helping to balance and distribute body weight more evenly across BIL feet helping to reduce plantar pressure and pain. Orthotic will also encourage FF / RF alignment  Patient was scanned today and will return for fitting upon receipt  Deductible still owing $466.00 and 20% coins patient is aware   Lolita Schultze Cped, CFo, CFm

## 2024-08-05 ENCOUNTER — Encounter: Payer: Self-pay | Admitting: Neurology

## 2024-08-18 ENCOUNTER — Other Ambulatory Visit: Payer: Self-pay | Admitting: Family Medicine

## 2024-08-18 DIAGNOSIS — I10 Essential (primary) hypertension: Secondary | ICD-10-CM

## 2024-08-22 ENCOUNTER — Encounter: Payer: Self-pay | Admitting: Family Medicine

## 2024-08-24 ENCOUNTER — Other Ambulatory Visit: Payer: Self-pay | Admitting: Family Medicine

## 2024-08-24 ENCOUNTER — Encounter: Payer: Self-pay | Admitting: Family Medicine

## 2024-08-24 DIAGNOSIS — Z1231 Encounter for screening mammogram for malignant neoplasm of breast: Secondary | ICD-10-CM

## 2024-08-30 ENCOUNTER — Ambulatory Visit (INDEPENDENT_AMBULATORY_CARE_PROVIDER_SITE_OTHER)

## 2024-08-30 DIAGNOSIS — M2142 Flat foot [pes planus] (acquired), left foot: Secondary | ICD-10-CM | POA: Diagnosis not present

## 2024-08-30 DIAGNOSIS — Z9889 Other specified postprocedural states: Secondary | ICD-10-CM | POA: Diagnosis not present

## 2024-08-30 DIAGNOSIS — M7752 Other enthesopathy of left foot: Secondary | ICD-10-CM | POA: Diagnosis not present

## 2024-08-30 DIAGNOSIS — M2141 Flat foot [pes planus] (acquired), right foot: Secondary | ICD-10-CM

## 2024-08-30 DIAGNOSIS — M2012 Hallux valgus (acquired), left foot: Secondary | ICD-10-CM

## 2024-09-01 NOTE — Progress Notes (Signed)
 NEUROLOGY FOLLOW UP OFFICE NOTE  Ariel [name] [id number]  Subjective:  Ariel [name] is a 63 y.o. year old right-handed female with a medical history of HLD, HTN, hypothyroidism, anxiety, depression, cardiac arrhythmia s/p ablation who we last saw on 12/13/22 for right upper limb tingling and weakness.  To briefly review: 12/14/23: Patient had a breast reduction surgery (~2021). There were several complications including necrosis and loss of the nipple. She has had to have some revisions. Since the surgeries, she has had weakness of the right arm and some tingling of the right arm. She has a lot of pain in the right shoulder as well. The weakness seems to be more distally with her grip, but no difficulty lifting her arm. Her tingling is localized over the deltoid (by patient pointing at the symptoms). She feels like her hand is going to sleep and has to shake it out. She denies significant symptoms at night.    She does endorse carrying a big bag over the right shoulder. She types all day. She also endorses neck pain.   She has no symptoms in the left arm. She occasionally gets some tingling in her feet when sitting too long.   Patient was started on B complex in December of 2023. She is desvenlafaxine  for depression.   She does not report any constitutional symptoms like fever, night sweats, anorexia or unintentional weight loss.   EtOH use: Very rare  Restrictive diet? No Family history of neuropathy/myopathy/neurologic disease? No  Most recent Assessment and Plan (12/13/22): Ariel [name] is a 62 y.o. female who presents for evaluation of numbness, tingling, and weakness of the right upper extremity. She has a relevant medical history of HLD, HTN, hypothyroidism, depression, cardiac arrhythmia s/p ablation. Her neurological examination is essentially normal today. The etiology of patient's symptoms is currently unclear. Her exam did not provide any helpful clues today. Her  sensory deficit (by patient report) appear in the axillary distribution, but her subjective weakness is more distal. A cervical radiculopathy (?C7) is also possible given her neck pain. Carpal tunnel or ulnar neuropathy seem less likely. I will get an EMG to further characterize.   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 I recommended wrist splinting for right CTS. This has greatly improved.   She is now having problems in her left hand. She was told she had a cyst in her left 3rd digit. This hand hurts a lot. She puts it in warm water to loosen it up. It feels very stiff. This started a couple of months ago.  Patient recently noticed her short term memory is not great. Her long term memory is fine. At work, she has to write everything down to remember. She cannot remember what she is being told. She will be talking and forget what she was talking about. She is still independent of ADLs. She is driving and does not get lost in familiar situations.   Regarding sleep, she sleeps at least 6-7 hours each night. She does not feel tired when she wakes but can be tired throughout the day. She is not sure if she snores.  She endorses increased stress, mostly work related. She just started buspirone for anxiety. She is also on desvenlafaxine  for depressed. She is seeing psychiatry and psychology. She is also going for ADHD testing.  MEDICATIONS:  Outpatient Encounter  Medications as of 09/09/2024  Medication Sig   albuterol  (VENTOLIN  HFA) 108 (90 Base) MCG/ACT inhaler TAKE 2 PUFFS BY MOUTH EVERY 6 HOURS AS NEEDED FOR WHEEZE OR SHORTNESS OF BREATH   atorvastatin  (LIPITOR) 20 MG tablet Take 1 tablet (20 mg total) by mouth daily.   B Complex Vitamins (B COMPLEX PO) Take by mouth daily. (Patient taking differently: Take by mouth daily. Occasionally)   BERBERINE CHLORIDE PO Take by  mouth daily.   CALCIUM  PO Take 1,200 mg by mouth daily.   desvenlafaxine  (PRISTIQ ) 100 MG 24 hr tablet Take 1 tablet (100 mg total) by mouth daily.   diclofenac  (VOLTAREN ) 75 MG EC tablet Take 1 tablet (75 mg total) by mouth 2 (two) times daily. (Patient taking differently: Take 75 mg by mouth 2 (two) times daily. 1/2 tab 2 times a day)   hydrochlorothiazide  (HYDRODIURIL ) 25 MG tablet Take 1 tablet (25 mg total) by mouth daily.   levothyroxine  (SYNTHROID ) 50 MCG tablet TAKE 1 TABLET BY MOUTH EVERY DAY   losartan  (COZAAR ) 25 MG tablet TAKE 1 TABLET (25 MG TOTAL) BY MOUTH DAILY.   Prenatal Vit-Fe Fumarate-FA (PRENATAL MULTIVITAMIN) TABS tablet Take 1 tablet by mouth daily at 12 noon. (Patient taking differently: Take 1 tablet by mouth daily at 12 noon. Occasionally)   Spacer/Aero-Holding Raguel DEVI 1 Device by Does not apply route daily as needed.   traZODone  (DESYREL ) 50 MG tablet Take 0.5-1 tablets (25-50 mg total) by mouth at bedtime as needed.   furosemide  (LASIX ) 20 MG tablet Take 1 tablet (20 mg total) by mouth daily as needed. (Patient not taking: Reported on 09/09/2024)   [DISCONTINUED] levothyroxine  (SYNTHROID ) 50 MCG tablet Take 1 tablet (50 mcg total) by mouth daily.   No facility-administered encounter medications on file as of 09/09/2024.    PAST MEDICAL HISTORY: Past Medical History:  Diagnosis Date   Anxiety    Chronic bronchitis (HCC)    Depression    Dyspnea    occasional with exertion   Fibroids, intramural    Goiter    History of blood transfusion 1964   Hyperlipidemia    Hypertension    Hypothyroidism    Open wound of right breast 10/03/2020   Osteopenia 01/2018   T score -1.3 FRAX 3.8% / 0.5%   Pneumonia    Thyroid  disease    Tobacco use 05/29/2017    PAST SURGICAL HISTORY: Past Surgical History:  Procedure Laterality Date   BREAST REDUCTION SURGERY Bilateral 08/03/2020   Procedure: BREAST REDUCTION WITH LIPOSUCTION;  Surgeon: Lowery Estefana RAMAN, DO;   Location: [facility];  Service: Plastics;  Laterality: Bilateral;  3.5 hours   BUNIONECTOMY Right    CARDIAC ELECTROPHYSIOLOGY STUDY AND ABLATION  2010   hx tachycardia    EUS N/A 08/26/2022   Procedure: LOWER ENDOSCOPIC ULTRASOUND (EUS);  Surgeon: Wilhelmenia Aloha Raddle., MD;  Location: [facility];  Service: Gastroenterology;  Laterality: N/A;  forward viewing, linear radial, mini probe   EUS N/A 09/11/2023   Procedure: LOWER ENDOSCOPIC ULTRASOUND (EUS);  Surgeon: Wilhelmenia Aloha Raddle., MD;  Location: [facility];  Service: Gastroenterology;  Laterality: N/A;   FLEXIBLE SIGMOIDOSCOPY N/A 08/26/2022   Procedure: FLEXIBLE SIGMOIDOSCOPY;  Surgeon: Wilhelmenia Aloha Raddle., MD;  Location: [facility];  Service: Gastroenterology;  Laterality: N/A;   FLEXIBLE SIGMOIDOSCOPY N/A 09/11/2023   Procedure: FLEXIBLE SIGMOIDOSCOPY;  Surgeon: Wilhelmenia Aloha Raddle., MD;  Location: [facility];  Service: Gastroenterology;  Laterality: N/A;   LAPAROSCOPIC TOTAL HYSTERECTOMY  01/20/2017  Dr. Eloy at [facility] -- cervix absent on pap in 2022   LAPAROTOMY N/A 06/26/2017   Procedure: EXPLORATORY LAPAROTOMY;  Surgeon: Eloy Herring, MD;  Location: [facility];  Service: Gynecology;  Laterality: N/A;   LESION EXCISION WITH COMPLEX REPAIR Right 04/03/2022   Procedure: Scar contracture release right breast with fat grafting;  Surgeon: Lowery Estefana RAMAN, DO;  Location: [facility];  Service: Plastics;  Laterality: Right;   LIPOSUCTION WITH LIPOFILLING Bilateral 04/03/2022   Procedure: LIPOSUCTION WITH LIPOFILLING;  Surgeon: Lowery Estefana RAMAN, DO;  Location: [facility];  Service: Plastics;  Laterality: Bilateral;   POLYPECTOMY  09/11/2023   Procedure: POLYPECTOMY;  Surgeon: Wilhelmenia Aloha Raddle., MD;  Location: [facility];  Service: Gastroenterology;;   REDUCTION MAMMAPLASTY Bilateral    Had to have corrective surgery after reduction due to developing necrosis   THYROID  LOBECTOMY Left 06/18/2019    Procedure: LEFT THYROID  LOBECTOMY;  Surgeon: Eletha Boas, MD;  Location: [facility];  Service: General;  Laterality: Left;   TONSILLECTOMY  1967   TUBAL LIGATION     X2,1 REVERSAL    ALLERGIES: Allergies  Allergen Reactions   Cephalexin  Nausea Only and Other (See Comments)    headache   Other Nausea Only    Antibiotic that was for foot surgery    Qsymia [Phentermine -Topiramate Er] Nausea And Vomiting   Wellbutrin  [Bupropion ] Other (See Comments)    Made her sick    FAMILY HISTORY: Family History  Problem Relation Age of Onset   Hypertension Mother    Renal Disease Mother    Diabetes Father    Hypertension Father    Renal Disease Father        on dialysis   Thyroid  disease Maternal Aunt    Stroke Maternal Grandmother    Breast cancer Maternal Grandmother        diagnosed in her 36's   Cancer Paternal Grandmother        ?   Healthy Daughter    Healthy Son    Colon polyps Neg Hx    Colon cancer Neg Hx    Esophageal cancer Neg Hx    Rectal cancer Neg Hx    Stomach cancer Neg Hx     SOCIAL HISTORY: Social History   Tobacco Use   Smoking status: Former    Current packs/day: 0.00    Average packs/day: 0.3 packs/day for 30.0 years (7.5 ttl pk-yrs)    Types: Cigarettes    Start date: 06/16/1989    Quit date: 06/17/2019    Years since quitting: 5.2   Smokeless tobacco: Never  Vaping Use   Vaping status: Never Used  Substance Use Topics   Alcohol use: Not Currently    Comment: 1-2 x a month   Drug use: Yes    Types: Marijuana    Comment: Occas - last use 03/21/22  occasional edible   Social History   Social History Narrative   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 Christian, no church currenty      Lifestyle: poor diet; no exercise   Are you right handed or left handed? right   Are you currently employed ? yes   What is your current occupation?    Do you live at home alone? yes   Who lives  with you?    What type of home do you live in: 1 story or 2 story? two  Caffeine a lot a day      Objective:  Vital Signs:  BP (!) 144/86   Pulse 83   Ht 5' 3 (1.6 m)   Wt 165 lb (74.8 kg)   LMP 06/29/2017   SpO2 98%   BMI 29.23 kg/m   General: No acute distress.  Patient appears well-groomed.   Head:  Normocephalic/atraumatic Neck: supple Heart: regular rate and rhythm Lungs: Clear to auscultation bilaterally. Vascular: No carotid bruits.  Neurological Exam: Mental status: alert and oriented, speech fluent and not dysarthric, language intact.  MoCA: Visuospatial/executive: 4/5 Naming: 2/3 Attention: 2/2, 1/1, 1/3 Language: 0/2, 0/1 (6 F words) Abstraction: 0/2 Delayed recall: 2/5 Orientation: 6/6 Total 18/30  Cranial nerves: CN I: not tested CN II: pupils equal, round and reactive to light, visual fields intact CN III, IV, VI:  full range of motion, no nystagmus, no ptosis CN V: facial sensation intact. CN VII: upper and lower face symmetric CN VIII: hearing intact CN IX, X: uvula midline CN XI: sternocleidomastoid and trapezius muscles intact CN XII: tongue midline  Bulk & Tone: normal, no fasciculations. Motor:  muscle strength 5/5 throughout Deep Tendon Reflexes:  2+ throughout.   Sensation:  Pinprick sensation intact. Finger to nose testing:  Without dysmetria.    Gait:  Normal station and stride.   Labs and Imaging review: New results: 03/23/24: CMP significant for K 3.3, alk phos 119 TSH wnl HbA1c: 5.5 Lipid panel: tChol 184, LDL 116, TG 80.0  Ferritin 09/22/23: 27  EMG (12/13/22): NCV & EMG Findings: Extensive electrodiagnostic evaluation of the right upper limb shows: Right median, ulnar, and radial sensory responses are within normal limits. Right median-ulnar palmar sensory nerve responses showed an abnormal peak latency difference (0.62 ms). Right median (APB) and ulnar (ADM) sensory responses are within normal limits. Chronic motor  axon loss changes without evidence of active denervation changes are seen in the right first dorsal interosseous and right extensor indicis proprius muscles. All other tested muscles are within normal limits with normal motor unit configuration and recruitment patterns.   Impression: This is an abnormal study. 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 Evidence of a right median mononeuropathy at or distal to the wrist, consistent with carpal tunnel syndrome, as evidenced by abnormal median-ulnar palmar peak latency difference, very mild in degree electrically. Screening studies for right ulnar or radial mononeuropathies are normal.  Left hand xray (06/29/24): FINDINGS: No acute fracture or dislocation. Mild joint space loss and osteophyte formation throughout the DIP joints. Soft tissues are unremarkable. No radiopaque foreign body.   IMPRESSION: 1. No acute fracture or dislocation. 2. 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  Previously reviewed results: CBC (11/29/22) unremarkable Iron studies (11/29/22): significant for ferritin of 9 TSH (09/18/22): 1.25  Assessment/Plan:  This is Ariel [name], a 63 y.o. female with: Right arm numbness/tingling - EMG showed old right C8 radiculopathy and mild CTS. Symptoms have improved with brace Left hand pain - new, possible left CTS but there is stiffness and a cyst on the 3rd digit that could be causing symptoms. Patient agrees to EMG of LUE to further evaluate Cognitive change - mostly short term memory issues. Though she did poorly on MoCA today, some of that seems to be anxiety about the test and not understanding some of the instructions. I do not think this score represents  her cognitive abilities. More likely her increased stress and anxiety are contributing. Her sleep seems okay, but this is  another common cause of memory issues in young patients.   Plan: -Blood work: B1, B12 -EMG: LUE - 09/14/24 at 9 am -Discussed importance of sleep and mood in cognition. She will work with behavioral health and we will monitor cognition closely.   Return to clinic in 6 months  Total time spent reviewing records, interview, history/exam, documentation, and coordination of care on day of encounter: 45 min  Venetia Potters, MD

## 2024-09-02 ENCOUNTER — Other Ambulatory Visit: Payer: Self-pay | Admitting: Family Medicine

## 2024-09-02 DIAGNOSIS — E039 Hypothyroidism, unspecified: Secondary | ICD-10-CM

## 2024-09-02 NOTE — Progress Notes (Signed)
 Patient presents today to pick up custom molded foot orthotics, diagnosed with Pes planus and left foot deformity post status by Dr. Verta.   Orthotics were dispensed and fit was satisfactory. Reviewed instructions for break-in and wear. Written instructions given to patient.  Patient will follow up as needed. Lolita Schultze CPed

## 2024-09-09 ENCOUNTER — Encounter: Payer: Self-pay | Admitting: Neurology

## 2024-09-09 ENCOUNTER — Ambulatory Visit

## 2024-09-09 ENCOUNTER — Ambulatory Visit: Admitting: Neurology

## 2024-09-09 ENCOUNTER — Other Ambulatory Visit

## 2024-09-09 VITALS — BP 144/86 | HR 83 | Ht 63.0 in | Wt 165.0 lb

## 2024-09-09 DIAGNOSIS — R4189 Other symptoms and signs involving cognitive functions and awareness: Secondary | ICD-10-CM | POA: Diagnosis not present

## 2024-09-09 DIAGNOSIS — M79642 Pain in left hand: Secondary | ICD-10-CM

## 2024-09-09 DIAGNOSIS — M5412 Radiculopathy, cervical region: Secondary | ICD-10-CM

## 2024-09-09 DIAGNOSIS — G5601 Carpal tunnel syndrome, right upper limb: Secondary | ICD-10-CM | POA: Diagnosis not present

## 2024-09-09 NOTE — Patient Instructions (Addendum)
 I saw you today for the pain in your left hand and cognitive changes.  I will get lab work and nerve testing of the left arm. Stop at the front, but I can do your nerve testing on 09/14/24 at 9 am.  In terms of cognitive changes, you did not do well on our cognitive test today, but I do not think you have a problem like cognitive impairment or dementia. This is most likely related to increased stress and anxiety/depression as this affects how you are able to store information. I want you to work with your psychiatrist and psychologist and I will monitor the cognition. Sleep is also very important for cognition.  I will check labs today to be sure there is no other cause of the cognitive change that I can treat.  We will talk after I have these results.  Please let me know if you have any questions or concerns in the meantime.  The physicians and staff at [facility] are committed to providing excellent care. You may receive a survey requesting feedback about your experience at our office. We strive to receive very good responses to the survey questions. If you feel that your experience would prevent you from giving the office a very good  response, please contact our office to try to remedy the situation. We may be reached at [phone]. Thank you for taking the time out of your busy day to complete the survey.  Venetia Potters, MD [facility]  ELECTROMYOGRAM AND NERVE CONDUCTION STUDIES (EMG/NCS) INSTRUCTIONS  How to Prepare The neurologist conducting the EMG will need to know if you have certain medical conditions. Tell the neurologist and other EMG lab personnel if you: Have a pacemaker or any other electrical medical device Take blood-thinning medications Have hemophilia, a blood-clotting disorder that causes prolonged bleeding Bathing Take a shower or bath shortly before your exam in order to remove oils from your skin. Don't apply lotions or creams before the exam.  What  to Expect You'll likely be asked to change into a hospital gown for the procedure and lie down on an examination table. The following explanations can help you understand what will happen during the exam.  Electrodes. The neurologist or a technician places surface electrodes at various locations on your skin depending on where you're experiencing symptoms. Or the neurologist may insert needle electrodes at different sites depending on your symptoms.  Sensations. The electrodes will at times transmit a tiny electrical current that you may feel as a twinge or spasm. The needle electrode may cause discomfort or pain that usually ends shortly after the needle is removed. If you are concerned about discomfort or pain, you may want to talk to the neurologist about taking a short break during the exam.  Instructions. During the needle EMG, the neurologist will assess whether there is any spontaneous electrical activity when the muscle is at rest - activity that isn't present in healthy muscle tissue - and the degree of activity when you slightly contract the muscle.  He or she will give you instructions on resting and contracting a muscle at appropriate times. Depending on what muscles and nerves the neurologist is examining, he or she may ask you to change positions during the exam.  After your EMG You may experience some temporary, minor bruising where the needle electrode was inserted into your muscle. This bruising should fade within several days. If it persists, contact your primary care doctor.

## 2024-09-13 LAB — VITAMIN B1: Vitamin B1 (Thiamine): 17 nmol/L (ref 8–30)

## 2024-09-13 LAB — VITAMIN B12: Vitamin B-12: 579 pg/mL (ref 200–1100)

## 2024-09-14 ENCOUNTER — Telehealth: Payer: Self-pay | Admitting: Neurology

## 2024-09-14 ENCOUNTER — Ambulatory Visit: Admitting: Neurology

## 2024-09-14 DIAGNOSIS — G5603 Carpal tunnel syndrome, bilateral upper limbs: Secondary | ICD-10-CM

## 2024-09-14 DIAGNOSIS — M79642 Pain in left hand: Secondary | ICD-10-CM

## 2024-09-14 NOTE — Procedures (Signed)
 [facility]  [address] [place], [address]  [place], [place] [address] Tel: [phone] Fax: [phone] Test Date:  09/14/2024  Patient: Ariel [name] DOB: [birth year] Physician: Venetia Potters, MD  Sex: Female Height: 5' 3 Ref Phys: Venetia Potters, MD  ID#: [id number]   Technician:    History: This is a 63 year old female with left hand pain.  NCV & EMG Findings: Extensive electrodiagnostic evaluation of the left upper limb shows: Left Median-Ulnar Palmar sensory response shows prolonged distal peak latency (Median Palm-Wrist, 2.4 ms) and abnormal peak latency difference ((Median Palm-Wrist)-(Ulnar Palm-Wrist), 0.55 ms). Left median, ulnar, and radial sensory responses are within normal limits. Left median (APB) and ulnar (ADM) motor responses are within normal limits. There is no evidence of active or chronic motor axon loss changes affecting any of the tested muscles on needle examination. Motor unit configuration and recruitment pattern is within normal limits.  Impression: This is an abnormal study. The findings are most consistent with the following: Evidence of a left median mononeuropathy at or distal to the wrist, consistent with carpal tunnel syndrome, very mild in degree electrically. No electrodiagnostic evidence of a left cervical (C5-C8) motor radiculopathy. Screening studies for left ulnar or radial mononeuropathies are normal.    ___________________________ Venetia Potters, MD    Nerve Conduction Studies Motor Nerve Results    Latency Amplitude F-Lat Segment Distance CV Comment  Site (ms) Norm (mV) Norm (ms)  (cm) (m/s) Norm   Left Median (APB) Motor  Wrist 2.6  < 4.0 11.9  > 5.0        Elbow 7.2 - 11.6 -  Elbow-Wrist 27 59  > 50   Left Ulnar (ADM) Motor  Wrist 1.68  < 3.1 8.6  > 7.0        Bel elbow 5.0 - 8.5 -  Bel elbow-Wrist 20 61  > 50   Ab elbow 6.8 - 7.9 -  Ab elbow-Bel elbow 10 56 -    Sensory Sites    Neg Peak Lat Amplitude (O-P) Segment  Distance Velocity Comment  Site (ms) Norm (V) Norm  (cm) (ms)   Left Median Sensory  Wrist-Dig II 3.5  < 3.8 25  > 10 Wrist-Dig II 13    Left Median-Ulnar Palmar Sensory       Median  Palm-Wrist *2.4  < 2.2 43  > 10 Palm-Wrist 8         Ulnar  Palm-Wrist 1.85  < 2.2 21  > 5 Palm-Wrist 8    Left Radial Sensory  Forearm-Wrist 1.80  < 2.8 34  > 10 Forearm-Wrist 10    Left Ulnar Sensory  Wrist-Dig V 2.5  < 3.2 25  > 5 Wrist-Dig V 11     Inter-Nerve Comparisons   Nerve 1 Value 1 Nerve 2 Value 2 Parameter Result Normal  Sensory Sites  L Median Palm-Wrist 2.4 ms L Ulnar Palm-Wrist 1.85 ms Peak Lat Diff *0.55 ms <0.40   Electromyography   Side Muscle Ins.Act Fibs Fasc Recrt Amp Dur Poly Activation Comment  Left FDI Nml Nml Nml Nml Nml Nml Nml Nml N/A  Left EIP Nml Nml Nml Nml Nml Nml Nml Nml N/A  Left Pronator teres Nml Nml Nml Nml Nml Nml Nml Nml N/A  Left Biceps Nml Nml Nml Nml Nml Nml Nml Nml N/A  Left Triceps Nml Nml Nml Nml Nml Nml Nml Nml N/A  Left Deltoid Nml Nml Nml Nml Nml Nml Nml Nml N/A  Waveforms:  Motor      Sensory

## 2024-09-14 NOTE — Telephone Encounter (Signed)
 Discussed the results of patient's EMG after the procedure today. It showed very mild left carpal tunnel syndrome, similar to findings on the right from 12/16/22. Patient improved on the right with bracing, so I recommended she also get a brace for the left.  All questions were answered.  Ariel Potters, MD [facility]

## 2024-09-21 ENCOUNTER — Other Ambulatory Visit: Payer: Self-pay | Admitting: Family Medicine

## 2024-09-21 DIAGNOSIS — E785 Hyperlipidemia, unspecified: Secondary | ICD-10-CM

## 2024-09-28 ENCOUNTER — Ambulatory Visit: Admitting: Family Medicine

## 2024-09-28 VITALS — BP 140/70 | HR 95 | Temp 98.4°F | Ht 63.0 in | Wt 169.6 lb

## 2024-09-28 DIAGNOSIS — I10 Essential (primary) hypertension: Secondary | ICD-10-CM

## 2024-09-28 MED ORDER — LOSARTAN POTASSIUM 50 MG PO TABS: 50.0000 mg | ORAL_TABLET | Freq: Every day | ORAL | 1 refills | Status: AC

## 2024-09-28 NOTE — Progress Notes (Signed)
 Established Patient Office Visit  Subjective   Patient ID: Ariel [name], female    DOB: 1961/03/14  Age: 63 y.o. MRN: [id number]  Chief Complaint  Patient presents with   Medical Management of Chronic Issues    HPI Discussed the use of AI scribe software for clinical note transcription with the patient, who gave verbal consent to proceed.  History of Present Illness   Ariel [name] is a 63 year old female with hypertension who presents for blood pressure management and evaluation of weight gain.  She has elevated blood pressure treated with losartan  25 mg and hydrochlorothiazide  25 mg. She has not been checking blood pressure at home and feels her stress level has increased.  She is concerned about abdominal weight gain despite what she feels is a healthy diet and regular exercise. She wonders if stress or her medications are contributing and asks about checking cortisol levels. Thyroid  tests in June were normal and her A1c was 5.5.  She is undergoing evaluation for ADHD but no treatment changes yet.  She takes Pristiq  for depression and buspirone for anxiety and feels some benefit after 2 weeks. She reports intermittent muscle spasms that make her stop briefly. She notes more regular bowel movements and denies constipation.  She wears stabilizers on both hands at night, which reduce hand pain, especially on the right. She has a painful ganglion cyst on her hand at times.       Current Outpatient Medications  Medication Instructions   albuterol  (VENTOLIN  HFA) 108 (90 Base) MCG/ACT inhaler TAKE 2 PUFFS BY MOUTH EVERY 6 HOURS AS NEEDED FOR WHEEZE OR SHORTNESS OF BREATH   atorvastatin  (LIPITOR) 20 mg, Oral, Daily   BERBERINE CHLORIDE PO Daily   busPIRone (BUSPAR) 10 mg, Daily   CALCIUM  PO 1,200 mg, Daily   desvenlafaxine  (PRISTIQ ) 100 mg, Oral, Daily   diclofenac  (VOLTAREN ) 75 mg, Oral, 2 times daily   furosemide  (LASIX ) 20 mg, Oral, Daily PRN   hydrochlorothiazide   (HYDRODIURIL ) 25 mg, Oral, Daily   levothyroxine  (SYNTHROID ) 50 mcg, Oral, Daily   losartan  (COZAAR ) 50 mg, Oral, Daily   Prenatal Vit-Fe Fumarate-FA (PRENATAL MULTIVITAMIN) TABS tablet 1 tablet, Daily   Spacer/Aero-Holding Chambers DEVI 1 Device, Does not apply, Daily PRN   traZODone  (DESYREL ) 25-50 mg, Oral, At bedtime PRN    Patient Active Problem List   Diagnosis Date Noted   Hypothyroidism 03/24/2024   Peripheral edema 03/24/2024   Rectal polyp 09/11/2023   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     Objective:     BP (!) 140/70   Pulse 95   Temp 98.4 F (36.9 C) (Oral)   Ht 5' 3 (1.6 m)   Wt 169 lb 9.6 oz (76.9 kg)   LMP 06/29/2017   SpO2 98%   BMI 30.04 kg/m    Physical Exam Vitals reviewed.  Constitutional:      Appearance: Normal appearance. She is well-groomed and overweight.  Cardiovascular:  Rate and Rhythm: Normal rate and regular rhythm.     Heart sounds: S1 normal and S2 normal.  Pulmonary:     Effort: Pulmonary effort is normal.     Breath sounds: Normal breath sounds and air entry.  Neurological:     Mental Status: She is alert and oriented to person, place, and time. Mental status is at baseline.     Gait: Gait is intact.  Psychiatric:        Mood and Affect: Mood and affect normal.        Speech: Speech normal.        Behavior: Behavior normal.        Judgment: Judgment normal.      No results  found for any visits on 09/28/24.    The 10-year ASCVD risk score (Arnett DK, et al., 2019) is: 10.1%    Assessment & Plan:  Primary hypertension -     Salivary Cortisol; Future -     Salivary Cortisol; Future -     Losartan  Potassium; Take 1 tablet (50 mg total) by mouth daily.  Dispense: 90 tablet; Refill: 1  Major depressive disorder in partial remission, unspecified whether recurrent   Assessment and Plan    Primary hypertension Blood pressure remains elevated at 140s despite current regimen of losartan  25 mg and hydrochlorothiazide  25 mg. Possible contributing factors include stress and potential cortisol imbalance. Discussed potential need for medication adjustment due to age-related changes and stress. - Increased losartan  to 50 mg daily by taking two 25 mg tablets until current supply is exhausted, then switch to 50 mg tablets. - Ordered salivary cortisol testing to evaluate for potential cortisol imbalance. - Monitor blood pressure at home and report persistent high readings.  Ganglion cyst of hand Presence of a benign ganglion cyst on the hand, causing occasional pain. No immediate intervention required unless symptoms worsen. Discussed potential referral to a hand surgeon for surgical removal or injection if desired. - Will consider referral to hand surgeon if symptoms worsen or if she desires intervention.  Depression and anxiety Currently managed with Pristiq  for depression and buspirone for anxiety. Reports improvement in anxiety symptoms with buspirone. Discussed potential side effect of weight gain from Pristiq , possibly contributing to abdominal weight gain. - Continue current medications: Pristiq  and buspirone. - Monitor for side effects, including weight gain.  General Health Maintenance Discussed dietary habits and potential impact on weight and blood sugar levels. Advised caution with high-sugar foods and alcohol consumption. - Advised moderation in consumption  of high-sugar foods and alcohol. - Encouraged regular physical activity and balanced diet.        Return in about 6 months (around 03/29/2025) for HTN, annual physical exam.    Heron CHRISTELLA Sharper, MD

## 2024-09-29 ENCOUNTER — Telehealth: Payer: Self-pay | Admitting: *Deleted

## 2024-10-04 ENCOUNTER — Other Ambulatory Visit: Payer: Self-pay | Admitting: Family Medicine

## 2024-10-04 DIAGNOSIS — F39 Unspecified mood [affective] disorder: Secondary | ICD-10-CM

## 2024-10-08 ENCOUNTER — Ambulatory Visit

## 2024-10-27 ENCOUNTER — Encounter: Payer: Self-pay | Admitting: Family Medicine

## 2024-11-08 ENCOUNTER — Encounter: Payer: Self-pay | Admitting: Family Medicine

## 2024-11-08 DIAGNOSIS — E876 Hypokalemia: Secondary | ICD-10-CM

## 2024-11-12 MED ORDER — POTASSIUM CHLORIDE 20 MEQ/15ML (10%) PO SOLN: 20.0000 meq | Freq: Every day | ORAL | 5 refills | Status: AC

## 2024-11-15 ENCOUNTER — Other Ambulatory Visit: Payer: Self-pay | Admitting: Family Medicine

## 2024-11-15 DIAGNOSIS — I10 Essential (primary) hypertension: Secondary | ICD-10-CM

## 2024-11-15 NOTE — Telephone Encounter (Signed)
 Notes appropriate to close.

## 2025-03-23 ENCOUNTER — Ambulatory Visit: Admitting: Neurology
